# Patient Record
Sex: Female | Born: 1950 | Race: White | Smoking: Former smoker
Health system: Southern US, Community
[De-identification: ages and names within clinical notes are randomized; demographics above are authoritative.]

## PROBLEM LIST (undated history)

## (undated) DIAGNOSIS — J189 Pneumonia, unspecified organism: Secondary | ICD-10-CM

## (undated) DIAGNOSIS — I499 Cardiac arrhythmia, unspecified: Secondary | ICD-10-CM

## (undated) DIAGNOSIS — G4730 Sleep apnea, unspecified: Secondary | ICD-10-CM

## (undated) DIAGNOSIS — M1990 Unspecified osteoarthritis, unspecified site: Secondary | ICD-10-CM

## (undated) DIAGNOSIS — E039 Hypothyroidism, unspecified: Secondary | ICD-10-CM

## (undated) DIAGNOSIS — D563 Thalassemia minor: Secondary | ICD-10-CM

## (undated) DIAGNOSIS — T4145XA Adverse effect of unspecified anesthetic, initial encounter: Secondary | ICD-10-CM

## (undated) DIAGNOSIS — F32A Depression, unspecified: Secondary | ICD-10-CM

## (undated) DIAGNOSIS — E119 Type 2 diabetes mellitus without complications: Secondary | ICD-10-CM

## (undated) DIAGNOSIS — I10 Essential (primary) hypertension: Secondary | ICD-10-CM

## (undated) DIAGNOSIS — K219 Gastro-esophageal reflux disease without esophagitis: Secondary | ICD-10-CM

## (undated) DIAGNOSIS — R519 Headache, unspecified: Secondary | ICD-10-CM

## (undated) DIAGNOSIS — J45909 Unspecified asthma, uncomplicated: Secondary | ICD-10-CM

## (undated) DIAGNOSIS — M041 Periodic fever syndromes: Secondary | ICD-10-CM

## (undated) DIAGNOSIS — D560 Alpha thalassemia: Secondary | ICD-10-CM

## (undated) DIAGNOSIS — R112 Nausea with vomiting, unspecified: Secondary | ICD-10-CM

## (undated) DIAGNOSIS — F329 Major depressive disorder, single episode, unspecified: Secondary | ICD-10-CM

## (undated) DIAGNOSIS — Z8719 Personal history of other diseases of the digestive system: Secondary | ICD-10-CM

## (undated) DIAGNOSIS — T8859XA Other complications of anesthesia, initial encounter: Secondary | ICD-10-CM

## (undated) HISTORY — PX: LEG AMPUTATION: SHX1105

## (undated) HISTORY — PX: ARTHROSCOPY KNEE W/ DRILLING: SUR92

## (undated) HISTORY — PX: HERNIA REPAIR: SHX51

## (undated) HISTORY — PX: KNEE SURGERY: SHX244

## (undated) HISTORY — PX: APPENDECTOMY: SHX54

## (undated) HISTORY — PX: CYST EXCISION: SHX5701

## (undated) HISTORY — PX: TOTAL SHOULDER ARTHROPLASTY: SHX126

## (undated) HISTORY — PX: TONSILLECTOMY: SUR1361

## (undated) HISTORY — PX: THIGH FASCIOTOMY: SHX2495

## (undated) HISTORY — PX: CHOLECYSTECTOMY: SHX55

---
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
## 1973-08-20 HISTORY — PX: CHOLECYSTECTOMY: SHX55

## 1977-08-20 DIAGNOSIS — Z9889 Other specified postprocedural states: Secondary | ICD-10-CM

## 1977-08-20 DIAGNOSIS — R112 Nausea with vomiting, unspecified: Secondary | ICD-10-CM

## 1977-08-20 HISTORY — DX: Other specified postprocedural states: R11.2

## 1977-08-20 HISTORY — DX: Nausea with vomiting, unspecified: Z98.890

## 1983-08-21 HISTORY — PX: EXPLORATORY LAPAROTOMY: SUR591

## 1983-08-21 HISTORY — PX: LAPAROTOMY: SHX154

## 2004-05-20 HISTORY — PX: REPLACEMENT TOTAL KNEE: SUR1224

## 2010-08-20 HISTORY — PX: LEG AMPUTATION: SHX1105

## 2019-01-27 ENCOUNTER — Other Ambulatory Visit: Payer: Self-pay | Admitting: Orthopaedic Surgery

## 2019-01-27 DIAGNOSIS — G8929 Other chronic pain: Secondary | ICD-10-CM

## 2019-01-27 DIAGNOSIS — M25512 Pain in left shoulder: Secondary | ICD-10-CM

## 2019-02-02 ENCOUNTER — Ambulatory Visit
Admission: RE | Admit: 2019-02-02 | Discharge: 2019-02-02 | Disposition: A | Discharge status: Home / Self Care | Payer: Medicare Other | Source: Ambulatory Visit | Attending: Orthopaedic Surgery | Admitting: Orthopaedic Surgery

## 2019-02-02 DIAGNOSIS — M25512 Pain in left shoulder: Secondary | ICD-10-CM

## 2019-02-02 DIAGNOSIS — G8929 Other chronic pain: Secondary | ICD-10-CM

## 2019-02-03 NOTE — Progress Notes (Signed)
Spoke with Judeen Hammans to request orders in epic.

## 2019-02-04 NOTE — Patient Instructions (Addendum)
Shirley SpragueBeverly Mcdonald    Your procedure is scheduled on: 02-11-2019  [facility]    Report to admitting at 955 AM   YOU NEED TO HAVE A COVID 19 TEST ON 02-07-19  @11 :05 AM, THIS TEST MUST BE DONE BEFORE SURGERY, [facility]. ONCE YOUR COVID TEST IS COMPLETED, PLEASE BEGIN THE QUARANTINE INSTRUCTIONS AS OUTLINED IN YOUR HANDOUT.   Call this number if you have problems the morning of surgery [phone]    Remember: BRUSH YOUR TEETH MORNING OF SURGERY AND RINSE YOUR MOUTH OUT, NO CHEWING GUM CANDY OR MINTS.   NO SOLID FOOD AFTER MIDNIGHT THE NIGHT PRIOR TO SURGERY. NOTHING BY MOUTH EXCEPT CLEAR LIQUIDS UNTIL 3 HOURS PRIOR TO SCHEULED SURGERY. PLEASE FINISH G2 DRINK PER SURGEON ORDER 3 HOURS PRIOR TO SCHEDULED SURGERY TIME WHICH NEEDS TO BE COMPLETED AT 925 AM.      CLEAR LIQUID DIET   Foods Allowed                                                                     Foods Excluded  Coffee and tea, regular and decaf                             liquids that you cannot  Plain Jell-O in any flavor                                             see through such as: Fruit ices (not with fruit pulp)                                     milk, soups, orange juice  Iced Popsicles                                    All solid food Carbonated beverages, regular and diet                                    Cranberry, grape and apple juices Sports drinks like Gatorade Lightly seasoned clear broth or consume(fat free) Sugar, honey syrup  Sample Menu Breakfast                                Lunch                                     Supper Cranberry juice                    Beef broth  Chicken broth Jell-O                                     Grape juice                           Apple juice Coffee or tea                        Jell-O                                      Popsicle                                                 Coffee or tea                        Coffee or tea  _____________________________________________________________________   Take these medicines the morning of surgery with A SIP OF WATER: LAMOTRIGENEN (LAMICTAL), PREGABALIN (LYRICA), BUPROPION (WELLBUTRIN), VENLAFAXINE XR ( EFFEXOR), PROPRANOL ER, COLCHICINE, LEVOTHYROXINE (SYNTHROID). YOU MAY BRING AND USE YOUR INHALER  DO NOT TAKE ANY DIABETIC MEDICATIONS DAY OF YOUR SURGERY       Reviewed and Endorsed by [facility] Patient Education Committee, August 2015                             You may not have any metal on your body including hair pins and              piercings  Do not wear jewelry, make-up, lotions, powders or perfumes, deodorant             Do not wear nail polish.  Do not shave  48 hours prior to surgery.                Do not bring valuables to the hospital. [facility] IS NOT             RESPONSIBLE   FOR VALUABLES.  Contacts, dentures or bridgework may not be worn into surgery.  Leave suitcase in the car. After surgery it may be brought to your room.     _____________________________________________________________________  How to Manage Your Diabetes Before and After Surgery  Why is it important to control my blood sugar before and after surgery? . Improving blood sugar levels before and after surgery helps healing and can limit problems. . A way of improving blood sugar control is eating a healthy diet by: o  Eating less sugar and carbohydrates o  Increasing activity/exercise o  Talking with your doctor about reaching your blood sugar goals . High blood sugars (greater than 180 mg/dL) can raise your risk of infections and slow your recovery, so you will need to focus on controlling your diabetes during the weeks before surgery. . Make sure that the doctor who takes care of your diabetes knows about your planned surgery including the date and location.  How do I manage my blood  sugar before surgery? . Check your blood sugar at least 4 times a day, starting 2 days  before surgery, to make sure that the level is not too high or low. o Check your blood sugar the morning of your surgery when you wake up and every 2 hours until you get to the Short Stay unit. . If your blood sugar is less than 70 mg/dL, you will need to treat for low blood sugar: o Do not take insulin. o Treat a low blood sugar (less than 70 mg/dL) with  cup of clear juice (cranberry or apple), 4 glucose tablets, OR glucose gel. o Recheck blood sugar in 15 minutes after treatment (to make sure it is greater than 70 mg/dL). If your blood sugar is not greater than 70 mg/dL on recheck, call [phone] for further instructions. . Report your blood sugar to the short stay nurse when you get to Short Stay.  . If you are admitted to the hospital after surgery: o Your blood sugar will be checked by the staff and you will probably be given insulin after surgery (instead of oral diabetes medicines) to make sure you have good blood sugar levels. o The goal for blood sugar control after surgery is 80-180 mg/dL.   WHAT DO I DO ABOUT MY DIABETES MEDICATION?  Marland Kitchen Do not take oral diabetes medicines (pills) the morning of surgery.  THE DAY  BEFORE SURGERY TAKE FULL MORNING LEVEMIR INSULIN DOSE, TAKE 1/2  EVENING LEVEMIR DOSE . THE MORNING OF SURGERY TAKE 1/2 DOSE OF LEVEMIR INSULIN  . The day before surgery take metformin as usual . The day of surgery do not take metformin .   THE DAY BEFORE SURGERY TAKE  regular meal times doses of lispro insulin, do not take bedtime dose of lispro (humalog)  .  the morning of surgery if CBG is greater than 220 mg/dL, you may take  of your sliding scale  . (correction) dose of lispro(humalog)  insulin               [place] - Preparing for Surgery Before surgery, you can play an important role.  Because skin is not sterile, your skin needs to be as free of germs as possible.   You can reduce the number of germs on your skin by washing with CHG (chlorahexidine gluconate) soap before surgery.  CHG is an antiseptic cleaner which kills germs and bonds with the skin to continue killing germs even after washing. Please DO NOT use if you have an allergy to CHG or antibacterial soaps.  If your skin becomes reddened/irritated stop using the CHG and inform your nurse when you arrive at Short Stay. Do not shave (including legs and underarms) for at least 48 hours prior to the first CHG shower.  You may shave your face/neck. Please follow these instructions carefully:  1.  Shower with CHG Soap the night before surgery and the  morning of Surgery.  2.  If you choose to wash your hair, wash your hair first as usual with your  normal  shampoo.  3.  After you shampoo, rinse your hair and body thoroughly to remove the  shampoo.                           4.  Use CHG as you would any other liquid soap.  You can apply chg directly  to the skin and wash                       Gently with  a scrungie or clean washcloth.  5.  Apply the CHG Soap to your body ONLY FROM THE NECK DOWN.   Do not use on face/ open                           Wound or open sores. Avoid contact with eyes, ears mouth and genitals (private parts).                       Wash face,  Genitals (private parts) with your normal soap.             6.  Wash thoroughly, paying special attention to the area where your surgery  will be performed.  7.  Thoroughly rinse your body with warm water from the neck down.  8.  DO NOT shower/wash with your normal soap after using and rinsing off  the CHG Soap.                9.  Pat yourself dry with a clean towel.            10.  Wear clean pajamas.            11.  Place clean sheets on your bed the night of your first shower and do not  sleep with pets. Day of Surgery : Do not apply any lotions/deodorants the morning of surgery.  Please wear clean clothes to the hospital/surgery  center.  FAILURE TO FOLLOW THESE INSTRUCTIONS MAY RESULT IN THE CANCELLATION OF YOUR SURGERY PATIENT SIGNATURE_________________________________  NURSE SIGNATURE__________________________________  ________________________________________________________________________   Rogelia MireIncentive Spirometer  An incentive spirometer is a tool that can help keep your lungs clear and active. This tool measures how well you are filling your lungs with each breath. Taking long deep breaths may help reverse or decrease the chance of developing breathing (pulmonary) problems (especially infection) following:  A long period of time when you are unable to move or be active. BEFORE THE PROCEDURE   If the spirometer includes an indicator to show your best effort, your nurse or respiratory therapist will set it to a desired goal.  If possible, sit up straight or lean slightly forward. Try not to slouch.  Hold the incentive spirometer in an upright position. INSTRUCTIONS FOR USE  1. Sit on the edge of your bed if possible, or sit up as far as you can in bed or on a chair. 2. Hold the incentive spirometer in an upright position. 3. Breathe out normally. 4. Place the mouthpiece in your mouth and seal your lips tightly around it. 5. Breathe in slowly and as deeply as possible, raising the piston or the ball toward the top of the column. 6. Hold your breath for 3-5 seconds or for as long as possible. Allow the piston or ball to fall to the bottom of the column. 7. Remove the mouthpiece from your mouth and breathe out normally. 8. Rest for a few seconds and repeat Steps 1 through 7 at least 10 times every 1-2 hours when you are awake. Take your time and take a few normal breaths between deep breaths. 9. The spirometer may include an indicator to show your best effort. Use the indicator as a goal to work toward during each repetition. 10. After each set of 10 deep breaths, practice coughing to be sure your lungs are  clear. If you have an incision (the cut made at the time of surgery), support  your incision when coughing by placing a pillow or rolled up towels firmly against it. Once you are able to get out of bed, walk around indoors and cough well. You may stop using the incentive spirometer when instructed by your caregiver.  RISKS AND COMPLICATIONS  Take your time so you do not get dizzy or light-headed.  If you are in pain, you may need to take or ask for pain medication before doing incentive spirometry. It is harder to take a deep breath if you are having pain. AFTER USE  Rest and breathe slowly and easily.  It can be helpful to keep track of a log of your progress. Your caregiver can provide you with a simple table to help with this. If you are using the spirometer at home, follow these instructions: [place] IF:   You are having difficultly using the spirometer.  You have trouble using the spirometer as often as instructed.  Your pain medication is not giving enough relief while using the spirometer.  You develop fever of 100.5 F (38.1 C) or higher. SEEK IMMEDIATE MEDICAL CARE IF:   You cough up bloody sputum that had not been present before.  You develop fever of 102 F (38.9 C) or greater.  You develop worsening pain at or near the incision site. MAKE SURE YOU:   Understand these instructions.  Will watch your condition.  Will get help right away if you are not doing well or get worse. Document Released: 12/17/2006 Document Revised: 10/29/2011 Document Reviewed: 02/17/2007 [facility] Patient Information 2014 [place], [place].   ________________________________________________________________________

## 2019-02-05 ENCOUNTER — Other Ambulatory Visit: Payer: Self-pay

## 2019-02-05 ENCOUNTER — Other Ambulatory Visit (HOSPITAL_COMMUNITY): Payer: Medicare Other

## 2019-02-05 ENCOUNTER — Encounter (HOSPITAL_COMMUNITY)
Admission: RE | Admit: 2019-02-05 | Discharge: 2019-02-05 | Disposition: A | Discharge status: Home / Self Care | Payer: Medicare Other | Source: Ambulatory Visit | Attending: Orthopaedic Surgery | Admitting: Orthopaedic Surgery

## 2019-02-05 ENCOUNTER — Encounter (HOSPITAL_COMMUNITY): Payer: Self-pay

## 2019-02-05 DIAGNOSIS — Z01818 Encounter for other preprocedural examination: Secondary | ICD-10-CM | POA: Diagnosis not present

## 2019-02-05 DIAGNOSIS — S42202A Unspecified fracture of upper end of left humerus, initial encounter for closed fracture: Secondary | ICD-10-CM | POA: Insufficient documentation

## 2019-02-05 DIAGNOSIS — E119 Type 2 diabetes mellitus without complications: Secondary | ICD-10-CM | POA: Diagnosis not present

## 2019-02-05 DIAGNOSIS — I10 Essential (primary) hypertension: Secondary | ICD-10-CM | POA: Diagnosis not present

## 2019-02-05 DIAGNOSIS — Z1159 Encounter for screening for other viral diseases: Secondary | ICD-10-CM | POA: Insufficient documentation

## 2019-02-05 DIAGNOSIS — X58XXXA Exposure to other specified factors, initial encounter: Secondary | ICD-10-CM | POA: Diagnosis not present

## 2019-02-05 HISTORY — DX: Other complications of anesthesia, initial encounter: T88.59XA

## 2019-02-05 HISTORY — DX: Alpha thalassemia: D56.0

## 2019-02-05 HISTORY — DX: Sleep apnea, unspecified: G47.30

## 2019-02-05 HISTORY — DX: Hypothyroidism, unspecified: E03.9

## 2019-02-05 HISTORY — DX: Headache, unspecified: R51.9

## 2019-02-05 HISTORY — DX: Essential (primary) hypertension: I10

## 2019-02-05 HISTORY — DX: Depression, unspecified: F32.A

## 2019-02-05 HISTORY — DX: Type 2 diabetes mellitus without complications: E11.9

## 2019-02-05 HISTORY — DX: Unspecified osteoarthritis, unspecified site: M19.90

## 2019-02-05 LAB — SURGICAL PCR SCREEN
MRSA, PCR: NEGATIVE
Staphylococcus aureus: POSITIVE — AB

## 2019-02-05 LAB — HEMOGLOBIN A1C
Hgb A1c MFr Bld: 4.5 % — ABNORMAL LOW (ref 4.8–5.6)
Mean Plasma Glucose: 82.45 mg/dL

## 2019-02-05 LAB — CBC
HCT: 32.7 % — ABNORMAL LOW (ref 36.0–46.0)
Hemoglobin: 10.8 g/dL — ABNORMAL LOW (ref 12.0–15.0)
MCH: 27.7 pg (ref 26.0–34.0)
MCHC: 33 g/dL (ref 30.0–36.0)
MCV: 83.8 fL (ref 80.0–100.0)
Platelets: 221 10*3/uL (ref 150–400)
RBC: 3.9 MIL/uL (ref 3.87–5.11)
RDW: 17.1 % — ABNORMAL HIGH (ref 11.5–15.5)
WBC: 7.5 10*3/uL (ref 4.0–10.5)
nRBC: 0 % (ref 0.0–0.2)

## 2019-02-05 LAB — GLUCOSE, CAPILLARY: Glucose-Capillary: 166 mg/dL — ABNORMAL HIGH (ref 70–99)

## 2019-02-05 LAB — BASIC METABOLIC PANEL
Anion gap: 11 (ref 5–15)
BUN: 12 mg/dL (ref 8–23)
CO2: 27 mmol/L (ref 22–32)
Calcium: 9.4 mg/dL (ref 8.9–10.3)
Chloride: 99 mmol/L (ref 98–111)
Creatinine, Ser: 0.46 mg/dL (ref 0.44–1.00)
GFR calc Af Amer: >60 mL/min (ref >60)
GFR calc non Af Amer: >60 mL/min (ref >60)
Glucose, Bld: 133 mg/dL — ABNORMAL HIGH (ref 70–99)
Potassium: 3.4 mmol/L — ABNORMAL LOW (ref 3.5–5.1)
Sodium: 137 mmol/L (ref 135–145)

## 2019-02-06 NOTE — Progress Notes (Signed)
02-05-19 PCR result routed to Dr. Griffin Basil for review

## 2019-02-07 ENCOUNTER — Other Ambulatory Visit (HOSPITAL_COMMUNITY)
Admission: RE | Admit: 2019-02-07 | Discharge: 2019-02-07 | Disposition: A | Discharge status: Home / Self Care | Payer: Medicare Other | Source: Ambulatory Visit | Attending: Orthopaedic Surgery | Admitting: Orthopaedic Surgery

## 2019-02-07 DIAGNOSIS — Z01818 Encounter for other preprocedural examination: Secondary | ICD-10-CM | POA: Diagnosis not present

## 2019-02-07 LAB — SARS CORONAVIRUS 2 (TAT 6-24 HRS): SARS Coronavirus 2: NEGATIVE

## 2019-02-11 ENCOUNTER — Encounter (HOSPITAL_COMMUNITY)
Admission: RE | Disposition: A | Discharge status: Home Health | Payer: Self-pay | Source: Home / Self Care | Attending: Orthopaedic Surgery

## 2019-02-11 ENCOUNTER — Inpatient Hospital Stay (HOSPITAL_COMMUNITY)
Admission: RE | Admit: 2019-02-11 | Discharge: 2019-02-12 | DRG: 483 | Disposition: A | Discharge status: Home Health | Payer: Medicare Other | Attending: Orthopaedic Surgery | Admitting: Orthopaedic Surgery

## 2019-02-11 ENCOUNTER — Inpatient Hospital Stay (HOSPITAL_COMMUNITY): Payer: Medicare Other | Admitting: Anesthesiology

## 2019-02-11 ENCOUNTER — Inpatient Hospital Stay (HOSPITAL_COMMUNITY): Payer: Medicare Other

## 2019-02-11 ENCOUNTER — Inpatient Hospital Stay (HOSPITAL_COMMUNITY): Payer: Medicare Other | Admitting: Physician Assistant

## 2019-02-11 ENCOUNTER — Other Ambulatory Visit: Payer: Self-pay

## 2019-02-11 ENCOUNTER — Encounter (HOSPITAL_COMMUNITY): Payer: Self-pay | Admitting: *Deleted

## 2019-02-11 DIAGNOSIS — S42202A Unspecified fracture of upper end of left humerus, initial encounter for closed fracture: Principal | ICD-10-CM | POA: Diagnosis present

## 2019-02-11 DIAGNOSIS — M659 Synovitis and tenosynovitis, unspecified: Secondary | ICD-10-CM | POA: Diagnosis present

## 2019-02-11 DIAGNOSIS — G4730 Sleep apnea, unspecified: Secondary | ICD-10-CM | POA: Diagnosis present

## 2019-02-11 DIAGNOSIS — D560 Alpha thalassemia: Secondary | ICD-10-CM | POA: Diagnosis present

## 2019-02-11 DIAGNOSIS — F329 Major depressive disorder, single episode, unspecified: Secondary | ICD-10-CM | POA: Diagnosis present

## 2019-02-11 DIAGNOSIS — E039 Hypothyroidism, unspecified: Secondary | ICD-10-CM | POA: Diagnosis present

## 2019-02-11 DIAGNOSIS — Z9049 Acquired absence of other specified parts of digestive tract: Secondary | ICD-10-CM

## 2019-02-11 DIAGNOSIS — Z791 Long term (current) use of non-steroidal anti-inflammatories (NSAID): Secondary | ICD-10-CM | POA: Diagnosis not present

## 2019-02-11 DIAGNOSIS — E119 Type 2 diabetes mellitus without complications: Secondary | ICD-10-CM | POA: Diagnosis present

## 2019-02-11 DIAGNOSIS — Z794 Long term (current) use of insulin: Secondary | ICD-10-CM

## 2019-02-11 DIAGNOSIS — Z7951 Long term (current) use of inhaled steroids: Secondary | ICD-10-CM

## 2019-02-11 DIAGNOSIS — Z87891 Personal history of nicotine dependence: Secondary | ICD-10-CM | POA: Diagnosis not present

## 2019-02-11 DIAGNOSIS — E11649 Type 2 diabetes mellitus with hypoglycemia without coma: Secondary | ICD-10-CM | POA: Diagnosis present

## 2019-02-11 DIAGNOSIS — Z7982 Long term (current) use of aspirin: Secondary | ICD-10-CM

## 2019-02-11 DIAGNOSIS — Z79890 Hormone replacement therapy (postmenopausal): Secondary | ICD-10-CM | POA: Diagnosis not present

## 2019-02-11 DIAGNOSIS — Z89611 Acquired absence of right leg above knee: Secondary | ICD-10-CM | POA: Diagnosis not present

## 2019-02-11 DIAGNOSIS — I10 Essential (primary) hypertension: Secondary | ICD-10-CM | POA: Diagnosis present

## 2019-02-11 DIAGNOSIS — X58XXXA Exposure to other specified factors, initial encounter: Secondary | ICD-10-CM | POA: Diagnosis present

## 2019-02-11 DIAGNOSIS — Z79899 Other long term (current) drug therapy: Secondary | ICD-10-CM | POA: Diagnosis not present

## 2019-02-11 DIAGNOSIS — M25519 Pain in unspecified shoulder: Secondary | ICD-10-CM

## 2019-02-11 DIAGNOSIS — Z09 Encounter for follow-up examination after completed treatment for conditions other than malignant neoplasm: Secondary | ICD-10-CM

## 2019-02-11 HISTORY — PX: TOTAL SHOULDER ARTHROPLASTY: SHX126

## 2019-02-11 HISTORY — PX: REVERSE SHOULDER ARTHROPLASTY: SHX5054

## 2019-02-11 LAB — GLUCOSE, CAPILLARY
Glucose-Capillary: 175 mg/dL — ABNORMAL HIGH (ref 70–99)
Glucose-Capillary: 156 mg/dL — ABNORMAL HIGH (ref 70–99)
Glucose-Capillary: 280 mg/dL — ABNORMAL HIGH (ref 70–99)

## 2019-02-11 SURGERY — ARTHROPLASTY, SHOULDER, TOTAL, REVERSE
Anesthesia: General | Site: Shoulder | Laterality: Left

## 2019-02-11 MED ORDER — DOCUSATE SODIUM 100 MG PO CAPS
100.0000 mg | ORAL_CAPSULE | Freq: Two times a day (BID) | ORAL | Status: DC
  Administered 2019-02-11 – 2019-02-12 (×2): 100 mg via ORAL
  Filled 2019-02-11 (×2): qty 1

## 2019-02-11 MED ORDER — SCOPOLAMINE 1 MG/3DAYS TD PT72: 1.0000 | MEDICATED_PATCH | Freq: Once | TRANSDERMAL | Status: DC

## 2019-02-11 MED ORDER — METFORMIN HCL ER 500 MG PO TB24
500.0000 mg | ORAL_TABLET | Freq: Two times a day (BID) | ORAL | Status: DC
  Administered 2019-02-12: 500 mg via ORAL
  Filled 2019-02-11: qty 1

## 2019-02-11 MED ORDER — BUPIVACAINE HCL (PF) 0.5 % IJ SOLN
INTRAMUSCULAR | Status: DC | PRN
  Administered 2019-02-11: 15 mL via PERINEURAL

## 2019-02-11 MED ORDER — SODIUM CHLORIDE 0.9 % IV SOLN
INTRAVENOUS | Status: DC | PRN
  Administered 2019-02-11: 35 ug/min via INTRAVENOUS

## 2019-02-11 MED ORDER — METOCLOPRAMIDE HCL 5 MG/ML IJ SOLN: 5.0000 mg | Freq: Three times a day (TID) | INTRAMUSCULAR | Status: DC | PRN

## 2019-02-11 MED ORDER — VANCOMYCIN HCL IN DEXTROSE 1-5 GM/200ML-% IV SOLN
1000.0000 mg | Freq: Once | INTRAVENOUS | Status: AC
  Administered 2019-02-11: 1000 mg via INTRAVENOUS

## 2019-02-11 MED ORDER — ONDANSETRON HCL 4 MG/2ML IJ SOLN: 4.0000 mg | Freq: Four times a day (QID) | INTRAMUSCULAR | Status: DC | PRN

## 2019-02-11 MED ORDER — LOSARTAN POTASSIUM 50 MG PO TABS
100.0000 mg | ORAL_TABLET | Freq: Every day | ORAL | Status: DC
  Administered 2019-02-12: 100 mg via ORAL
  Filled 2019-02-11: qty 2

## 2019-02-11 MED ORDER — BUPROPION HCL ER (XL) 300 MG PO TB24
300.0000 mg | ORAL_TABLET | Freq: Every day | ORAL | Status: DC
  Administered 2019-02-12: 11:00:00 300 mg via ORAL
  Filled 2019-02-11: qty 1

## 2019-02-11 MED ORDER — LIDOCAINE 2% (20 MG/ML) 5 ML SYRINGE
INTRAMUSCULAR | Status: DC | PRN
  Administered 2019-02-11: 20 mg via INTRAVENOUS

## 2019-02-11 MED ORDER — IBUPROFEN 400 MG PO TABS: 800.0000 mg | ORAL_TABLET | Freq: Four times a day (QID) | ORAL | Status: DC | PRN

## 2019-02-11 MED ORDER — SUGAMMADEX SODIUM 200 MG/2ML IV SOLN
INTRAVENOUS | Status: AC
  Filled 2019-02-11: qty 2

## 2019-02-11 MED ORDER — ONDANSETRON HCL 4 MG/2ML IJ SOLN
INTRAMUSCULAR | Status: AC
  Filled 2019-02-11: qty 2

## 2019-02-11 MED ORDER — ADULT MULTIVITAMIN W/MINERALS CH
1.0000 | ORAL_TABLET | Freq: Every day | ORAL | Status: DC
  Administered 2019-02-12: 11:00:00 1 via ORAL
  Filled 2019-02-11: qty 1

## 2019-02-11 MED ORDER — PANTOPRAZOLE SODIUM 40 MG PO TBEC
80.0000 mg | DELAYED_RELEASE_TABLET | Freq: Every day | ORAL | Status: DC
  Administered 2019-02-12: 80 mg via ORAL
  Filled 2019-02-11: qty 2

## 2019-02-11 MED ORDER — ROCURONIUM BROMIDE 50 MG/5ML IV SOSY
PREFILLED_SYRINGE | INTRAVENOUS | Status: DC | PRN
  Administered 2019-02-11: 50 mg via INTRAVENOUS

## 2019-02-11 MED ORDER — 0.9 % SODIUM CHLORIDE (POUR BTL) OPTIME
TOPICAL | Status: DC | PRN
  Administered 2019-02-11: 15:00:00 1000 mL

## 2019-02-11 MED ORDER — TRAZODONE HCL 50 MG PO TABS
50.0000 mg | ORAL_TABLET | Freq: Every day | ORAL | Status: DC
  Administered 2019-02-11: 50 mg via ORAL
  Filled 2019-02-11: qty 1

## 2019-02-11 MED ORDER — INSULIN DETEMIR 100 UNIT/ML ~~LOC~~ SOLN
5.0000 [IU] | Freq: Two times a day (BID) | SUBCUTANEOUS | Status: DC
  Administered 2019-02-12 (×2): 5 [IU] via SUBCUTANEOUS
  Filled 2019-02-11 (×3): qty 0.05

## 2019-02-11 MED ORDER — ASPIRIN BUF(CACARB-MGCARB-MGO) 81 MG PO TABS: 81.0000 mg | ORAL_TABLET | Freq: Every day | ORAL | Status: DC

## 2019-02-11 MED ORDER — SUGAMMADEX SODIUM 200 MG/2ML IV SOLN
INTRAVENOUS | Status: DC | PRN
  Administered 2019-02-11: 180 mg via INTRAVENOUS

## 2019-02-11 MED ORDER — LIDOCAINE 2% (20 MG/ML) 5 ML SYRINGE
INTRAMUSCULAR | Status: AC
  Filled 2019-02-11: qty 5

## 2019-02-11 MED ORDER — STERILE WATER FOR IRRIGATION IR SOLN
Status: DC | PRN
  Administered 2019-02-11: 2000 mL

## 2019-02-11 MED ORDER — SODIUM CHLORIDE 0.9 % IR SOLN
Status: DC | PRN
  Administered 2019-02-11: 1000 mL

## 2019-02-11 MED ORDER — INSULIN LISPRO (1 UNIT DIAL) 100 UNIT/ML (KWIKPEN): 1.0000 [IU] | PEN_INJECTOR | Freq: Four times a day (QID) | SUBCUTANEOUS | Status: DC

## 2019-02-11 MED ORDER — FENTANYL CITRATE (PF) 100 MCG/2ML IJ SOLN
INTRAMUSCULAR | Status: AC
  Filled 2019-02-11: qty 2

## 2019-02-11 MED ORDER — ALBUTEROL SULFATE (2.5 MG/3ML) 0.083% IN NEBU: 2.5000 mg | INHALATION_SOLUTION | Freq: Four times a day (QID) | RESPIRATORY_TRACT | Status: DC | PRN

## 2019-02-11 MED ORDER — PROMETHAZINE HCL 25 MG/ML IJ SOLN: 6.2500 mg | INTRAMUSCULAR | Status: DC | PRN

## 2019-02-11 MED ORDER — TOBRAMYCIN SULFATE 1.2 G IJ SOLR
INTRAMUSCULAR | Status: AC
  Filled 2019-02-11: qty 1.2

## 2019-02-11 MED ORDER — DEXAMETHASONE SODIUM PHOSPHATE 10 MG/ML IJ SOLN
INTRAMUSCULAR | Status: AC
  Filled 2019-02-11: qty 1

## 2019-02-11 MED ORDER — PHENYLEPHRINE 40 MCG/ML (10ML) SYRINGE FOR IV PUSH (FOR BLOOD PRESSURE SUPPORT)
PREFILLED_SYRINGE | INTRAVENOUS | Status: DC | PRN
  Administered 2019-02-11 (×2): 80 ug via INTRAVENOUS

## 2019-02-11 MED ORDER — MIDAZOLAM HCL 2 MG/2ML IJ SOLN
INTRAMUSCULAR | Status: AC
  Filled 2019-02-11: qty 2

## 2019-02-11 MED ORDER — ATORVASTATIN CALCIUM 20 MG PO TABS
20.0000 mg | ORAL_TABLET | Freq: Every evening | ORAL | Status: DC
  Administered 2019-02-11: 20 mg via ORAL
  Filled 2019-02-11: qty 1

## 2019-02-11 MED ORDER — LAMOTRIGINE 25 MG PO TABS
150.0000 mg | ORAL_TABLET | Freq: Every day | ORAL | Status: DC
  Administered 2019-02-11: 150 mg via ORAL
  Filled 2019-02-11: qty 1

## 2019-02-11 MED ORDER — FENTANYL CITRATE (PF) 100 MCG/2ML IJ SOLN
50.0000 ug | Freq: Once | INTRAMUSCULAR | Status: AC
  Administered 2019-02-11: 50 ug via INTRAVENOUS

## 2019-02-11 MED ORDER — ACETAMINOPHEN 500 MG PO TABS
1000.0000 mg | ORAL_TABLET | Freq: Three times a day (TID) | ORAL | Status: DC
  Administered 2019-02-11 – 2019-02-12 (×2): 1000 mg via ORAL
  Filled 2019-02-11 (×2): qty 2

## 2019-02-11 MED ORDER — VANCOMYCIN HCL POWD
Status: DC | PRN
  Administered 2019-02-11: 1000 mg via TOPICAL

## 2019-02-11 MED ORDER — LOSARTAN POTASSIUM-HCTZ 100-25 MG PO TABS: 1.0000 | ORAL_TABLET | Freq: Every day | ORAL | Status: DC

## 2019-02-11 MED ORDER — PREGABALIN 75 MG PO CAPS
150.0000 mg | ORAL_CAPSULE | Freq: Two times a day (BID) | ORAL | Status: DC
  Administered 2019-02-11 – 2019-02-12 (×2): 150 mg via ORAL
  Filled 2019-02-11 (×2): qty 2

## 2019-02-11 MED ORDER — BUPIVACAINE LIPOSOME 1.3 % IJ SUSP
INTRAMUSCULAR | Status: DC | PRN
  Administered 2019-02-11: 10 mL via PERINEURAL

## 2019-02-11 MED ORDER — VENLAFAXINE HCL ER 150 MG PO CP24
300.0000 mg | ORAL_CAPSULE | Freq: Every day | ORAL | Status: DC
  Administered 2019-02-12: 300 mg via ORAL
  Filled 2019-02-11: qty 2

## 2019-02-11 MED ORDER — CELECOXIB 200 MG PO CAPS
200.0000 mg | ORAL_CAPSULE | Freq: Two times a day (BID) | ORAL | Status: DC
  Administered 2019-02-11 – 2019-02-12 (×2): 200 mg via ORAL
  Filled 2019-02-11 (×2): qty 1

## 2019-02-11 MED ORDER — VITAMIN D 25 MCG (1000 UNIT) PO TABS: 5000.0000 [IU] | ORAL_TABLET | Freq: Every evening | ORAL | Status: DC

## 2019-02-11 MED ORDER — KRILL OIL 500 MG PO CAPS: 500.0000 mg | ORAL_CAPSULE | Freq: Every day | ORAL | Status: DC

## 2019-02-11 MED ORDER — TRANEXAMIC ACID-NACL 1000-0.7 MG/100ML-% IV SOLN
1000.0000 mg | INTRAVENOUS | Status: AC
  Administered 2019-02-11: 1000 mg via INTRAVENOUS
  Filled 2019-02-11: qty 100

## 2019-02-11 MED ORDER — LACTATED RINGERS IV SOLN
INTRAVENOUS | Status: DC
  Administered 2019-02-11 (×2): via INTRAVENOUS

## 2019-02-11 MED ORDER — SUCCINYLCHOLINE CHLORIDE 200 MG/10ML IV SOSY
PREFILLED_SYRINGE | INTRAVENOUS | Status: DC | PRN
  Administered 2019-02-11: 100 mg via INTRAVENOUS

## 2019-02-11 MED ORDER — ONDANSETRON HCL 4 MG PO TABS: 4.0000 mg | ORAL_TABLET | Freq: Four times a day (QID) | ORAL | Status: DC | PRN

## 2019-02-11 MED ORDER — VANCOMYCIN HCL IN DEXTROSE 1-5 GM/200ML-% IV SOLN
INTRAVENOUS | Status: AC
  Filled 2019-02-11: qty 200

## 2019-02-11 MED ORDER — ONDANSETRON HCL 4 MG/2ML IJ SOLN
INTRAMUSCULAR | Status: DC | PRN
  Administered 2019-02-11: 4 mg via INTRAVENOUS

## 2019-02-11 MED ORDER — TOBRAMYCIN SULFATE 1.2 G IJ SOLR
INTRAMUSCULAR | Status: DC | PRN
  Administered 2019-02-11: 1.2 g via TOPICAL

## 2019-02-11 MED ORDER — MIDAZOLAM HCL 2 MG/2ML IJ SOLN
1.0000 mg | Freq: Once | INTRAMUSCULAR | Status: AC
  Administered 2019-02-11: 1 mg via INTRAVENOUS

## 2019-02-11 MED ORDER — INSULIN ASPART 100 UNIT/ML ~~LOC~~ SOLN
0.0000 [IU] | Freq: Three times a day (TID) | SUBCUTANEOUS | Status: DC
  Administered 2019-02-12: 4 [IU] via SUBCUTANEOUS
  Administered 2019-02-12: 3 [IU] via SUBCUTANEOUS

## 2019-02-11 MED ORDER — CEFAZOLIN SODIUM-DEXTROSE 2-4 GM/100ML-% IV SOLN
2.0000 g | INTRAVENOUS | Status: AC
  Administered 2019-02-11: 2 g via INTRAVENOUS
  Filled 2019-02-11: qty 100

## 2019-02-11 MED ORDER — MEPERIDINE HCL 50 MG/ML IJ SOLN: 6.2500 mg | INTRAMUSCULAR | Status: DC | PRN

## 2019-02-11 MED ORDER — LEVOTHYROXINE SODIUM 50 MCG PO TABS
50.0000 ug | ORAL_TABLET | Freq: Every day | ORAL | Status: DC
  Administered 2019-02-12: 50 ug via ORAL
  Filled 2019-02-11: qty 1

## 2019-02-11 MED ORDER — PROPOFOL 10 MG/ML IV BOLUS
INTRAVENOUS | Status: DC | PRN
  Administered 2019-02-11: 200 mg via INTRAVENOUS

## 2019-02-11 MED ORDER — PHENYLEPHRINE HCL (PRESSORS) 10 MG/ML IV SOLN
INTRAVENOUS | Status: AC
  Filled 2019-02-11: qty 1

## 2019-02-11 MED ORDER — PROPOFOL 10 MG/ML IV BOLUS
INTRAVENOUS | Status: AC
  Filled 2019-02-11: qty 20

## 2019-02-11 MED ORDER — OXYCODONE HCL 5 MG PO TABS: 5.0000 mg | ORAL_TABLET | ORAL | Status: DC | PRN

## 2019-02-11 MED ORDER — HYDROCHLOROTHIAZIDE 25 MG PO TABS
25.0000 mg | ORAL_TABLET | Freq: Every day | ORAL | Status: DC
  Administered 2019-02-12: 25 mg via ORAL
  Filled 2019-02-11: qty 1

## 2019-02-11 MED ORDER — COLCHICINE 0.6 MG PO TABS
0.6000 mg | ORAL_TABLET | Freq: Two times a day (BID) | ORAL | Status: DC
  Administered 2019-02-11 – 2019-02-12 (×2): 0.6 mg via ORAL
  Filled 2019-02-11 (×2): qty 1

## 2019-02-11 MED ORDER — CEFAZOLIN SODIUM-DEXTROSE 1-4 GM/50ML-% IV SOLN
1.0000 g | Freq: Four times a day (QID) | INTRAVENOUS | Status: AC
  Administered 2019-02-11 – 2019-02-12 (×3): 1 g via INTRAVENOUS
  Filled 2019-02-11 (×3): qty 50

## 2019-02-11 MED ORDER — DIPHENHYDRAMINE HCL 12.5 MG/5ML PO ELIX: 12.5000 mg | ORAL_SOLUTION | ORAL | Status: DC | PRN

## 2019-02-11 MED ORDER — SUCCINYLCHOLINE CHLORIDE 200 MG/10ML IV SOSY
PREFILLED_SYRINGE | INTRAVENOUS | Status: AC
  Filled 2019-02-11: qty 10

## 2019-02-11 MED ORDER — CHLORHEXIDINE GLUCONATE 4 % EX LIQD: 60.0000 mL | Freq: Once | CUTANEOUS | Status: DC

## 2019-02-11 MED ORDER — DEXAMETHASONE SODIUM PHOSPHATE 10 MG/ML IJ SOLN
INTRAMUSCULAR | Status: DC | PRN
  Administered 2019-02-11: 10 mg via INTRAVENOUS

## 2019-02-11 MED ORDER — PROPRANOLOL HCL ER 60 MG PO CP24
60.0000 mg | ORAL_CAPSULE | Freq: Every day | ORAL | Status: DC
  Administered 2019-02-11: 60 mg via ORAL
  Filled 2019-02-11: qty 1

## 2019-02-11 MED ORDER — VANCOMYCIN HCL 1000 MG IV SOLR
INTRAVENOUS | Status: AC
  Filled 2019-02-11: qty 1000

## 2019-02-11 MED ORDER — RISAQUAD PO CAPS
1.0000 | ORAL_CAPSULE | Freq: Every day | ORAL | Status: DC
  Administered 2019-02-12: 11:00:00 1 via ORAL
  Filled 2019-02-11: qty 1

## 2019-02-11 MED ORDER — FENTANYL CITRATE (PF) 100 MCG/2ML IJ SOLN
INTRAMUSCULAR | Status: DC | PRN
  Administered 2019-02-11 (×2): 50 ug via INTRAVENOUS

## 2019-02-11 MED ORDER — MIDAZOLAM HCL 2 MG/2ML IJ SOLN: 0.5000 mg | Freq: Once | INTRAMUSCULAR | Status: DC | PRN

## 2019-02-11 MED ORDER — ASPIRIN 81 MG PO CHEW
81.0000 mg | CHEWABLE_TABLET | Freq: Every day | ORAL | Status: DC
  Administered 2019-02-12: 81 mg via ORAL
  Filled 2019-02-11: qty 1

## 2019-02-11 MED ORDER — METOCLOPRAMIDE HCL 5 MG PO TABS: 5.0000 mg | ORAL_TABLET | Freq: Three times a day (TID) | ORAL | Status: DC | PRN

## 2019-02-11 MED ORDER — ZOLPIDEM TARTRATE 5 MG PO TABS: 5.0000 mg | ORAL_TABLET | Freq: Every evening | ORAL | Status: DC | PRN

## 2019-02-11 MED ORDER — ROCURONIUM BROMIDE 10 MG/ML (PF) SYRINGE
PREFILLED_SYRINGE | INTRAVENOUS | Status: AC
  Filled 2019-02-11: qty 10

## 2019-02-11 MED ORDER — CALCIUM CARBONATE-VITAMIN D 500-200 MG-UNIT PO TABS
1.0000 | ORAL_TABLET | Freq: Two times a day (BID) | ORAL | Status: DC
  Administered 2019-02-11 – 2019-02-12 (×2): 1 via ORAL
  Filled 2019-02-11 (×2): qty 1

## 2019-02-11 MED ORDER — FENTANYL CITRATE (PF) 100 MCG/2ML IJ SOLN: 25.0000 ug | INTRAMUSCULAR | Status: DC | PRN

## 2019-02-11 MED ORDER — MORPHINE SULFATE (PF) 2 MG/ML IV SOLN: 2.0000 mg | INTRAVENOUS | Status: DC | PRN

## 2019-02-11 SURGICAL SUPPLY — 67 items
BASEPLATE GLENOSPHERE 25 STD (Miscellaneous) ×2 IMPLANT
BENZOIN TINCTURE PRP APPL 2/3 (GAUZE/BANDAGES/DRESSINGS) ×2 IMPLANT
BIT DRILL 3.2 PERIPHERAL SCREW (BIT) ×2 IMPLANT
BLADE EXTENDED COATED 6.5IN (ELECTRODE) IMPLANT
BLADE SAW SAG 73X25 THK (BLADE) ×1
BLADE SAW SGTL 73X25 THK (BLADE) ×1 IMPLANT
BODY PROXIMAL PTC 13X132.5 (Joint) ×1 IMPLANT
CAP LOCKING COCR (Cap) ×2 IMPLANT
CHLORAPREP W/TINT 26 (MISCELLANEOUS) ×4 IMPLANT
CLSR STERI-STRIP ANTIMIC 1/2X4 (GAUZE/BANDAGES/DRESSINGS) ×2 IMPLANT
COOLER ICEMAN CLASSIC (MISCELLANEOUS) ×2 IMPLANT
COVER SURGICAL LIGHT HANDLE (MISCELLANEOUS) ×2 IMPLANT
COVER WAND RF STERILE (DRAPES) ×2 IMPLANT
DRAPE INCISE IOBAN 66X45 STRL (DRAPES) ×2 IMPLANT
DRAPE ORTHO SPLIT 77X108 STRL (DRAPES) ×2
DRAPE SHEET LG 3/4 BI-LAMINATE (DRAPES) ×2 IMPLANT
DRAPE SURG ORHT 6 SPLT 77X108 (DRAPES) ×2 IMPLANT
DRSG AQUACEL AG ADV 3.5X 6 (GAUZE/BANDAGES/DRESSINGS) ×2 IMPLANT
ELECT BLADE TIP CTD 4 INCH (ELECTRODE) ×2 IMPLANT
ELECT REM PT RETURN 15FT ADLT (MISCELLANEOUS) ×2 IMPLANT
GLENOSPHERE REV SHOULDER 36 (Joint) ×2 IMPLANT
GLOVE BIO SURGEON STRL SZ8 (GLOVE) ×2 IMPLANT
GLOVE BIOGEL PI IND STRL 8 (GLOVE) ×2 IMPLANT
GLOVE BIOGEL PI INDICATOR 8 (GLOVE) ×2
GLOVE ECLIPSE 8.0 STRL XLNG CF (GLOVE) ×4 IMPLANT
GOWN SPEC L3 XXLG W/TWL (GOWN DISPOSABLE) ×2 IMPLANT
GOWN STRL REUS W/ TWL XL LVL3 (GOWN DISPOSABLE) ×1 IMPLANT
GOWN STRL REUS W/TWL XL LVL3 (GOWN DISPOSABLE) ×1
GUIDEWIRE GLENOID 2.5X220 (WIRE) ×2 IMPLANT
HANDPIECE INTERPULSE COAX TIP (DISPOSABLE) ×1
HEMOSTAT SURGICEL 2X14 (HEMOSTASIS) IMPLANT
IMPL REVERSE SHOULDER 0X3.5 (Shoulder) ×1 IMPLANT
IMPLANT REVERSE SHOULDER 0X3.5 (Shoulder) ×2 IMPLANT
INSERT REV KIT SHOULDER 9X36 (Joint) ×2 IMPLANT
KIT BASIN OR (CUSTOM PROCEDURE TRAY) ×2 IMPLANT
KIT STABILIZATION SHOULDER (MISCELLANEOUS) ×2 IMPLANT
KIT TURNOVER KIT A (KITS) IMPLANT
MANIFOLD NEPTUNE II (INSTRUMENTS) ×2 IMPLANT
NEEDLE HYPO 25X1 1.5 SAFETY (NEEDLE) IMPLANT
NEEDLE MAYO CATGUT SZ4 (NEEDLE) IMPLANT
NS IRRIG 1000ML POUR BTL (IV SOLUTION) ×2 IMPLANT
PACK SHOULDER (CUSTOM PROCEDURE TRAY) ×2 IMPLANT
PAD COLD SHLDR WRAP-ON (PAD) ×2 IMPLANT
PROXIMAL BODY PTC 13X132.5 (Joint) ×2 IMPLANT
PRT COAT DISTL STEM 13 SHOUL (Miscellaneous) ×2 IMPLANT
RESTRAINT HEAD UNIVERSAL NS (MISCELLANEOUS) ×2 IMPLANT
SCREW 5.0X38 SMALL F/PERFORM (Screw) ×2 IMPLANT
SCREW ASSEMBLY COCR TYPE 0 (Screw) ×2 IMPLANT
SCREW BONE 6.5X40 SM (Screw) ×2 IMPLANT
SCREW PERIPHERAL 5.0X34 (Screw) ×2 IMPLANT
SET HNDPC FAN SPRY TIP SCT (DISPOSABLE) ×1 IMPLANT
SLING ULTRA II L (ORTHOPEDIC SUPPLIES) ×2 IMPLANT
SLING ULTRA III MED (ORTHOPEDIC SUPPLIES) ×2 IMPLANT
SPONGE LAP 18X18 X RAY DECT (DISPOSABLE) IMPLANT
STEM SHLDR DIST PRTLY COATD 13 (Miscellaneous) ×1 IMPLANT
STRIP CLOSURE SKIN 1/2X4 (GAUZE/BANDAGES/DRESSINGS) ×2 IMPLANT
SUCTION FRAZIER HANDLE 12FR (TUBING) ×1
SUCTION TUBE FRAZIER 12FR DISP (TUBING) ×1 IMPLANT
SUT ETHIBOND 2 V 37 (SUTURE) ×2 IMPLANT
SUT ETHIBOND NAB CT1 #1 30IN (SUTURE) ×2 IMPLANT
SUT FIBERWIRE #5 38 CONV NDL (SUTURE) ×8
SUT MON AB 3-0 SH 27 (SUTURE) ×1
SUT MON AB 3-0 SH27 (SUTURE) ×1 IMPLANT
SUT VIC AB 0 CT1 36 (SUTURE) ×2 IMPLANT
SUTURE FIBERWR #5 38 CONV NDL (SUTURE) ×4 IMPLANT
TOWEL OR 17X26 10 PK STRL BLUE (TOWEL DISPOSABLE) ×2 IMPLANT
WATER STERILE IRR 1000ML POUR (IV SOLUTION) ×4 IMPLANT

## 2019-02-11 NOTE — Anesthesia Postprocedure Evaluation (Signed)
Anesthesia Post Note  Patient: Insurance claims handler  Procedure(s) Performed: REVERSE SHOULDER ARTHROPLASTY (Left Shoulder)     Patient location during evaluation: PACU Anesthesia Type: General Level of consciousness: awake and alert Pain management: pain level controlled Vital Signs Assessment: post-procedure vital signs reviewed and stable Respiratory status: spontaneous breathing, nonlabored ventilation, respiratory function stable and patient connected to nasal cannula oxygen Cardiovascular status: blood pressure returned to baseline and stable Postop Assessment: no apparent nausea or vomiting Anesthetic complications: no    Last Vitals:  Vitals:   02/11/19 1650 02/11/19 1700  BP: 130/69 105/76  Pulse:  78  Resp:  19  Temp:    SpO2:  100%    Last Pain:  Vitals:   02/11/19 1649  TempSrc:   PainSc: 0-No pain                 , DAVID

## 2019-02-11 NOTE — Op Note (Addendum)
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  Patient was about 5 weeks out from injury and had a semi-healed fracture though she had a clear intra-articular step-off and her tuberosities were only partially healed.  The lesser seemed healed enough and we were able to do a peel type management option for the subscap.  The greater was only partially healed but we were able to reinforce this with sutures and cerclage is in place.  We had to use a long diaphyseal fit stem secondary to her calcar being very weak and we were not able to get appropriate purchase with a short stem.  We also were worried about fixation as the patient would likely use the arm sooner than most secondary to her above-knee amputation.  Is at a high risk for infection as she had a history of infection of the lower extremity but nothing in the left shoulder.  No clinical sign of infection at the time of surgery.  Implants: Tornier 13 mm revive partially coated with 13 mm proximal body.  0 high, +9 poly-, 25 baseplate with standard 36 glenosphere  Post-operative plan: The patient will be NWB in sling.  The patient will be admitted overnight.  DVT prophylaxis not indicated in isolated upper extremity surgery patient with no specific risks factors.  Pain control with PRN pain medication preferring oral medicines.  Follow up plan will be scheduled in approximately 7 days for incision check and XR.  No PT for arm but okay to transfer transfers.  Post-Op Diagnosis: Same Surgeons:Primary: Hiram Gash, MD Assistants:Brandon Lynnell Jude Location: [facility] ROOM 06 Anesthesia: General Antibiotics: Ancef 2g preop, Vancomycin 1035m locally, tobramycin 1.2 g Tourniquet time:  None Estimated Blood Loss: 1270Complications: None Specimens: None Implants: Implant Name Type Inv. Item Serial No. Manufacturer Lot No. LRB No. Used Action  BASEPLATE GLENOSPHERE 262BJSTD - [id number] BASEPLATE GLENOSPHERE 260VPSTD [id number]  Left 1 Implanted  SCREW BONE 6.5X40 SM - [id number] SCREW BONE 6.5X40 SM  [employer]  Left 1 Implanted  SCREW 5.0X38 SMALL F/PERFORM - [id number] SCREW 5.0X38 SMALL F/PERFORM  [employer]  Left 1 Implanted  SCREW PERIPHERAL 5.0X34 - [id number] SCREW PERIPHERAL 5.0X34  [employer]  Left 1 Implanted  GLENOSPHERE REV SHOULDER 36 - [id number] GLENOSPHERE REV SHOULDER 36  [employer]  Left 1 Implanted  IMPLANT REVERSE SHOULDER 0X3.5 - [id number] IMPLANT REVERSE SHOULDER 0X3.5 [id number]  Left 1 Implanted  INSERT REV KIT SHOULDER 9X36 - [id number] INSERT REV KIT SHOULDER 9X36 [id number]  Left 1 Implanted  CAP LOCKING COCR - [id number] CAP LOCKING COCR [id number] [employer]  Left 1 Implanted  SCREW ASSEMBLY COCR TYPE 0 - [id number] SCREW ASSEMBLY COCR TYPE 0 [id number] [employer]  Left 1 Implanted  AWQUALIS FLEX REVIVE PTC PROXIMAL BODY   [id number]  Left 1 Implanted  AEQUALIS FLEX REVIVE PARTIALLY COATED DISTAL STEM   [id number] [employer]  Left 1 Implanted    Indications for Surgery:   BLekia Mcdonald a 68y.o. 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  Unfortunately the patient had an  intra-articular fracture and relies on her left upper extremity for ambulation purposes and transfers as she is an amputee.  She requires good function of her left shoulder we felt that going for reverse shoulder arthroplasty even in the setting of previous infection in her lower extremity was appropriate due to her inability to mobilize  without this.  Benefits and risks of operative and nonoperative management were discussed prior to surgery with patient/guardian(s) and informed consent form was completed.  Infection and need for further surgery were discussed as was prosthetic stability and cuff issues.  We additionally specifically discussed risks of axillary nerve injury, infection, periprosthetic fracture, continued pain and longevity of implants prior to beginning procedure.      Procedure:   The patient was identified in the preoperative holding area where the surgical site was marked. Block placed by anesthesia with exparel.  The patient was taken to the OR where a procedural timeout was called and the above noted anesthesia was induced.  The patient was positioned beachchair on allen table with spider arm positioner.  Preoperative antibiotics were dosed.  The patient's left shoulder was prepped and draped in the usual sterile fashion.  A second preoperative timeout was called.       Standard deltopectoral approach was performed with a #10 blade. We dissected down to the subcutaneous tissues and the cephalic vein was taken laterally with the deltoid. Clavipectoral fascia was incised in line with the incision. Deep retractors were placed. The long of the biceps tendon was identified and there was significant tenosynovitis present.  Tenodesis was performed to the pectoralis tendon with #2 Ethibond. The remaining biceps was followed up into the rotator interval where it was released.    We able to perform a subscap peel as the lesser was relatively healed.  The greater was not particularly healed and there was clear intra-articular step-off of the head.  We performed a head cut as is typical and used a rondure to debride back callus around the greater but we were able to reinforce the partially healing posterior cuff to the shaft with FiberWire sutures that we then would eventually pass around the stem and a cerclage fashion to the  subscapularis.  We were happy with our skeletonization of the proximal humerus.    We then released the SGHL with bovie cautery prior to placing a curved mayo at the junction of the anterior glenoid well above the axillary nerve and bluntly dissecting the subscapularis from the capsule.  We then carefully protected the axillary nerve as we gently released the inferior capsule to fully mobilize the subscapularis.  An anterior deltoid retractor was then placed as well as a small Hohmann retractor superiorly.   The glenoid was significantly damaged as the patient had a baseline history of arthritis.  The remaining labrum was removed circumferentially taking great care not to disrupt the posterior capsule.    The glenoid drill guide was placed and used to drill a guide pin in the center, inferior position. The glenoid face was then reamed concentrically over the guide wire. The center hole was drilled over the guidepin in a near anatomic angle of version. Next the glenoid vault was drilled back to a depth of  40 mm.  We tapped and then placed a 25 mm size baseplate with 0 lateralization was selected with a 40 mm x 6.5 mm length central screw.  The base plate was screwed into the glenoid vault obtaining secure fixation. We next placed superior and inferior  locking screws for additional fixation.  Next a 36 mm glenosphere was selected and impacted onto the baseplate. The center screw was tightened.   We then repositioned the arm to give access to the humeral shaft fragment.    We attempted to use the longstem flex however the calcar was damaged and this not provide rotational stability. We broached with the Revive stem implants  starting with a size 9 reamer and reaming up to 13 which obtained an appropriate fit.  The proximal body was sized separately and attached to trial and achieve a stable articulation.    We trialed with multiple size tray and polyethylene options and selected a 0 high which provided good  stability and range of motion without excess soft tissue tension. The offset was dialed in to match the normal anatomy. The shoulder was trialed.  There was good ROM in all planes and the shoulder was stable with no inferior translation.   We then mobilized her tuberosities again and placed the anterior deep limbs of the 4 #5 fiber wires around the stem.  1 of these was tied down fixing the greater tuberosity in place after bone graft harvest from the humeral head component was placed underneath.  A +0 high offset tray was selected and impacted onto the stem.   A 36+9 polyethylene liner was impacted onto the stem.  The joint was reduced and thoroughly irrigated with pulsatile lavage. The remaining sutures were then placed through the subscapularis and the bone tendon junction and the tuberosities were reduced after bone graft placed beneath as autograft at the subscap.  We horizontally secured the tuberosities before placing vertical fixation with the suture that was placed into the shaft.  Tuberosities moved as a unit were happy with her overall reduction.  This was checked on fluoroscopy confirming our position.  We irrigated copiously at this point.  Hemostasis was obtained. The deltopectoral interval was reapproximated with #1 Ethibond. The subcutaneous tissues were closed with 3-0 Vicryl and the skin was closed with running monocryl.     The wounds were cleaned and dried and an Aquacel dressing was placed. The drapes taken down. The arm was placed into sling with abduction pillow. Patient was awakened, extubated, and transferred to the recovery room in stable condition. There were no intraoperative complications. The sponge, needle, and attention counts were correct at the end of the case.   Joya Gaskins, OPA-C, present and scrubbed throughout the case, critical for completion in a timely fashion, and for retraction, instrumentation, closure.

## 2019-02-11 NOTE — Progress Notes (Signed)
Daughter called into PACU for update, informed surgery had not begun as of now, gave pre-op number for family to get update.

## 2019-02-11 NOTE — Progress Notes (Signed)
Assisted Dr. Carswell Jackson with left, ultrasound guided, interscalene  block. Side rails up, monitors on throughout procedure. See vital signs in flow sheet. Tolerated Procedure well. °

## 2019-02-11 NOTE — H&P (Signed)
PREOPERATIVE H&P  Chief Complaint: left proximal humerus fracture  HPI: Shirley Mcdonald is a 68 y.o. female who presents for preoperative history and physical with a diagnosis of left proximal humerus fracture. Symptoms are rated as moderate to severe, and have been worsening.  This is significantly impairing activities of daily living.  Please see my clinic note for full details on this patient's care.  She has elected for surgical management.   Past Medical History:  Diagnosis Date  . Alpha (0) thalassemia (HCC)    Minor  . Arthritis   . Complication of anesthesia    Combative  . Depression   . Diabetes mellitus without complication ([place])   . Headache   . Hypertension   . Hypothyroidism   . PONV (postoperative nausea and vomiting) 1979  . Sleep apnea    Past Surgical History:  Procedure Laterality Date  . APPENDECTOMY    . ARTHROSCOPY KNEE W/ DRILLING    . CESAREAN SECTION     x2  . CHOLECYSTECTOMY    . EXPLORATORY LAPAROTOMY  1985  . HERNIA REPAIR     x3  . LEG AMPUTATION     AKA  . TONSILLECTOMY     Social History   Socioeconomic History  . Marital status: Unknown    Spouse name: Not on file  . Number of children: Not on file  . Years of education: Not on file  . Highest education level: Not on file  Occupational History  . Not on file  Social Needs  . Financial resource strain: Not on file  . Food insecurity    Worry: Not on file    Inability: Not on file  . Transportation needs    Medical: Not on file    Non-medical: Not on file  Tobacco Use  . Smoking status: Former Smoker    Quit date: 2015    Years since quitting: 5.4  . Smokeless tobacco: Never Used  . Tobacco comment: 2015  Substance and Sexual Activity  . Alcohol use: Never    Frequency: Never  . Drug use: Never  . Sexual activity: Not on file  Lifestyle  . Physical activity    Days per week: Not on file    Minutes per session: Not on file  . Stress: Not on file  Relationships  . Social  Herbalist on phone: Not on file    Gets together: Not on file    Attends religious service: Not on file    Active member of club or organization: Not on file    Attends meetings of clubs or organizations: Not on file    Relationship status: Not on file  Other Topics Concern  . Not on file  Social History Narrative  . Not on file   History reviewed. No pertinent family history. Allergies  Allergen Reactions  . Hydromorphone Hcl Rash  . Oxycontin [Oxycodone Hcl] Itching  . Tape     Blister   . Amlodipine Besy-Benazepril Hcl Cough  . Saccharin Other (See Comments)   Prior to Admission medications   Medication Sig Start Date End Date Taking? Authorizing Provider  Aspirin Buf,CaCarb-MgCarb-MgO, 81 MG TABS Take 81 mg by mouth daily.   Yes [provider]  atorvastatin (LIPITOR) 20 MG tablet Take 20 mg by mouth every evening. 06/24/16  Yes [provider]  buPROPion (WELLBUTRIN XL) 300 MG 24 hr tablet Take 300 mg by mouth daily. 08/17/16  Yes [provider]  calcium-vitamin  D (OSCAL WITH D) 500-200 MG-UNIT tablet Take 1 tablet by mouth 2 (two) times a day.   Yes [provider]  Cholecalciferol (VITAMIN D3) 125 MCG (5000 UT) TABS Take 5,000 Units by mouth every evening.   Yes [provider]  Coenzyme Q10 (CO Q-10 PO) Take 1 capsule by mouth daily.   Yes [provider]  colchicine 0.6 MG tablet Take 0.6 mg by mouth 2 (two) times a day. 06/25/16  Yes [provider]  cyclobenzaprine (FLEXERIL) 5 MG tablet Take 5 mg by mouth 2 (two) times a day.   Yes [provider]  fluticasone (FLONASE) 50 MCG/ACT nasal spray Place 2 sprays into both nostrils at bedtime.   Yes [provider]  ibuprofen (ADVIL) 200 MG tablet Take 800 mg by mouth every 6 (six) hours as needed for moderate pain.   Yes [provider]  Insulin Detemir (LEVEMIR FLEXTOUCH) 100 UNIT/ML Pen Inject 5 Units into the skin 2 (two)  times a day. 07/02/17  Yes [provider]  insulin lispro (HUMALOG KWIKPEN) 100 UNIT/ML KwikPen Inject 1-10 Units into the skin 4 (four) times daily. 08/15/16  Yes [provider]  Boris LownKrill Oil 500 MG CAPS Take 500 mg by mouth daily.   Yes [provider]  lamoTRIgine (LAMICTAL) 150 MG tablet Take 150 mg by mouth at bedtime.   Yes [provider]  levothyroxine (SYNTHROID) 50 MCG tablet Take 50 mcg by mouth daily before breakfast.   Yes [provider]  Lidocaine 4 % PTCH Place 1 patch onto the skin daily as needed for pain. 01/03/19  Yes [provider]  losartan-hydrochlorothiazide (HYZAAR) 100-25 MG tablet Take 1 tablet by mouth daily. 07/23/17  Yes [provider]  metFORMIN (GLUCOPHAGE-XR) 500 MG 24 hr tablet Take 500 mg by mouth 2 (two) times daily with a meal. 07/25/16  Yes [provider]  Multiple Vitamin (MULTIVITAMIN WITH MINERALS) TABS tablet Take 1 tablet by mouth daily.   Yes [provider]  Omeprazole 20 MG TBEC Take 20 mg by mouth at bedtime. 08/17/16  Yes [provider]  ondansetron (ZOFRAN-ODT) 4 MG disintegrating tablet Take 4 mg by mouth every 8 (eight) hours as needed for nausea/vomiting.   Yes [provider]  pregabalin (LYRICA) 150 MG capsule Take 150 mg by mouth 2 (two) times a day. 09/04/17  Yes [provider]  propranolol ER (INDERAL LA) 60 MG 24 hr capsule Take 60 mg by mouth at bedtime.   Yes [provider]  traZODone (DESYREL) 50 MG tablet Take 50 mg by mouth at bedtime. 08/15/16  Yes [provider]  venlafaxine XR (EFFEXOR-XR) 150 MG 24 hr capsule Take 300 mg by mouth daily with breakfast.   Yes [provider]  albuterol (VENTOLIN HFA) 108 (90 Base) MCG/ACT inhaler Inhale 2 puffs into the lungs every 6 (six) hours as needed for shortness of breath. 08/15/16   [provider]  lactobacillus acidophilus (BACID) TABS tablet Take 1  tablet by mouth daily.    [provider]     Positive ROS: All other systems have been reviewed and were otherwise negative with the exception of those mentioned in the HPI and as above.  Physical Exam: General: Alert, no acute distress Cardiovascular: No pedal edema Respiratory: No cyanosis, no use of accessory musculature GI: No organomegaly, abdomen is soft and non-tender Skin: No lesions in the area of chief complaint Neurologic: Sensation intact distally Psychiatric: Patient is competent  for consent with normal mood and affect Lymphatic: No axillary or cervical lymphadenopathy  MUSCULOSKELETAL: L shoulder pain w ROM, skin intact,   Assessment: left proximal humerus fracture  Plan: Plan for Procedure(s): REVERSE SHOULDER ARTHROPLASTY  The risks benefits and alternatives were discussed with the patient including but not limited to the risks of nonoperative treatment, versus surgical intervention including infection, bleeding, nerve injury,  blood clots, cardiopulmonary complications, morbidity, mortality, among others, and they were willing to proceed.   Bjorn Pippinax T , MD  02/11/2019 1:38 PM

## 2019-02-11 NOTE — Anesthesia Preprocedure Evaluation (Addendum)
Anesthesia Evaluation  Patient identified by MRN, date of birth, ID band Patient awake    Reviewed: Allergy & Precautions, NPO status , Patient's Chart, lab work & pertinent test results, reviewed documented beta blocker date and time   History of Anesthesia Complications (+) PONV  Airway Mallampati: II  TM Distance: >3 FB Neck ROM: Full    Dental  (+) Edentulous Upper, Poor Dentition, Missing, Dental Advisory Given   Pulmonary COPD,  COPD inhaler, former smoker,    breath sounds clear to auscultation       Cardiovascular hypertension, Pt. on medications and Pt. on home beta blockers (-) angina Rhythm:Regular Rate:Normal     Neuro/Psych  Headaches, Anxiety Depression    GI/Hepatic Neg liver ROS, GERD  Medicated and Controlled,  Endo/Other  diabetes (glu 175), Insulin DependentHypothyroidism Morbid obesity  Renal/GU negative Renal ROS     Musculoskeletal  (+) Arthritis ,   Abdominal (+) + obese,   Peds  Hematology Alpha thal minor   Anesthesia Other Findings   Reproductive/Obstetrics                            Anesthesia Physical Anesthesia Plan  ASA: III  Anesthesia Plan: General   Post-op Pain Management: GA combined w/ Regional for post-op pain   Induction: Intravenous  PONV Risk Score and Plan: 3 and Ondansetron, Dexamethasone and Scopolamine patch - Pre-op  Airway Management Planned: Oral ETT  Additional Equipment:   Intra-op Plan:   Post-operative Plan: Extubation in OR  Informed Consent: I have reviewed the patients History and Physical, chart, labs and discussed the procedure including the risks, benefits and alternatives for the proposed anesthesia with the patient or authorized representative who has indicated his/her understanding and acceptance.     Dental advisory given  Plan Discussed with: CRNA and Surgeon  Anesthesia Plan Comments: (Plan routine monitors,  GETA with interscalene block for post op analgesia)       Anesthesia Quick Evaluation

## 2019-02-11 NOTE — Transfer of Care (Signed)
Immediate Anesthesia Transfer of Care Note  Patient: Shirley Mcdonald  Procedure(s) Performed: REVERSE SHOULDER ARTHROPLASTY (Left Shoulder)  Patient Location: PACU  Anesthesia Type:GA combined with regional for post-op pain  Level of Consciousness: drowsy and patient cooperative  Airway & Oxygen Therapy: Patient Spontanous Breathing and Patient connected to face mask oxygen  Post-op Assessment: Report given to RN and Post -op Vital signs reviewed and stable  Post vital signs: Reviewed and stable  Last Vitals:  Vitals Value Taken Time  BP 130/69 02/11/19 1649  Temp    Pulse 80 02/11/19 1652  Resp 18 02/11/19 1652  SpO2 100 % 02/11/19 1652  Vitals shown include unvalidated device data.  Last Pain:  Vitals:   02/11/19 1210  TempSrc:   PainSc: Asleep         Complications: No apparent anesthesia complications

## 2019-02-11 NOTE — Anesthesia Procedure Notes (Signed)
Anesthesia Regional Block: Interscalene brachial plexus block   Pre-Anesthetic Checklist: ,, timeout performed, Correct Patient, Correct Site, Correct Laterality, Correct Procedure, Correct Position, site marked, Risks and benefits discussed,  Surgical consent,  Pre-op evaluation,  At surgeon's request and post-op pain management  Laterality: Left and Upper  Prep: chloraprep       Needles:  Injection technique: Single-shot  Needle Type: Echogenic Stimulator Needle     Needle Length: 9cm  Needle Gauge: 21     Additional Needles:   Procedures:, nerve stimulator,,, ultrasound used (permanent image in chart),,,,   Nerve Stimulator or Paresthesia:  Response: forearm twitch, 0.44 mA, 0.1 ms,   Additional Responses:   Narrative:  Start time: 02/11/2019 12:01 PM End time: 02/11/2019 12:07 PM Injection made incrementally with aspirations every 5 mL.  Performed by: Personally  Anesthesiologist: Annye Asa, MD  Additional Notes: Pt identified in Holding room.  Monitors applied. Working IV access confirmed. Sterile prep, drape L clavicle and neck.  #21ga ECHOgenic PNS to forearm twitch at 0.55mA threshold with US guidance.  15cc 0.5% Bupivacaine with Exparel injected incrementally after negative test dose.  Patient asymptomatic, VSS, no heme aspirated, tolerated well.  Jenita Seashore, MD

## 2019-02-11 NOTE — Anesthesia Procedure Notes (Signed)
Procedure Name: Intubation Date/Time: 02/11/2019 2:18 PM Performed by: Maxwell Caul, CRNA Pre-anesthesia Checklist: Patient identified, Emergency Drugs available, Suction available and Patient being monitored Patient Re-evaluated:Patient Re-evaluated prior to induction Oxygen Delivery Method: Circle system utilized Preoxygenation: Pre-oxygenation with 100% oxygen Induction Type: IV induction Laryngoscope Size: Mac and 3 Grade View: Grade I Tube type: Oral Tube size: 7.0 mm Number of attempts: 1 Airway Equipment and Method: Stylet Placement Confirmation: ETT inserted through vocal cords under direct vision,  positive ETCO2 and breath sounds checked- equal and bilateral Secured at: 21 cm Tube secured with: Tape Dental Injury: Teeth and Oropharynx as per pre-operative assessment

## 2019-02-12 ENCOUNTER — Encounter (HOSPITAL_COMMUNITY): Payer: Self-pay | Admitting: Emergency Medicine

## 2019-02-12 ENCOUNTER — Emergency Department (HOSPITAL_COMMUNITY)
Admission: EM | Admit: 2019-02-12 | Discharge: 2019-02-13 | Disposition: A | Discharge status: Home / Self Care | Payer: Medicare Other | Attending: Emergency Medicine | Admitting: Emergency Medicine

## 2019-02-12 ENCOUNTER — Emergency Department (HOSPITAL_COMMUNITY): Payer: Medicare Other

## 2019-02-12 DIAGNOSIS — Z89611 Acquired absence of right leg above knee: Secondary | ICD-10-CM | POA: Insufficient documentation

## 2019-02-12 DIAGNOSIS — Z7982 Long term (current) use of aspirin: Secondary | ICD-10-CM | POA: Insufficient documentation

## 2019-02-12 DIAGNOSIS — Z794 Long term (current) use of insulin: Secondary | ICD-10-CM | POA: Insufficient documentation

## 2019-02-12 DIAGNOSIS — Z87891 Personal history of nicotine dependence: Secondary | ICD-10-CM | POA: Insufficient documentation

## 2019-02-12 DIAGNOSIS — Z79899 Other long term (current) drug therapy: Secondary | ICD-10-CM | POA: Insufficient documentation

## 2019-02-12 DIAGNOSIS — E11649 Type 2 diabetes mellitus with hypoglycemia without coma: Secondary | ICD-10-CM | POA: Insufficient documentation

## 2019-02-12 DIAGNOSIS — E162 Hypoglycemia, unspecified: Secondary | ICD-10-CM

## 2019-02-12 LAB — GLUCOSE, CAPILLARY
Glucose-Capillary: 137 mg/dL — ABNORMAL HIGH (ref 70–99)
Glucose-Capillary: 152 mg/dL — ABNORMAL HIGH (ref 70–99)
Glucose-Capillary: 237 mg/dL — ABNORMAL HIGH (ref 70–99)

## 2019-02-12 LAB — CBG MONITORING, ED: Glucose-Capillary: 234 mg/dL — ABNORMAL HIGH (ref 70–99)

## 2019-02-12 MED ORDER — ONDANSETRON 4 MG PO TBDP: 4.0000 mg | ORAL_TABLET | Freq: Three times a day (TID) | ORAL | Status: DC | PRN

## 2019-02-12 MED ORDER — ONDANSETRON HCL 4 MG PO TABS: 4.0000 mg | ORAL_TABLET | Freq: Three times a day (TID) | ORAL | 1 refills | Status: AC | PRN

## 2019-02-12 MED ORDER — ACETAMINOPHEN 500 MG PO TABS: 1000.0000 mg | ORAL_TABLET | Freq: Three times a day (TID) | ORAL | 0 refills | Status: AC

## 2019-02-12 MED ORDER — METFORMIN HCL 500 MG PO TABS
500.0000 mg | ORAL_TABLET | Freq: Two times a day (BID) | ORAL | Status: DC
  Administered 2019-02-12 – 2019-02-13 (×2): 500 mg via ORAL
  Filled 2019-02-12 (×2): qty 1

## 2019-02-12 MED ORDER — CELECOXIB 100 MG PO CAPS: 100.0000 mg | ORAL_CAPSULE | Freq: Two times a day (BID) | ORAL | 2 refills | Status: DC

## 2019-02-12 MED ORDER — OXYCODONE HCL 5 MG PO TABS
5.0000 mg | ORAL_TABLET | Freq: Four times a day (QID) | ORAL | Status: DC | PRN
  Administered 2019-02-12 – 2019-02-13 (×2): 5 mg via ORAL
  Filled 2019-02-12 (×2): qty 1

## 2019-02-12 MED ORDER — INSULIN DETEMIR 100 UNIT/ML ~~LOC~~ SOLN
5.0000 [IU] | Freq: Two times a day (BID) | SUBCUTANEOUS | Status: DC
  Administered 2019-02-12: 5 [IU] via SUBCUTANEOUS
  Filled 2019-02-12 (×2): qty 0.05

## 2019-02-12 MED ORDER — OXYCODONE HCL 5 MG PO TABS: ORAL_TABLET | ORAL | 0 refills | Status: AC

## 2019-02-12 MED ORDER — IBUPROFEN 400 MG PO TABS: 800.0000 mg | ORAL_TABLET | Freq: Four times a day (QID) | ORAL | Status: DC | PRN

## 2019-02-12 MED FILL — Vancomycin HCl For IV Soln 1 GM (Base Equivalent): INTRAVENOUS | Qty: 1000 | Status: AC

## 2019-02-12 NOTE — ED Notes (Signed)
Purewick placed on pt. 

## 2019-02-12 NOTE — Progress Notes (Signed)
Occupational Therapy Treatment Patient Details Name: Shirley Mcdonald MRN: [id number] DOB: [birth year] Today's Date: 02/12/2019    History of present illness Left proximal humerus fracture in setting of rotator cuff arthropathy and right lower extremity AKA   OT comments  Spoke with MD regarding Dc plan.  Pt much improved 2nd OT session with squat pivot transfer rather than stand transfers.  Pt assures OT she has A at home and DME.  Recommend pt have A with ADL activity and S at home.  Pt states daughter can A her as well as hiring personal care A  Follow Up Recommendations  Home health OT;Supervision/Assistance - 24 hour    Equipment Recommendations  None recommended by OT    Recommendations for Other Services      Precautions / Restrictions Precautions Precautions: Shoulder Shoulder Interventions: Shoulder sling/immobilizer;Shoulder abduction pillow Required Braces or Orthoses: Sling Restrictions Weight Bearing Restrictions: Yes LUE Weight Bearing: Non weight bearing       Mobility Bed Mobility Overal bed mobility: Needs Assistance Bed Mobility: Supine to Sit;Sit to Supine     Supine to sit: Min assist;HOB elevated Sit to supine: Min assist   General bed mobility comments: pt hooks foot onto WC inorder to have momentum to sit up.  Transfers Overall transfer level: Needs assistance Equipment used: 1 person hand held assist Transfers: Sit to/from [employer] Sit to Stand: Mod assist;Min assist   Squat pivot transfers: Min assist;Mod assist     General transfer comment: pt performed squat pivot transfer with OT and this went went much better than stand pivot    Balance Overall balance assessment: Needs assistance Sitting-balance support: Single extremity supported Sitting balance-Leahy Scale: Good     Standing balance support: Single extremity supported Standing balance-Leahy Scale: Poor                             ADL either  performed or assessed with clinical judgement   ADL                   Upper Body Dressing : Moderate assistance;Sitting   Lower Body Dressing: Moderate assistance;Cueing for compensatory techniques;Sitting/lateral leans;Cueing for safety   Toilet Transfer: Minimal assistance;Moderate assistance Toilet Transfer Details (indicate cue type and reason): squat pivot from chair to bed Toileting- Clothing Manipulation and Hygiene: Sitting/lateral lean;Minimal assistance         General ADL Comments: Pt plans to call [facility] at her ILF and request some personal care A per OT suggestions.               Cognition Arousal/Alertness: Awake/alert Behavior During Therapy: WFL for tasks assessed/performed Overall Cognitive Status: Within Functional Limits for tasks assessed                                          Exercises     Shoulder Instructions Shoulder Instructions Donning/doffing shirt without moving shoulder: Minimal assistance;Patient able to independently direct caregiver Method for sponge bathing under operated UE: Minimal assistance;Patient able to independently direct caregiver Donning/doffing sling/immobilizer: Minimal assistance;Patient able to independently direct caregiver Correct positioning of sling/immobilizer: Minimal assistance;Patient able to independently direct caregiver ROM for elbow, wrist and digits of operated UE: Minimal assistance;Patient able to independently direct caregiver Sling wearing schedule (on at all times/off for ADL's): Supervision/safety Proper positioning of operated UE when showering:  Patient able to independently direct caregiver;Minimal assistance Positioning of UE while sleeping: Minimal assistance;Patient able to independently direct caregiver     General Comments      Pertinent Vitals/ Pain       Pain Score: 3  Pain Descriptors / Indicators: Sore Pain Intervention(s): Monitored during session          Frequency  Min 2X/week        Progress Toward Goals  OT Goals(current goals can now be found in the care plan section)  Progress towards OT goals: Progressing toward goals  Acute Rehab OT Goals Patient Stated Goal: be able to get home to her apartment OT Goal Formulation: With patient Time For Goal Achievement: 02/18/19 Potential to Achieve Goals: Good ADL Goals Pt Will Transfer to Toilet: with min assist Pt Will Perform Toileting - Clothing Manipulation and hygiene: with min assist;sitting/lateral leans;sit to/from stand Pt/caregiver will Perform Home Exercise Program: Left upper extremity;With written HEP provided(elbow, wrist and hand only)  Plan Discharge plan needs to be updated       AM-PAC OT "6 Clicks" Daily Activity     Outcome Measure   Help from another person eating meals?: A Little Help from another person taking care of personal grooming?: A Little Help from another person toileting, which includes using toliet, bedpan, or urinal?: A Lot Help from another person bathing (including washing, rinsing, drying)?: A Lot Help from another person to put on and taking off regular upper body clothing?: A Little Help from another person to put on and taking off regular lower body clothing?: A Lot 6 Click Score: 15    End of Session Equipment Utilized During Treatment: Gait belt;Other (comment)(sling)  OT Visit Diagnosis: Unsteadiness on feet (R26.81);Muscle weakness (generalized) (M62.81);Pain Pain - Right/Left: Left Pain - part of body: Shoulder   Activity Tolerance Patient tolerated treatment well   Patient Left in chair;with call bell/phone within reach   Nurse Communication Mobility status;Weight bearing status        Time: 1415-1434 OT Time Calculation (min): 19 min  Charges: OT General Charges $OT Visit: 1 Visit OT Evaluation $OT Eval Moderate Complexity: 1 Mod OT Treatments $Self Care/Home Management : 8-22 mins  Lise AuerLori , OT Acute  Rehabilitation Services [phone]- [phone] Office- [phone]      , Karin GoldenLorraine D 02/12/2019, 4:52 PM

## 2019-02-12 NOTE — ED Notes (Signed)
Patient given PO fluids and food with her metformin.

## 2019-02-12 NOTE — Progress Notes (Signed)
Patient was given discharge instructions with no immediate questions or concerns. The patient will be taken downstairs via wheelchair for discharge. 

## 2019-02-12 NOTE — Evaluation (Signed)
Occupational Therapy Evaluation Patient Details Name: Shirley Mcdonald MRN: [id number] DOB: 06/11/1951 Today's Date: 02/12/2019    History of Present Illness Left proximal humerus fracture in setting of rotator cuff arthropathy and right lower extremity AKA   Clinical Impression   Pt admitted for L shoulder surgery per Dr Griffin Basil. Pt currently with functional limitations due to the deficits listed below (see OT Problem List).  Pt will benefit from skilled OT to increase their safety and independence with ADL and functional mobility for ADL to facilitate discharge to venue listed below.   Pt will need post acute rehab as not being able to use her LUE with AKA is very challenging.  Do not feel pt will be able to perform her ADL activity at mod I level     Follow Up Recommendations  SNF    Equipment Recommendations  None recommended by OT    Recommendations for Other Services       Precautions / Restrictions Precautions Precautions: Shoulder Shoulder Interventions: Shoulder sling/immobilizer;Shoulder abduction pillow Required Braces or Orthoses: Sling Restrictions Weight Bearing Restrictions: Yes LUE Weight Bearing: Non weight bearing      Mobility Bed Mobility Overal bed mobility: Needs Assistance Bed Mobility: Supine to Sit     Supine to sit: Min assist;HOB elevated        Transfers Overall transfer level: Needs assistance Equipment used: 1 person hand held assist Transfers: Sit to/from Stand Sit to Stand: +2 physical assistance;Max assist              Balance Overall balance assessment: Needs assistance Sitting-balance support: Single extremity supported Sitting balance-Leahy Scale: Good     Standing balance support: Single extremity supported Standing balance-Leahy Scale: Poor                             ADL either performed or assessed with clinical judgement   ADL Overall ADL's : Needs assistance/impaired                          Toilet Transfer: Maximal assistance;+2 for safety/equipment;+2 for physical assistance;Cueing for safety;Cueing for sequencing Toilet Transfer Details (indicate cue type and reason): Sit to stand. RN switched out bed for [facility] Toileting- Clothing Manipulation and Hygiene: +2 for physical assistance;+2 for safety/equipment;Sit to/from stand;Cueing for safety;Cueing for sequencing;Maximal assistance         General ADL Comments: Pt will need post acute rehab due to need for A with ADL activity s/p L shoulder surgery.  Being an AKA pt usually uses a walker.  Pt is NWB on LUE at this time     Vision Patient Visual Report: No change from baseline              Pertinent Vitals/Pain Pain Assessment: 0-10 Pain Score: 4  Pain Location: L shoulder Pain Descriptors / Indicators: Grimacing;Dull Pain Intervention(s): Limited activity within patient's tolerance;Monitored during session     Hand Dominance Right   Extremity/Trunk Assessment Upper Extremity Assessment Upper Extremity Assessment: LUE deficits/detail LUE Deficits / Details: s/p L shoulder surgery for L proximal humerus fx       Cervical / Trunk Assessment Cervical / Trunk Assessment: Normal   Communication Communication Communication: No difficulties   Cognition Arousal/Alertness: Awake/alert Behavior During Therapy: WFL for tasks assessed/performed Overall Cognitive Status: Within Functional Limits for tasks assessed  Home Living Family/patient expects to be discharged to:: Private residence Living Arrangements: Alone Available Help at Discharge: Other (Comment)(some A with ADL activity but not consisent) Type of Home: Apartment Home Access: Level entry     Home Layout: One level     Bathroom Shower/Tub: Chief Strategy OfficerTub/shower unit   Bathroom Toilet: Handicapped height     Home Equipment: Wheelchair - manual          Prior Functioning/Environment  Level of Independence: Independent with assistive device(s)                 OT Problem List: Decreased strength;Decreased activity tolerance;Impaired balance (sitting and/or standing);Decreased safety awareness;Decreased knowledge of use of DME or AE;Decreased knowledge of precautions      OT Treatment/Interventions: Self-care/ADL training;Patient/family education;DME and/or AE instruction;Therapeutic activities    OT Goals(Current goals can be found in the care plan section) Acute Rehab OT Goals Patient Stated Goal: be able to get home to her apartment OT Goal Formulation: With patient Time For Goal Achievement: 02/18/19 Potential to Achieve Goals: Good  OT Frequency: Min 2X/week   Barriers to Mcdonald/C: Decreased caregiver support          Co-evaluation              AM-PAC OT "6 Clicks" Daily Activity     Outcome Measure Help from another person eating meals?: A Little Help from another person taking care of personal grooming?: A Little Help from another person toileting, which includes using toliet, bedpan, or urinal?: Total Help from another person bathing (including washing, rinsing, drying)?: A Lot Help from another person to put on and taking off regular upper body clothing?: A Lot Help from another person to put on and taking off regular lower body clothing?: Total 6 Click Score: 12   End of Session Nurse Communication: Mobility status;Weight bearing status  Activity Tolerance: Patient tolerated treatment well Patient left: in chair;with call bell/phone within reach  OT Visit Diagnosis: Unsteadiness on feet (R26.81);Muscle weakness (generalized) (M62.81);Pain Pain - Right/Left: Left Pain - part of body: Shoulder                Time: [id number] OT Time Calculation (min): 44 min Charges:  OT General Charges $OT Visit: 1 Visit OT Evaluation $OT Eval Moderate Complexity: 1 Mod OT Treatments $Self Care/Home Management : 23-37 mins  Lise AuerLori , OT Acute  Rehabilitation Services [phone] Office- [phone]     , Shirley Mcdonald 02/12/2019, 12:55 PM

## 2019-02-12 NOTE — Discharge Summary (Signed)
Patient ID: Shirley Mcdonald MRN: [id number] DOB/AGE: 09/20/1950 68 y.o.  Admit date: 02/11/2019 Discharge date: 02/12/2019  Admission Diagnoses: Left proximal humerus fracture in setting of rotator cuff arthropathy and right lower extremity AKA  Discharge Diagnoses:  Active Problems:   Closed fracture of left proximal humerus   Past Medical History:  Diagnosis Date  . Alpha (0) thalassemia (HCC)    Minor  . Arthritis   . Complication of anesthesia    Combative  . Depression   . Diabetes mellitus without complication ([place])   . Headache   . Hypertension   . Hypothyroidism   . PONV (postoperative nausea and vomiting) 1979  . Sleep apnea      Procedures Performed: Left reverse total shoulder arthroplasty  Discharged Condition: good  Hospital Course: Patient brought in as an outpatient for surgery.  Tolerated procedure well.  Was kept for monitoring overnight for pain control and medical monitoring postop and was found to be stable for DC home the morning after surgery.  Patient was instructed on specific activity restrictions and all questions were answered.  Due to the patient's history of an above-knee amputation and her current status for the last 4 to 5 weeks of transferring without using her left arm she has declined physical therapy and Occupational Therapy.  We feel this is reasonable due to the level of sophistication the patient has with transfers.  She wants to go back home after this discharge.   Consults: None  Significant Diagnostic Studies: No additional pertinent studies  Treatments: Surgery  Discharge Exam:  Dressing CDI and sling well fitting,  full and painless ROM throughout hand with DPC of 0.  Axillary nerve sensation/motor altered in setting of block and unable to be fully tested.  Distal motor and sensory altered in setting of block.   Disposition: Discharge disposition: 01-Home or Self Care       Discharge Instructions    Call MD for:   persistant nausea and vomiting   Complete by: As directed    Call MD for:  redness, tenderness, or signs of infection (pain, swelling, redness, odor or green/yellow discharge around incision site)   Complete by: As directed    Call MD for:  severe uncontrolled pain   Complete by: As directed    Diet - low sodium heart healthy   Complete by: As directed    Discharge instructions   Complete by: As directed    Ophelia Charter MD, MPH [facility]. [address] [phone] (tel)   [phone] (fax)   [place] may leave the operative dressing in place until your follow-up appointment. KEEP THE INCISIONS CLEAN AND DRY. Use the provided ice machine and cuff or Ice as often as possible for the first 3-4 days, then as needed for pain relief.   Make sure to keep something between your skin and the cooling unit to avoid frostbite.  You may shower on Post-Op Day #2. The dressing is water resistant but do not scrub it as it may start to peel up.  You may remove the sling for showering, but keep a water resistant pillow under the arm to keep both the elbow and shoulder away from the body (mimicking the abduction sling). Gently pat the area dry. Do not soak the shoulder in water. Do not go swimming in the pool or ocean until your sutures are removed.  EXERCISES Wear the sling at  all times except when doing your exercises. You may remove the sling for showering, but keep the arm across the chest or in a secondary sling.   Accidental/Purposeful External Rotation and shoulder flexion (reaching behind you) is to be avoided at all costs for the first month. Please perform the exercises:   Elbow / Hand / Wrist  Range of Motion Exercises  FOLLOW-UP If you develop a Fever (>101.5), Redness or Drainage from the surgical incision site, please call our office to arrange for an evaluation. Please call the office to  schedule a follow-up appointment for a wound check, 7-10 days post-operatively.    IF YOU HAVE ANY QUESTIONS, PLEASE FEEL FREE TO CALL OUR OFFICE.   HELPFUL INFORMATION  Your arm will be in a sling following surgery. You will be in this sling for the next 3-4 weeks.  I will let you know the exact duration at your follow-up visit.  You may be more comfortable sleeping in a semi-seated position the first few nights following surgery.  Keep a pillow propped under the elbow and forearm for comfort.  If you have a recliner type of chair it might be beneficial.  If not that is fine too, but it would be helpful to sleep propped up with pillows behind your operated shoulder as well under your elbow and forearm.  This will reduce pulling on the suture lines.  We suggest you use the pain medication the first night prior to going to bed, in order to ease any pain when the anesthesia wears off. You should avoid taking pain medications on an empty stomach as it will make you nauseous.  Do not drink alcoholic beverages or take illicit drugs when taking pain medications.  In most states it is against the law to drive while your arm is in a sling. And certainly against the law to drive while taking narcotics.  You may return to work/school in the next couple of days when you feel up to it. Desk work and typing in the sling is fine.  When dressing, put your operative arm in the sleeve first.  When getting undressed, take your operative arm out last.  Loose fitting, button-down shirts are recommended.  Pain medication may make you constipated.  Below are a few solutions to try in this order: Decrease the amount of pain medication if you aren't having pain. Drink lots of decaffeinated fluids. Drink prune juice and/or each dried prunes  If the first 3 don't work start with additional solutions Take Colace - an over-the-counter stool softener Take Senokot - an over-the-counter laxative Take Miralax - a  stronger over-the-counter laxative   Increase activity slowly   Complete by: As directed      Allergies as of 02/12/2019      Reactions   Hydromorphone Hcl Rash   Oxycontin [oxycodone Hcl] Itching   Tape    Blister   Amlodipine Besy-benazepril Hcl Cough   Saccharin Other (See Comments)      Medication List    STOP taking these medications   ibuprofen 200 MG tablet Commonly known as: ADVIL     TAKE these medications   acetaminophen 500 MG tablet Commonly known as: TYLENOL Take 2 tablets (1,000 mg total) by mouth every 8 (eight) hours for 14 days.   Aspirin Buf(CaCarb-MgCarb-MgO) 81 MG Tabs Take 81 mg by mouth daily.   atorvastatin 20 MG tablet Commonly known as: LIPITOR Take 20 mg by mouth every evening.   buPROPion 300 MG 24  hr tablet Commonly known as: WELLBUTRIN XL Take 300 mg by mouth daily.   calcium-vitamin D 500-200 MG-UNIT tablet Commonly known as: OSCAL WITH D Take 1 tablet by mouth 2 (two) times a day.   celecoxib 100 MG capsule Commonly known as: CeleBREX Take 1 capsule (100 mg total) by mouth 2 (two) times daily.   CO Q-10 PO Take 1 capsule by mouth daily.   colchicine 0.6 MG tablet Take 0.6 mg by mouth 2 (two) times a day.   cyclobenzaprine 5 MG tablet Commonly known as: FLEXERIL Take 5 mg by mouth 2 (two) times a day.   fluticasone 50 MCG/ACT nasal spray Commonly known as: FLONASE Place 2 sprays into both nostrils at bedtime.   HumaLOG KwikPen 100 UNIT/ML KwikPen Generic drug: insulin lispro Inject 1-10 Units into the skin 4 (four) times daily.   Krill Oil 500 MG Caps Take 500 mg by mouth daily.   lactobacillus acidophilus Tabs tablet Take 1 tablet by mouth daily.   lamoTRIgine 150 MG tablet Commonly known as: LAMICTAL Take 150 mg by mouth at bedtime.   Levemir FlexTouch 100 UNIT/ML Pen Generic drug: Insulin Detemir Inject 5 Units into the skin 2 (two) times a day.   levothyroxine 50 MCG tablet Commonly known as: SYNTHROID  Take 50 mcg by mouth daily before breakfast.   Lidocaine 4 % Ptch Place 1 patch onto the skin daily as needed for pain.   losartan-hydrochlorothiazide 100-25 MG tablet Commonly known as: HYZAAR Take 1 tablet by mouth daily.   metFORMIN 500 MG 24 hr tablet Commonly known as: GLUCOPHAGE-XR Take 500 mg by mouth 2 (two) times daily with a meal.   multivitamin with minerals Tabs tablet Take 1 tablet by mouth daily.   Omeprazole 20 MG Tbec Take 20 mg by mouth at bedtime.   ondansetron 4 MG disintegrating tablet Commonly known as: ZOFRAN-ODT Take 4 mg by mouth every 8 (eight) hours as needed for nausea/vomiting.   ondansetron 4 MG tablet Commonly known as: Zofran Take 1 tablet (4 mg total) by mouth every 8 (eight) hours as needed for up to 7 days for nausea or vomiting.   oxyCODONE 5 MG immediate release tablet Commonly known as: Oxy IR/ROXICODONE Take 1-2 pills every 6 hrs as needed for pain, no more than 6 per day   pregabalin 150 MG capsule Commonly known as: LYRICA Take 150 mg by mouth 2 (two) times a day.   propranolol ER 60 MG 24 hr capsule Commonly known as: INDERAL LA Take 60 mg by mouth at bedtime.   traZODone 50 MG tablet Commonly known as: DESYREL Take 50 mg by mouth at bedtime.   venlafaxine XR 150 MG 24 hr capsule Commonly known as: EFFEXOR-XR Take 300 mg by mouth daily with breakfast.   Ventolin HFA 108 (90 Base) MCG/ACT inhaler Generic drug: albuterol Inhale 2 puffs into the lungs every 6 (six) hours as needed for shortness of breath.   Vitamin D3 125 MCG (5000 UT) Tabs Take 5,000 Units by mouth every evening.

## 2019-02-12 NOTE — ED Triage Notes (Addendum)
Patient here from home with complaints of hypoglycemic episode today. States that BS dropped down into the low 50s. Reports left shoulder replacement surgery today. Also reports onset of anxiety during this time.

## 2019-02-12 NOTE — ED Provider Notes (Signed)
[facility]-EMERGENCY DEPT Provider Note   CSN: [id number] Arrival date & time: 02/12/19  2012     History   Chief Complaint Chief Complaint  Patient presents with  . Hypoglycemia  . Anxiety  . Shortness of Breath    HPI Shirley Mcdonald is a 68 y.o. female.     Patient status post shoulder surgery left shoulder was admitted yesterday discharged home today.  Arm is in an immobilizer.  They had discussions while she was in the hospital by going to rehab.  Patient did want a go to rehab.  But now that she got home she is unable to manage without the use of her left arm.  Patient stated that her blood sugars shortly after she got home dropped down to 50.  Patient got diaphoretic.  She ate some food.  Now blood sugars are up in the 200 range.  Patient wants admission to rehab.  Patient got home around 3 in the afternoon and the drop in the blood sugar occurred between 6 and 7 PM.  Patient without any specific complaints.  Did not have a syncopal episode.     Past Medical History:  Diagnosis Date  . Alpha (0) thalassemia (HCC)    Minor  . Arthritis   . Complication of anesthesia    Combative  . Depression   . Diabetes mellitus without complication (HCC)   . Headache   . Hypertension   . Hypothyroidism   . PONV (postoperative nausea and vomiting) 1979  . Sleep apnea     Patient Active Problem List   Diagnosis Date Noted  . Closed fracture of left proximal humerus 02/11/2019    Past Surgical History:  Procedure Laterality Date  . APPENDECTOMY    . ARTHROSCOPY KNEE W/ DRILLING    . CESAREAN SECTION     x2  . CHOLECYSTECTOMY    . EXPLORATORY LAPAROTOMY  1985  . HERNIA REPAIR     x3  . LEG AMPUTATION     AKA  . TONSILLECTOMY       OB History   No obstetric history on file.      Home Medications    Prior to Admission medications   Medication Sig Start Date End Date Taking? Authorizing Provider  acetaminophen (TYLENOL) 500 MG tablet Take 2  tablets (1,000 mg total) by mouth every 8 (eight) hours for 14 days. 02/12/19 02/26/19  Bjorn PippinVarkey, Dax T, MD  albuterol (VENTOLIN HFA) 108 (90 Base) MCG/ACT inhaler Inhale 2 puffs into the lungs every 6 (six) hours as needed for shortness of breath. 08/15/16   [provider]  Aspirin Buf,CaCarb-MgCarb-MgO, 81 MG TABS Take 81 mg by mouth daily.    [provider]  atorvastatin (LIPITOR) 20 MG tablet Take 20 mg by mouth every evening. 06/24/16   [provider]  buPROPion (WELLBUTRIN XL) 300 MG 24 hr tablet Take 300 mg by mouth daily. 08/17/16   [provider]  calcium-vitamin D (OSCAL WITH D) 500-200 MG-UNIT tablet Take 1 tablet by mouth 2 (two) times a day.    [provider]  celecoxib (CELEBREX) 100 MG capsule Take 1 capsule (100 mg total) by mouth 2 (two) times daily. 02/12/19 02/12/20  Bjorn PippinVarkey, Dax T, MD  Cholecalciferol (VITAMIN D3) 125 MCG (5000 UT) TABS Take 5,000 Units by mouth every evening.    [provider]  Coenzyme Q10 (CO Q-10 PO) Take 1 capsule by mouth daily.    [provider]  colchicine  0.6 MG tablet Take 0.6 mg by mouth 2 (two) times a day. 06/25/16   [provider]  cyclobenzaprine (FLEXERIL) 5 MG tablet Take 5 mg by mouth 2 (two) times a day.    [provider]  fluticasone (FLONASE) 50 MCG/ACT nasal spray Place 2 sprays into both nostrils at bedtime.    [provider]  Insulin Detemir (LEVEMIR FLEXTOUCH) 100 UNIT/ML Pen Inject 5 Units into the skin 2 (two) times a day. 07/02/17   [provider]  insulin lispro (HUMALOG KWIKPEN) 100 UNIT/ML KwikPen Inject 1-10 Units into the skin 4 (four) times daily. 08/15/16   [provider]  Boris LownKrill Oil 500 MG CAPS Take 500 mg by mouth daily.    [provider]  lactobacillus acidophilus (BACID) TABS tablet Take 1 tablet by mouth daily.    [provider]  lamoTRIgine (LAMICTAL) 150 MG tablet Take 150 mg by mouth at bedtime.     [provider]  levothyroxine (SYNTHROID) 50 MCG tablet Take 50 mcg by mouth daily before breakfast.    [provider]  Lidocaine 4 % PTCH Place 1 patch onto the skin daily as needed for pain. 01/03/19   [provider]  losartan-hydrochlorothiazide (HYZAAR) 100-25 MG tablet Take 1 tablet by mouth daily. 07/23/17   [provider]  metFORMIN (GLUCOPHAGE-XR) 500 MG 24 hr tablet Take 500 mg by mouth 2 (two) times daily with a meal. 07/25/16   [provider]  Multiple Vitamin (MULTIVITAMIN WITH MINERALS) TABS tablet Take 1 tablet by mouth daily.    [provider]  Omeprazole 20 MG TBEC Take 20 mg by mouth at bedtime. 08/17/16   [provider]  ondansetron (ZOFRAN) 4 MG tablet Take 1 tablet (4 mg total) by mouth every 8 (eight) hours as needed for up to 7 days for nausea or vomiting. 02/12/19 02/19/19  Bjorn PippinVarkey, Dax T, MD  ondansetron (ZOFRAN-ODT) 4 MG disintegrating tablet Take 4 mg by mouth every 8 (eight) hours as needed for nausea/vomiting.    [provider]  oxyCODONE (OXY IR/ROXICODONE) 5 MG immediate release tablet Take 1-2 pills every 6 hrs as needed for pain, no more than 6 per day 02/12/19 02/17/19  Bjorn PippinVarkey, Dax T, MD  pregabalin (LYRICA) 150 MG capsule Take 150 mg by mouth 2 (two) times a day. 09/04/17   [provider]  propranolol ER (INDERAL LA) 60 MG 24 hr capsule Take 60 mg by mouth at bedtime.    [provider]  traZODone (DESYREL) 50 MG tablet Take 50 mg by mouth at bedtime. 08/15/16   [provider]  venlafaxine XR (EFFEXOR-XR) 150 MG 24 hr capsule Take 300 mg by mouth daily with breakfast.    [provider]    Family History No family history on file.  Social History Social History   Tobacco Use  . Smoking status: Former Smoker    Quit date: 2015    Years since quitting: 5.4  . Smokeless tobacco: Never Used  . Tobacco comment: 2015  Substance Use Topics  . Alcohol  use: Never    Frequency: Never  . Drug use: Never     Allergies   Hydromorphone hcl, Oxycontin [oxycodone hcl], Tape, Amlodipine besy-benazepril hcl, and Saccharin   Review of Systems Review of Systems  Constitutional: Positive for diaphoresis. Negative for chills and fever.  HENT: Negative for congestion, rhinorrhea and sore throat.   Eyes: Negative for visual disturbance.  Respiratory: Negative for cough and shortness  of breath.   Cardiovascular: Negative for chest pain and leg swelling.  Gastrointestinal: Negative for abdominal pain, diarrhea, nausea and vomiting.  Genitourinary: Negative for dysuria.  Musculoskeletal: Negative for back pain and neck pain.  Skin: Negative for rash.  Neurological: Negative for dizziness, light-headedness and headaches.  Hematological: Does not bruise/bleed easily.  Psychiatric/Behavioral: Negative for confusion.     Physical Exam Updated Vital Signs BP 94/66   Pulse 80   Temp 99.1 F (37.3 C) (Oral)   Resp 19   Ht 1.549 m (5\' 1" )   Wt 89.8 kg   SpO2 96%   BMI 37.41 kg/m   Physical Exam Vitals signs and nursing note reviewed.  Constitutional:      General: She is not in acute distress.    Appearance: She is well-developed.  HENT:     Head: Normocephalic and atraumatic.  Eyes:     Extraocular Movements: Extraocular movements intact.     Conjunctiva/sclera: Conjunctivae normal.     Pupils: Pupils are equal, round, and reactive to light.  Neck:     Musculoskeletal: Neck supple.  Cardiovascular:     Rate and Rhythm: Normal rate and regular rhythm.     Heart sounds: No murmur.  Pulmonary:     Effort: Pulmonary effort is normal. No respiratory distress.     Breath sounds: Normal breath sounds.  Abdominal:     General: There is distension.     Palpations: Abdomen is soft.     Tenderness: There is no abdominal tenderness.     Comments: Abdomen distended but nontender  Musculoskeletal:     Comments: Left arm is in a padded  immobilizer.  Patient with good cap refill to the fingers.  Patient with right above-the-knee amputation.  Skin:    General: Skin is warm and dry.     Capillary Refill: Capillary refill takes less than 2 seconds.  Neurological:     Mental Status: She is alert.      ED Treatments / Results  Labs (all labs ordered are listed, but only abnormal results are displayed) Labs Reviewed  GLUCOSE, CAPILLARY - Abnormal; Notable for the following components:      Result Value   Glucose-Capillary 237 (*)    All other components within normal limits  CBG MONITORING, ED - Abnormal; Notable for the following components:   Glucose-Capillary 234 (*)    All other components within normal limits  CBG MONITORING, ED    EKG EKG Interpretation  Date/Time:  Thursday February 12 2019 20:31:04 EDT Ventricular Rate:  81 PR Interval:    QRS Duration: 111 QT Interval:  439 QTC Calculation: 510 R Axis:   4 Text Interpretation:  Sinus rhythm Abnormal R-wave progression, early transition Inferior infarct, old Consider anterior infarct Lateral leads are also involved Prolonged QT interval Since last tracing QT has lengthened Confirmed by Daleen Bo [phone]) on 02/12/2019 10:07:13 PM   Radiology Dg Chest 2 View  Result Date: 02/12/2019 CLINICAL DATA:  Shortness of breath EXAM: CHEST - 2 VIEW COMPARISON:  None. FINDINGS: Elevation of the left hemidiaphragm with left basilar atelectasis. Mild cardiomegaly. No pleural effusion or pneumothorax. Mild right basilar opacity, likely atelectasis. IMPRESSION: Mild cardiomegaly and elevation of the left hemidiaphragm with bibasilar atelectasis. Electronically Signed   By: Ulyses Jarred M.D.   On: 02/12/2019 21:13   Dg Shoulder 1v Left  Result Date: 02/11/2019 CLINICAL DATA:  Shoulder pain EXAM: LEFT SHOULDER - 1 VIEW COMPARISON:  CT 02/02/2019 FINDINGS: Three low  resolution intraoperative spot views of the left shoulder. The images demonstrate a left reverse shoulder  replacement with normal alignment. IMPRESSION: Intraoperative fluoroscopic assistance provided during left shoulder replacement Electronically Signed   By: Jasmine PangKim  Fujinaga M.D.   On: 02/11/2019 19:13   Dg Shoulder Left Port  Result Date: 02/11/2019 CLINICAL DATA:  Postop EXAM: LEFT SHOULDER - 1 VIEW COMPARISON:  CT 02/02/2019 FINDINGS: Status post left shoulder replacement with normal alignment. Gas in the soft tissues consistent with recent surgery. Moderate AC joint degenerative changes. Subacromial loose body or bulky osteophyte. Faintly visible healing scapular fracture. IMPRESSION: Status post left shoulder replacement with expected postsurgical changes. Electronically Signed   By: Jasmine PangKim  Fujinaga M.D.   On: 02/11/2019 19:15   Dg C-arm 1-60 Min-no Report  Result Date: 02/11/2019 Fluoroscopy was utilized by the requesting physician.  No radiographic interpretation.    Procedures Procedures (including critical care time)  Medications Ordered in ED Medications  oxyCODONE (Oxy IR/ROXICODONE) immediate release tablet 5 mg (has no administration in time range)  metFORMIN (GLUCOPHAGE) tablet 500 mg (has no administration in time range)  insulin detemir (LEVEMIR) injection 5 Units (has no administration in time range)  ondansetron (ZOFRAN-ODT) disintegrating tablet 4 mg (has no administration in time range)     Initial Impression / Assessment and Plan / ED Course  I have reviewed the triage vital signs and the nursing notes.  Pertinent labs & imaging results that were available during my care of the patient were reviewed by me and considered in my medical decision making (see chart for details).        Patient's blood sugar stabilized here.  However patient states she is unable to manage herself at home and wants to be admitted to rehab.  Not going to be able to facilitate that tonight so patient will stay here in the emergency department social worker can see her in the morning.  Patient's  nurse informed.  Basic medications ordered and diet ordered for patient.  Final Clinical Impressions(s) / ED Diagnoses   Final diagnoses:  Hypoglycemia    ED Discharge Orders    None       Vanetta MuldersZackowski, , MD 02/12/19 [phone]

## 2019-02-13 ENCOUNTER — Encounter (HOSPITAL_COMMUNITY): Payer: Self-pay | Admitting: Orthopaedic Surgery

## 2019-02-13 ENCOUNTER — Other Ambulatory Visit: Payer: Self-pay

## 2019-02-13 LAB — GLUCOSE, CAPILLARY: Glucose-Capillary: 177 mg/dL — ABNORMAL HIGH (ref 70–99)

## 2019-02-13 MED ORDER — ACETAMINOPHEN 325 MG PO TABS
650.0000 mg | ORAL_TABLET | Freq: Once | ORAL | Status: AC
  Administered 2019-02-13: 650 mg via ORAL
  Filled 2019-02-13: qty 2

## 2019-02-13 NOTE — ED Provider Notes (Addendum)
Patient held in the ED overnight to be seen by social work this morning for request for rehab.  She was evaluated by [facility], who states she has home health set up, which will restart once home, and lives in the [facility], an assisted living facility. Pt stable for discharge, morning BG 177.   Little, Wenda Overland, MD 02/13/19 1037  11:04 AM I was contacted by [facility] with daughter on the phone after I discharged patient. Daughter was frustrated that she was not being placed in rehab facility or admitted to hospital. I apologized for her frustration. I explained that patient does not have an admitting diagnosis (hypoglycemia has been resolved and stable) therefore would be rejected for admission by hospitalist. I further explained that even if she was admitted for observation, she would not have a 3-day qualifying stay to be eligible for rehab. Daughter continued to be angry over the phone, stating that she had taken down my name and was going to sue me if mother fell at home, that we were "kicking her to the street." I again apologized for her dissatisfaction but again tried to explain the limitations of the ED and inability to place her directly into rehab from here. We have ensured she is going back to assisted living facility where she will be able to resume home health services.    Little, Wenda Overland, MD 02/13/19 [phone]

## 2019-02-13 NOTE — ED Notes (Addendum)
Pt. Documented in error DG Chest 2 View. 

## 2019-02-13 NOTE — ED Notes (Signed)
Patient eating breakfast will update vitals once finished

## 2019-02-13 NOTE — Progress Notes (Addendum)
CSW contacted patient daughter, Lattie Haw, after being made aware by RN Raylene Miyamoto that patient's daughter was refusing to come get patient from the ED. Daughter reports she does not understand why patient is being d/c back home with home health when patient has a hx of "multiple falls" and cannot care for herself. CSW explained that there is no medical need to seeking SNF placement and there was no diagnosis for patient to be inpatient at the hospital. CSW explained patient came to the ED for low blood sugar reading and has been medically cleared for that. Of note patient saw OT on yesterday and the recommendation was home health. CSW explained that if patient does not improve with home health that her PCP can assist with SNF placement. Patient's daughter began making threats to sue if patient "falls and cracks her skull." Patient's daughter asked for CSW's license number and last name and stated "I am keep notes of this because if my mom gets hurt it is going to fall on you." CSW called daughter in the while in patient's room and patient stated she still was agreeable to try home health.  CSW had EDP, Dr. Rex Kras, speak with daughter to further explain the disposition plan. Please refer to Dr. Eddie Dibbles note about this conversation. Patient's daughter reports she will transport her mother back to her home at the [facility] and will be pick able to pick her up around [place], LCSW [facility] [phone]

## 2019-02-13 NOTE — ED Notes (Signed)
Pt daughter is refusing to pick pt up from the ED until she speaks with social work. Novia, SW notified and is going to contact pt daughter.

## 2019-02-13 NOTE — Progress Notes (Signed)
CSW spoke with patient via bedside. She reports she had surgery on her left shoulder this past Wednesday. Patient reports she was discharged from the ED yesterday and returned home. Once she was home she reports her blood sugar levels dropped and she could not get her hands to use her blood sugar monitor due to her shoulder. Patient return to the ED same day concerned that she can not care for herself. She reports she has support from her daughter. Patient reports she lives at the [facility] and before her shoulder surgery she started with home health through [facility]. Patient was agreeable to returning with [facility]. CSW contacted Adela Lank with [facility] who reports patient is still active with them and can restart services once she is home.  Golden Circle, LCSW Transitions of Care Department [facility] ED [phone]

## 2019-05-07 ENCOUNTER — Other Ambulatory Visit: Payer: Self-pay

## 2019-05-07 DIAGNOSIS — R202 Paresthesia of skin: Secondary | ICD-10-CM

## 2019-06-09 ENCOUNTER — Other Ambulatory Visit: Payer: Self-pay

## 2019-06-09 ENCOUNTER — Encounter: Payer: Self-pay | Admitting: Pulmonary Disease

## 2019-06-09 ENCOUNTER — Ambulatory Visit (INDEPENDENT_AMBULATORY_CARE_PROVIDER_SITE_OTHER): Payer: Medicare Other | Admitting: Pulmonary Disease

## 2019-06-09 VITALS — BP 116/72 | HR 70 | Temp 97.0°F | Ht 62.0 in | Wt 198.0 lb

## 2019-06-09 DIAGNOSIS — Z9989 Dependence on other enabling machines and devices: Secondary | ICD-10-CM

## 2019-06-09 DIAGNOSIS — G4733 Obstructive sleep apnea (adult) (pediatric): Secondary | ICD-10-CM | POA: Diagnosis not present

## 2019-06-09 NOTE — Progress Notes (Signed)
Subjective:    Patient ID: Shirley Mcdonald, female    DOB: June 02, 1951, 68 y.o.   MRN: [id number]  Patient with a history of obstructive sleep apnea diagnosed in 2008  Has been using BiPAP on a regular basis Has been having some mask issues-requires an extra small mask and currently has a small  Recent health problems requiring protracted hospitalization  She does have surgery anticipated soon  [facility]  Usually goes to bed between 2 and 4 AM Takes about 15 minutes to 2 hours to fall asleep Wakes up about 3-4 times during the night Final awakening time between 8 and 10 AM  Significant weight loss of over 50 pounds recently  She feels her current BiPAP works well apart from the mask issues she is having recently  A DME company attempted to deliver BiPAP to her in February but she was hospitalized at the time They did reattempt recently but by then, she had relocated  Past Medical History:  Diagnosis Date  . Alpha (0) thalassemia (HCC)    Minor  . Arthritis   . Complication of anesthesia    Combative  . Depression   . Diabetes mellitus without complication ([place])   . Headache   . Hypertension   . Hypothyroidism   . PONV (postoperative nausea and vomiting) 1979  . Sleep apnea    Social History   Socioeconomic History  . Marital status: Unknown    Spouse name: Not on file  . Number of children: Not on file  . Years of education: Not on file  . Highest education level: Not on file  Occupational History  . Not on file  Social Needs  . Financial resource strain: Not on file  . Food insecurity    Worry: Not on file    Inability: Not on file  . Transportation needs    Medical: Not on file    Non-medical: Not on file  Tobacco Use  . Smoking status: Former Smoker    Packs/day: 1.00    Years: 40.00    Pack years: 40.00    Types: Cigarettes    Quit date: 02/21/2014    Years since quitting: 5.2  . Smokeless tobacco: Never Used  .  Tobacco comment: 2015  Substance and Sexual Activity  . Alcohol use: Never    Frequency: Never  . Drug use: Never  . Sexual activity: Not on file  Lifestyle  . Physical activity    Days per week: Not on file    Minutes per session: Not on file  . Stress: Not on file  Relationships  . Social Herbalist on phone: Not on file    Gets together: Not on file    Attends religious service: Not on file    Active member of club or organization: Not on file    Attends meetings of clubs or organizations: Not on file    Relationship status: Not on file  . Intimate partner violence    Fear of current or ex partner: Not on file    Emotionally abused: Not on file    Physically abused: Not on file    Forced sexual activity: Not on file  Other Topics Concern  . Not on file  Social History Narrative  . Not on file   History reviewed. No pertinent family history.  Review of Systems  Constitutional: Negative.   HENT: Negative.   Eyes: Negative.  Respiratory: Negative.  Negative for shortness of breath.   Cardiovascular: Negative.   Musculoskeletal: Positive for arthralgias.  Neurological: Positive for headaches.  Psychiatric/Behavioral: Positive for dysphoric mood.       Objective:   Physical Exam Constitutional:      Appearance: She is obese.  HENT:     Head: Normocephalic and atraumatic.     Nose: No congestion.     Mouth/Throat:     Mouth: Mucous membranes are moist.  Eyes:     General:        Right eye: No discharge.        Left eye: No discharge.     Extraocular Movements: Extraocular movements intact.     Pupils: Pupils are equal, round, and reactive to light.  Neck:     Musculoskeletal: Normal range of motion. No neck rigidity.  Cardiovascular:     Rate and Rhythm: Normal rate and regular rhythm.     Pulses: Normal pulses.     Heart sounds: Normal heart sounds. No murmur.  Pulmonary:     Effort: Pulmonary effort is normal. No respiratory distress.      Breath sounds: Normal breath sounds. No stridor. No wheezing, rhonchi or rales.  Musculoskeletal: Normal range of motion.        General: No swelling.  Skin:    General: Skin is warm and dry.  Neurological:     Mental Status: She is alert.    Vitals:   06/09/19 1120 06/09/19 1121  BP:  116/72  Pulse:  70  Temp: (!) 97 F (36.1 C)   SpO2:  97%   Results of the Epworth flowsheet 06/09/2019  Sitting and reading 2  Watching TV 2  Sitting, inactive in a public place (e.g. a theatre or a meeting) 0  As a passenger in a car for an hour without a break 1  Lying down to rest in the afternoon when circumstances permit 1  Sitting and talking to someone 1  Sitting quietly after a lunch without alcohol 1  In a car, while stopped for a few minutes in traffic 0  Total score 8      Assessment & Plan:  .  Obstructive sleep apnea -We will attempt to obtain old sleep study -Attempt to contact a DME company to figure out what is needed, if the home sleep study can be used as a basis for prescription -If not she may require a home sleep study for diagnosis and then BiPAP therapy   She currently is on BiPAP and compliant -She requires a different mask  Continue lines of care  Plan: Attempt to obtain old sleep study Continue current BiPAP-she is currently on pressure settings of 13-15/9-11  I will see her back in about 3 months  May require home sleep study

## 2019-06-09 NOTE — Patient Instructions (Signed)
Obstructive sleep apnea  We will try and obtain the last study from 2016 We will try and contact [facility]-to make sure that the study is valid for new prescription  If it is not valid for new prescription You will require a home sleep study  I will see you back in 3 months  Continue using your current BiPAP as tolerated

## 2019-06-16 ENCOUNTER — Ambulatory Visit (INDEPENDENT_AMBULATORY_CARE_PROVIDER_SITE_OTHER): Payer: Medicare Other | Admitting: Neurology

## 2019-06-16 ENCOUNTER — Other Ambulatory Visit: Payer: Self-pay

## 2019-06-16 DIAGNOSIS — R202 Paresthesia of skin: Secondary | ICD-10-CM

## 2019-06-16 DIAGNOSIS — M5412 Radiculopathy, cervical region: Secondary | ICD-10-CM

## 2019-06-16 DIAGNOSIS — G5622 Lesion of ulnar nerve, left upper limb: Secondary | ICD-10-CM

## 2019-06-16 NOTE — Procedures (Signed)
[facility]  [address]  [place], [place] [address] Tel: [phone] Fax:  [phone] Test Date:  06/16/2019  Patient: Shirley Mcdonald DOB: 11-05-1950 Physician: Narda Amber, DO  Sex: Female Height: 5\' 2"  Ref Phys: Ophelia Charter, MD  ID#: [id number] Temp: 32.0C Technician:    Patient Complaints: This is a 68 year old female with diabetes and history of left shoulder arthroplasty referred for evaluation of left hand numbness/tingling and weakness.  NCV & EMG Findings: Extensive electrodiagnostic testing of the left upper extremity shows:  1. Left ulnar sensory response is absent.  Left median sensory responses within normal limits. 2. Left median motor response shows reduced amplitude (2.3 mV).  Left ulnar motor response shows markedly reduced amplitude at the abductor digit he minimi and first dorsal interosseous muscles (L2.8, L3.9 mV) and decreased conduction velocity (A Elbow-B Elbow, L43, L45 m/s).  3. Chronic motor axonal loss changes are seen affecting the left first dorsal interosseous, abductor digit he minimi, abductor pollicis brevis, biceps and deltoid muscles.  Fibrillation potentials are isolated to the left abductor digiti minimi muscle.  Impression: 1. 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 2. Chronic T1 radiculopathy affecting the left upper extremity, moderate-to-severe. 3. Chronic C5 radiculopathy affecting the left upper extremity, mild.   ___________________________ Narda Amber, DO    Nerve Conduction Studies Anti Sensory Summary Table   Site NR Peak (ms) Norm Peak (ms) P-T Amp (V) Norm P-T Amp  Left Median Anti Sensory (2nd Digit)  32.5C  Wrist    3.1 <3.8 30.5 >10  Left Ulnar Anti Sensory (5th Digit)  32.5C  Wrist NR  <3.2  >5   Motor Summary Table   Site NR Onset (ms) Norm Onset (ms) O-P Amp (mV) Norm O-P Amp Site1 Site2 Delta-0 (ms) Dist  (cm) Vel (m/s) Norm Vel (m/s)  Left Median Motor (Abd Poll Brev)  32.5C  Wrist    4.0 <4.0 2.3 >5 Elbow Wrist 5.6 28.0 50 >50  Elbow    9.6  1.9         Left Ulnar Motor (Abd Dig Minimi)  32.5C  Wrist    3.0 <3.1 2.8 >7 B Elbow Wrist 4.0 23.0 58 >50  B Elbow    7.0  2.6  A Elbow B Elbow 2.3 10.0 43 >50  A Elbow    9.3  2.5         Left Ulnar (FDI) Motor (1st DI)  32.5C  Wrist    3.8 <4.5 3.9 >7 B Elbow Wrist 4.4 27.0 61 >50  B Elbow    8.2  3.5  A Elbow B Elbow 2.2 10.0 45 >50  A Elbow    10.4  3.1          EMG   Side Muscle Ins Act Fibs Psw Fasc Number Recrt Dur Dur. Amp Amp. Poly Poly. Comment  Left 1stDorInt Nml Nml Nml Nml 3- Rapid Most 1+ Most 1+ Most 1+ ATR  Left ABD Dig Min Nml 1+ Nml Nml SMU Rapid All 1+ All 1+ All 1+ ATR  Left FlexCarpiUln Nml Nml Nml Nml Nml Nml Nml Nml Nml Nml Nml Nml N/A  Left Abd Poll Brev Nml Nml Nml Nml 3- Rapid All 1+ All 1+ All 1+ N/A  Left Ext Indicis Nml Nml Nml Nml Nml Nml Nml Nml Nml Nml Nml Nml N/A  Left PronatorTeres Nml Nml Nml Nml Nml Nml Nml  Nml Nml Nml Nml Nml N/A  Left Biceps Nml Nml Nml Nml 1- Rapid Feww 1+ Few 1+ Nml Nml N/A  Left Triceps Nml Nml Nml Nml Nml Nml Nml Nml Nml Nml Nml Nml N/A  Left Deltoid Nml Nml Nml Nml 1- Rapid Some 1+ Some 1+ Nml Nml N/A      Waveforms:

## 2019-06-24 ENCOUNTER — Telehealth: Payer: Self-pay | Admitting: Pulmonary Disease

## 2019-06-24 DIAGNOSIS — G4733 Obstructive sleep apnea (adult) (pediatric): Secondary | ICD-10-CM

## 2019-06-24 NOTE — Telephone Encounter (Signed)
[facility] is closed. Will call [facility] in AM. Will leave in triage.

## 2019-06-25 NOTE — Telephone Encounter (Signed)
Called and spoke to pt. Pt is requesting a new CPAP machine. Pt states her current one is 67 years old. Pt also stating she wants to change her mask from nasal mask to a small full face mask. Pt last seen 06/09/2019.    Dr. Ander Slade please advise if ok to place order for anew CPAP machine. Thanks.

## 2019-06-30 NOTE — Telephone Encounter (Signed)
Okay to place order for CPAP  If she requires a re-study We can go ahead and order a home sleep study

## 2019-06-30 NOTE — Telephone Encounter (Signed)
Spoke with patient. She is aware that [facility] has approved the RX for a bipap machine. Reviewed patient's chart for settings.  Plan: Attempt to obtain old sleep study Continue current BiPAP-she is currently on pressure settings of 13-15/9-11   [facility], please advise which setting is for the ipap and epap. Thanks!

## 2019-06-30 NOTE — Telephone Encounter (Signed)
From what is documented  IPAP is 13-15 EPAP is 9-11  DME may be able to tell you what she is set on at present Range of pressures is usually if you are using an auto titrating machine  This means she could be on 13/9 or 15/9 or 15/11 -What is most important is compliance and monitoring

## 2019-06-30 NOTE — Telephone Encounter (Signed)
Spoke with Mikeal Hawthorne at [facility]. Provided him with the patient's demographics. He stated that the patient is not active in their system. Provided him with the information again. He still could not pull up patient's information.   Will go ahead and place the order based on the information we have.   Nothing further needed at time of call.

## 2019-08-06 NOTE — H&P (Signed)
PREOPERATIVE H&P  Chief Complaint: Left cubital tunnel syndrome  HPI: Shirley Mcdonald is a 68 y.o. female who is scheduled for LEFT ELBOW ULNAR NERVE DISCOMPRESSION AND TRANSPOSITION.   Patient has a past medical history significant for diabetes, hypertension and coronary artery disease with a murmur.   The patient has been having numbness in her small and ring finger.  She is very debilitated by this.  She now finds that her shoulder is much better than it was prior to her left reverse shoulder arthroplasty in the setting of arthritis and fracture, but done for fracture on 02-11-2019. She continues to describe numbness and burning in her left fingers.   Her symptoms are rated as moderate to severe, and have been worsening.  This is significantly impairing activities of daily living.    Please see clinic note for further details on this patient's care.    She has elected for surgical management.    Social History   Socioeconomic History  . Marital status: Unknown    Spouse name: Not on file  . Number of children: Not on file  . Years of education: Not on file  . Highest education level: Not on file  Occupational History  . Not on file  Tobacco Use  . Smoking status: Not on file  Substance and Sexual Activity  . Alcohol use: Not on file  . Drug use: Not on file  . Sexual activity: Not on file  Other Topics Concern  . Not on file  Social History Narrative  . Not on file   Social Determinants of Health   Financial Resource Strain:   . Difficulty of Paying Living Expenses: Not on file  Food Insecurity:   . Worried About Charity fundraiser in the Last Year: Not on file  . Ran Out of Food in the Last Year: Not on file  Transportation Needs:   . Lack of Transportation (Medical): Not on file  . Lack of Transportation (Non-Medical): Not on file  Physical Activity:   . Days of Exercise per Week: Not on file  . Minutes of Exercise per Session: Not on file  Stress:   .  Feeling of Stress : Not on file  Social Connections:   . Frequency of Communication with Friends and Family: Not on file  . Frequency of Social Gatherings with Friends and Family: Not on file  . Attends Religious Services: Not on file  . Active Member of Clubs or Organizations: Not on file  . Attends Archivist Meetings: Not on file  . Marital Status: Not on file   No family history on file. Allergies  Allergen Reactions  . Adhesive [Tape]     Causes blistering  . Hydrocodone-Acetaminophen Other (See Comments)    Makes face itch   . Hydromorphone     Sleepy  . Lotrel [Amlodipine Besy-Benazepril Hcl] Cough  . Saccharin     Causes stomach pain    Prior to Admission medications   Medication Sig Start Date End Date Taking? Authorizing Provider  albuterol (VENTOLIN HFA) 108 (90 Base) MCG/ACT inhaler Inhale 2 puffs into the lungs every 6 (six) hours as needed for wheezing or shortness of breath.   Yes [provider]  atorvastatin (LIPITOR) 20 MG tablet Take 20 mg by mouth daily.   Yes [provider]  buPROPion (WELLBUTRIN XL) 300 MG 24 hr tablet Take 300 mg by mouth daily.   Yes [provider]  colchicine 0.6 MG  tablet Take 0.6 mg by mouth 2 (two) times daily.   Yes [provider]  lamoTRIgine (LAMICTAL) 150 MG tablet Take 150 mg by mouth at bedtime.   Yes [provider]  levothyroxine (SYNTHROID) 50 MCG tablet Take 50 mcg by mouth daily before breakfast.   Yes [provider]  losartan-hydrochlorothiazide (HYZAAR) 100-25 MG tablet Take 1 tablet by mouth daily.   Yes [provider]  metFORMIN (GLUCOPHAGE-XR) 500 MG 24 hr tablet Take 500 mg by mouth daily with breakfast.   Yes [provider]  omeprazole (PRILOSEC) 20 MG capsule Take 20 mg by mouth daily.   Yes [provider]  pregabalin (LYRICA) 150 MG capsule Take 150 mg by mouth 2 (two) times daily.   Yes [provider]    propranolol ER (INDERAL LA) 60 MG 24 hr capsule Take 60 mg by mouth daily.   Yes [provider]  venlafaxine XR (EFFEXOR-XR) 75 MG 24 hr capsule Take 225 mg by mouth daily with breakfast.   Yes [provider]    ROS: All other systems have been reviewed and were otherwise negative with the exception of those mentioned in the HPI and as above.  Physical Exam: General: Alert, no acute distress Cardiovascular: No pedal edema Respiratory: No cyanosis, no use of accessory musculature GI: No organomegaly, abdomen is soft and non-tender Skin: No lesions in the area of chief complaint Neurologic: Sensation intact distally Psychiatric: Patient is competent for consent with normal mood and affect Lymphatic: No axillary or cervical lymphadenopathy  MUSCULOSKELETAL:  Left elbow: Positive Tinel's at the cubital tunnel.  She has range of motion from about 35 to 115 in the elbow with 80% pronosupination residually.  She has weakness in her ulnar motor function compared to the contralateral side, but no obvious interosseous wasting.  Imaging: Two views of the left elbow demonstrate end-stage bony arthritis to the ulnohumeral joint with significant osteophytosis both anteriorly and posteriorly.    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    Plan: Plan for Procedure(s): LEFT ELBOW ULNAR NERVE DISCOMPRESSION AND TRANSPOSITION  We think the patient would benefit from a cubital tunnel release and transposition of the nerve.   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   The risks benefits and alternatives were discussed with the patient including but not limited to the risks of nonoperative treatment, versus surgical intervention  including infection, bleeding, nerve injury,  blood clots, cardiopulmonary complications, morbidity, mortality, among others, and they were willing to proceed.   The patient acknowledged the explanation, agreed to proceed with the plan and consent was signed.   Operative Plan: left ulnar nerve decompression and transposition Discharge Medications: Tylenol, Celebrex, Oxycodone, Zofran DVT Prophylaxis: None Special Discharge needs: No splint   Vernetta Honey, PA-C  08/06/2019 2:55 PM

## 2019-08-06 NOTE — Patient Instructions (Addendum)
DUE TO COVID-19 ONLY ONE VISITOR IS ALLOWED TO COME WITH YOU AND STAY IN THE WAITING ROOM ONLY DURING PRE OP AND PROCEDURE DAY OF SURGERY. THE 1 VISITOR MAY VISIT WITH YOU AFTER SURGERY IN YOUR PRIVATE ROOM DURING VISITING HOURS ONLY!  YOU NEED TO HAVE A COVID 19 TEST ON: 08/08/2019 @ 11:00 AM.THIS TEST MUST BE DONE BEFORE SURGERY, COME  [address], [place] , [address].  ([place]) ONCE YOUR COVID TEST IS COMPLETED, PLEASE BEGIN THE QUARANTINE INSTRUCTIONS AS OUTLINED IN YOUR HANDOUT.                Shannara Tusts    Your procedure is scheduled on: 08/12/2019   Report to [facility] Main  Entrance   Report to: admitting at: 8:00 AM     Call this number if you have problems the morning of surgery [phone]    Remember: Do not eat food or drink liquids :After Midnight.   BRUSH YOUR TEETH MORNING OF SURGERY AND RINSE YOUR MOUTH OUT, NO CHEWING GUM CANDY OR MINTS.     NO SOLID FOOD AFTER MIDNIGHT THE NIGHT PRIOR TO SURGERY. NOTHING BY MOUTH EXCEPT CLEAR LIQUIDS UNTIL 7:00 AM. PLEASE FINISH GATORADE DRINK PER SURGEON ORDER  WHICH NEEDS TO BE COMPLETED AT : 7:00 AM  Take these medicines the morning of surgery with A SIP OF WATER: Wellbutrin,Colchicine,Synthroid,,Effexor.    How to Manage Your Diabetes Before and After Surgery  Why is it important to control my blood sugar before and after surgery? . Improving blood sugar levels before and after surgery helps healing and can limit problems. . A way of improving blood sugar control is eating a healthy diet by: o  Eating less sugar and carbohydrates o  Increasing activity/exercise o  Talking with your doctor about reaching your blood sugar goals . High blood sugars (greater than 180 mg/dL) can raise your risk of infections and slow your recovery, so you will need to focus on controlling your diabetes during the weeks before surgery. . Make sure that the doctor who takes care of your diabetes knows  about your planned surgery including the date and location.  How do I manage my blood sugar before surgery? . Check your blood sugar at least 4 times a day, starting 2 days before surgery, to make sure that the level is not too high or low. o Check your blood sugar the morning of your surgery when you wake up and every 2 hours until you get to the Short Stay unit. . If your blood sugar is less than 70 mg/dL, you will need to treat for low blood sugar: o Do not take insulin. o Treat a low blood sugar (less than 70 mg/dL) with  cup of clear juice (cranberry or apple), 4 glucose tablets, OR glucose gel. o Recheck blood sugar in 15 minutes after treatment (to make sure it is greater than 70 mg/dL). If your blood sugar is not greater than 70 mg/dL on recheck, call [phone] for further instructions. . Report your blood sugar to the short stay nurse when you get to Short Stay.  . If you are admitted to the hospital after surgery: o Your blood sugar will be checked by the staff and you will probably be given insulin after surgery (instead of oral diabetes medicines) to make sure you have good blood sugar levels. o The goal for blood sugar control after surgery is 80-180 mg/dL.   WHAT DO I DO  ABOUT MY DIABETES MEDICATION?  Marland Kitchen Do not take oral diabetes medicines (pills) the morning of surgery.       Take your Metformin as usual the day before your surgery.   Do not take your Metformin the day of your surgery.      Patient Signature:  Date:   Nurse Signature:  Date:   Reviewed and Endorsed by [facility] Patient Education Committee, August 2015                                  You may not have any metal on your body including hair pins and              piercings  Do not wear jewelry, make-up, lotions, powders or perfumes, deodorant             Do not wear nail polish on your fingernails.  Do not shave  48 hours prior to surgery.                 Do not bring valuables to the  hospital. [facility] IS NOT             RESPONSIBLE   FOR VALUABLES.  Contacts, dentures or bridgework may not be worn into surgery.  Leave suitcase in the car. After surgery it may be brought to your room.     Patients discharged the day of surgery will not be allowed to drive home. IF YOU ARE HAVING SURGERY AND GOING HOME THE SAME DAY, YOU MUST HAVE AN ADULT TO DRIVE YOU HOME AND BE WITH YOU FOR 24 HOURS. YOU MAY GO HOME BY TAXI OR UBER OR ORTHERWISE, BUT AN ADULT MUST ACCOMPANY YOU HOME AND STAY WITH YOU FOR 24 HOURS.  Name and phone number of your driver:  Special Instructions: N/A              Please read over the following fact sheets you were given: _____________________________________________________________________             [facility] - Preparing for Surgery Before surgery, you can play an important role.  Because skin is not sterile, your skin needs to be as free of germs as possible.  You can reduce the number of germs on your skin by washing with CHG (chlorahexidine gluconate) soap before surgery.  CHG is an antiseptic cleaner which kills germs and bonds with the skin to continue killing germs even after washing. Please DO NOT use if you have an allergy to CHG or antibacterial soaps.  If your skin becomes reddened/irritated stop using the CHG and inform your nurse when you arrive at Short Stay. Do not shave (including legs and underarms) for at least 48 hours prior to the first CHG shower.  You may shave your face/neck. Please follow these instructions carefully:  1.  Shower with CHG Soap the night before surgery and the  morning of Surgery.  2.  If you choose to wash your hair, wash your hair first as usual with your  normal  shampoo.  3.  After you shampoo, rinse your hair and body thoroughly to remove the  shampoo.                           4.  Use CHG as you would any other liquid soap.  You can apply chg directly  to the skin and  wash                       Gently with a  scrungie or clean washcloth.  5.  Apply the CHG Soap to your body ONLY FROM THE NECK DOWN.   Do not use on face/ open                           Wound or open sores. Avoid contact with eyes, ears mouth and genitals (private parts).                       Wash face,  Genitals (private parts) with your normal soap.             6.  Wash thoroughly, paying special attention to the area where your surgery  will be performed.  7.  Thoroughly rinse your body with warm water from the neck down.  8.  DO NOT shower/wash with your normal soap after using and rinsing off  the CHG Soap.                9.  Pat yourself dry with a clean towel.            10.  Wear clean pajamas.            11.  Place clean sheets on your bed the night of your first shower and do not  sleep with pets. Day of Surgery : Do not apply any lotions/deodorants the morning of surgery.  Please wear clean clothes to the hospital/surgery center.  FAILURE TO FOLLOW THESE INSTRUCTIONS MAY RESULT IN THE CANCELLATION OF YOUR SURGERY PATIENT SIGNATURE_________________________________  NURSE SIGNATURE__________________________________  ________________________________________________________________________

## 2019-08-07 ENCOUNTER — Encounter (HOSPITAL_COMMUNITY)
Admission: RE | Admit: 2019-08-07 | Discharge: 2019-08-07 | Disposition: A | Discharge status: Home / Self Care | Payer: Medicare Other | Source: Ambulatory Visit | Attending: Orthopaedic Surgery | Admitting: Orthopaedic Surgery

## 2019-08-07 ENCOUNTER — Other Ambulatory Visit: Payer: Self-pay

## 2019-08-07 ENCOUNTER — Encounter (HOSPITAL_COMMUNITY): Payer: Self-pay

## 2019-08-07 DIAGNOSIS — E118 Type 2 diabetes mellitus with unspecified complications: Secondary | ICD-10-CM | POA: Insufficient documentation

## 2019-08-07 DIAGNOSIS — Z01818 Encounter for other preprocedural examination: Secondary | ICD-10-CM | POA: Diagnosis present

## 2019-08-07 HISTORY — DX: Cardiac arrhythmia, unspecified: I49.9

## 2019-08-07 HISTORY — DX: Thalassemia minor: D56.3

## 2019-08-07 HISTORY — DX: Personal history of other diseases of the digestive system: Z87.19

## 2019-08-07 HISTORY — DX: Pneumonia, unspecified organism: J18.9

## 2019-08-07 HISTORY — DX: Periodic fever syndromes: M04.1

## 2019-08-07 HISTORY — DX: Unspecified asthma, uncomplicated: J45.909

## 2019-08-07 LAB — CBC
HCT: 33.7 % — ABNORMAL LOW (ref 36.0–46.0)
Hemoglobin: 11.4 g/dL — ABNORMAL LOW (ref 12.0–15.0)
MCH: 27.1 pg (ref 26.0–34.0)
MCHC: 33.8 g/dL (ref 30.0–36.0)
MCV: 80 fL (ref 80.0–100.0)
Platelets: 167 10*3/uL (ref 150–400)
RBC: 4.21 MIL/uL (ref 3.87–5.11)
RDW: 17.8 % — ABNORMAL HIGH (ref 11.5–15.5)
WBC: 5.8 10*3/uL (ref 4.0–10.5)
nRBC: 0 % (ref 0.0–0.2)

## 2019-08-07 LAB — HEMOGLOBIN A1C
Hgb A1c MFr Bld: 5.6 % (ref 4.8–5.6)
Mean Plasma Glucose: 114.02 mg/dL

## 2019-08-07 LAB — BASIC METABOLIC PANEL
Anion gap: 10 (ref 5–15)
BUN: 8 mg/dL (ref 8–23)
CO2: 31 mmol/L (ref 22–32)
Calcium: 9.4 mg/dL (ref 8.9–10.3)
Chloride: 97 mmol/L — ABNORMAL LOW (ref 98–111)
Creatinine, Ser: 0.5 mg/dL (ref 0.44–1.00)
GFR calc Af Amer: >60 mL/min (ref >60)
GFR calc non Af Amer: >60 mL/min (ref >60)
Glucose, Bld: 119 mg/dL — ABNORMAL HIGH (ref 70–99)
Potassium: 3 mmol/L — ABNORMAL LOW (ref 3.5–5.1)
Sodium: 138 mmol/L (ref 135–145)

## 2019-08-07 LAB — GLUCOSE, CAPILLARY
Glucose-Capillary: 113 mg/dL — ABNORMAL HIGH (ref 70–99)
Glucose-Capillary: 113 mg/dL — ABNORMAL HIGH (ref 70–99)

## 2019-08-07 NOTE — Progress Notes (Signed)
PCP - Anastasia Pall. LOV 06/2019 Cardiologist -   Chest x-ray -  EKG - 08/07/2019 Stress Test -  ECHO -  Cardiac Cath -   Sleep Study - yes CPAP - No using at the moment  Fasting Blood Sugar -  Checks Blood Sugar _____ times a day  Blood Thinner Instructions:N/A Aspirin Instructions: Last Dose:  Anesthesia review:   Patient denies shortness of breath, fever, cough and chest pain at PAT appointment   Patient verbalized understanding of instructions that were given to them at the PAT appointment. Patient was also instructed that they will need to review over the PAT instructions again at home before surgery.

## 2019-08-08 ENCOUNTER — Other Ambulatory Visit (HOSPITAL_COMMUNITY)
Admission: RE | Admit: 2019-08-08 | Discharge: 2019-08-08 | Disposition: A | Discharge status: Home / Self Care | Payer: Medicare Other | Source: Ambulatory Visit | Attending: Orthopaedic Surgery | Admitting: Orthopaedic Surgery

## 2019-08-08 DIAGNOSIS — Z01812 Encounter for preprocedural laboratory examination: Secondary | ICD-10-CM | POA: Diagnosis present

## 2019-08-08 DIAGNOSIS — Z20828 Contact with and (suspected) exposure to other viral communicable diseases: Secondary | ICD-10-CM | POA: Insufficient documentation

## 2019-08-09 LAB — NOVEL CORONAVIRUS, NAA (HOSP ORDER, SEND-OUT TO REF LAB; TAT 18-24 HRS): SARS-CoV-2, NAA: NOT DETECTED

## 2019-08-10 ENCOUNTER — Encounter: Payer: Self-pay | Admitting: Pulmonary Disease

## 2019-08-11 NOTE — Progress Notes (Signed)
TCT patient requesting arrival to Short Stay at 07:30 08/12/19.  Patient verbalizes understanding.

## 2019-08-12 ENCOUNTER — Encounter (HOSPITAL_COMMUNITY): Payer: Self-pay

## 2019-08-12 ENCOUNTER — Ambulatory Visit (HOSPITAL_COMMUNITY): Payer: Medicare Other

## 2019-08-12 ENCOUNTER — Emergency Department (HOSPITAL_COMMUNITY)
Admission: EM | Admit: 2019-08-12 | Discharge: 2019-08-12 | Disposition: A | Discharge status: Home / Self Care | Payer: Medicare Other | Source: Home / Self Care | Attending: Emergency Medicine | Admitting: Emergency Medicine

## 2019-08-12 ENCOUNTER — Encounter (HOSPITAL_COMMUNITY): Payer: Self-pay | Admitting: Orthopaedic Surgery

## 2019-08-12 ENCOUNTER — Encounter (HOSPITAL_COMMUNITY)
Admission: RE | Disposition: A | Discharge status: Home / Self Care | Payer: Self-pay | Source: Ambulatory Visit | Attending: Orthopaedic Surgery

## 2019-08-12 ENCOUNTER — Ambulatory Visit (HOSPITAL_COMMUNITY)
Admission: RE | Admit: 2019-08-12 | Discharge: 2019-08-12 | Disposition: A | Discharge status: Home / Self Care | Payer: Medicare Other | Source: Ambulatory Visit | Attending: Orthopaedic Surgery | Admitting: Orthopaedic Surgery

## 2019-08-12 ENCOUNTER — Other Ambulatory Visit: Payer: Self-pay

## 2019-08-12 ENCOUNTER — Ambulatory Visit (HOSPITAL_COMMUNITY): Payer: Medicare Other | Admitting: Physician Assistant

## 2019-08-12 DIAGNOSIS — G4730 Sleep apnea, unspecified: Secondary | ICD-10-CM | POA: Diagnosis not present

## 2019-08-12 DIAGNOSIS — Z791 Long term (current) use of non-steroidal anti-inflammatories (NSAID): Secondary | ICD-10-CM | POA: Diagnosis not present

## 2019-08-12 DIAGNOSIS — I2510 Atherosclerotic heart disease of native coronary artery without angina pectoris: Secondary | ICD-10-CM | POA: Diagnosis not present

## 2019-08-12 DIAGNOSIS — Z888 Allergy status to other drugs, medicaments and biological substances status: Secondary | ICD-10-CM | POA: Diagnosis not present

## 2019-08-12 DIAGNOSIS — M25722 Osteophyte, left elbow: Secondary | ICD-10-CM | POA: Diagnosis not present

## 2019-08-12 DIAGNOSIS — E1140 Type 2 diabetes mellitus with diabetic neuropathy, unspecified: Secondary | ICD-10-CM | POA: Insufficient documentation

## 2019-08-12 DIAGNOSIS — E039 Hypothyroidism, unspecified: Secondary | ICD-10-CM | POA: Insufficient documentation

## 2019-08-12 DIAGNOSIS — G5622 Lesion of ulnar nerve, left upper limb: Secondary | ICD-10-CM | POA: Diagnosis present

## 2019-08-12 DIAGNOSIS — Z7984 Long term (current) use of oral hypoglycemic drugs: Secondary | ICD-10-CM | POA: Insufficient documentation

## 2019-08-12 DIAGNOSIS — Z7982 Long term (current) use of aspirin: Secondary | ICD-10-CM | POA: Diagnosis not present

## 2019-08-12 DIAGNOSIS — Z79899 Other long term (current) drug therapy: Secondary | ICD-10-CM | POA: Diagnosis not present

## 2019-08-12 DIAGNOSIS — G8918 Other acute postprocedural pain: Secondary | ICD-10-CM

## 2019-08-12 DIAGNOSIS — D560 Alpha thalassemia: Secondary | ICD-10-CM | POA: Diagnosis not present

## 2019-08-12 DIAGNOSIS — M24022 Loose body in left elbow: Secondary | ICD-10-CM | POA: Insufficient documentation

## 2019-08-12 DIAGNOSIS — J45909 Unspecified asthma, uncomplicated: Secondary | ICD-10-CM | POA: Insufficient documentation

## 2019-08-12 DIAGNOSIS — M19022 Primary osteoarthritis, left elbow: Secondary | ICD-10-CM | POA: Insufficient documentation

## 2019-08-12 DIAGNOSIS — Z87891 Personal history of nicotine dependence: Secondary | ICD-10-CM | POA: Insufficient documentation

## 2019-08-12 DIAGNOSIS — Z89619 Acquired absence of unspecified leg above knee: Secondary | ICD-10-CM | POA: Insufficient documentation

## 2019-08-12 DIAGNOSIS — Z794 Long term (current) use of insulin: Secondary | ICD-10-CM | POA: Insufficient documentation

## 2019-08-12 DIAGNOSIS — I10 Essential (primary) hypertension: Secondary | ICD-10-CM | POA: Diagnosis not present

## 2019-08-12 DIAGNOSIS — Z96612 Presence of left artificial shoulder joint: Secondary | ICD-10-CM | POA: Diagnosis not present

## 2019-08-12 DIAGNOSIS — Z79890 Hormone replacement therapy (postmenopausal): Secondary | ICD-10-CM | POA: Insufficient documentation

## 2019-08-12 DIAGNOSIS — Z09 Encounter for follow-up examination after completed treatment for conditions other than malignant neoplasm: Secondary | ICD-10-CM

## 2019-08-12 DIAGNOSIS — Z885 Allergy status to narcotic agent status: Secondary | ICD-10-CM | POA: Diagnosis not present

## 2019-08-12 DIAGNOSIS — F329 Major depressive disorder, single episode, unspecified: Secondary | ICD-10-CM | POA: Diagnosis not present

## 2019-08-12 HISTORY — PX: ULNAR NERVE TRANSPOSITION: SHX2595

## 2019-08-12 SURGERY — ULNAR NERVE DECOMPRESSION/TRANSPOSITION
Anesthesia: General | Laterality: Left

## 2019-08-12 MED ORDER — MIDAZOLAM HCL 2 MG/2ML IJ SOLN: 1.0000 mg | Freq: Once | INTRAMUSCULAR | Status: AC

## 2019-08-12 MED ORDER — LACTATED RINGERS IV SOLN: INTRAVENOUS | Status: DC

## 2019-08-12 MED ORDER — MIDAZOLAM HCL 2 MG/2ML IJ SOLN
INTRAMUSCULAR | Status: AC
  Administered 2019-08-12: 1 mg via INTRAVENOUS
  Filled 2019-08-12: qty 2

## 2019-08-12 MED ORDER — CELECOXIB 200 MG PO CAPS: 200.0000 mg | ORAL_CAPSULE | Freq: Two times a day (BID) | ORAL | 1 refills | Status: AC

## 2019-08-12 MED ORDER — FENTANYL CITRATE (PF) 100 MCG/2ML IJ SOLN: 25.0000 ug | INTRAMUSCULAR | Status: DC | PRN

## 2019-08-12 MED ORDER — FENTANYL CITRATE (PF) 100 MCG/2ML IJ SOLN
INTRAMUSCULAR | Status: AC
  Filled 2019-08-12: qty 2

## 2019-08-12 MED ORDER — FENTANYL CITRATE (PF) 100 MCG/2ML IJ SOLN
INTRAMUSCULAR | Status: AC
  Administered 2019-08-12: 50 ug via INTRAVENOUS
  Filled 2019-08-12: qty 2

## 2019-08-12 MED ORDER — LIDOCAINE HCL (CARDIAC) PF 100 MG/5ML IV SOSY
PREFILLED_SYRINGE | INTRAVENOUS | Status: DC | PRN
  Administered 2019-08-12: 40 mg via INTRAVENOUS

## 2019-08-12 MED ORDER — HYDROMORPHONE HCL 1 MG/ML IJ SOLN
1.0000 mg | Freq: Once | INTRAMUSCULAR | Status: AC
  Administered 2019-08-12: 21:00:00 1 mg via INTRAVENOUS
  Filled 2019-08-12: qty 1

## 2019-08-12 MED ORDER — ACETAMINOPHEN 500 MG PO TABS: 1000.0000 mg | ORAL_TABLET | Freq: Three times a day (TID) | ORAL | 0 refills | Status: AC

## 2019-08-12 MED ORDER — FENTANYL CITRATE (PF) 100 MCG/2ML IJ SOLN: 50.0000 ug | Freq: Once | INTRAMUSCULAR | Status: AC

## 2019-08-12 MED ORDER — OXYCODONE HCL 5 MG PO TABS
10.0000 mg | ORAL_TABLET | ORAL | Status: DC | PRN
  Administered 2019-08-12: 10 mg via ORAL

## 2019-08-12 MED ORDER — ONDANSETRON HCL 4 MG PO TABS: 4.0000 mg | ORAL_TABLET | Freq: Three times a day (TID) | ORAL | 1 refills | Status: AC | PRN

## 2019-08-12 MED ORDER — ONDANSETRON HCL 4 MG/2ML IJ SOLN: 4.0000 mg | Freq: Once | INTRAMUSCULAR | Status: DC | PRN

## 2019-08-12 MED ORDER — CEFAZOLIN SODIUM-DEXTROSE 2-4 GM/100ML-% IV SOLN
2.0000 g | INTRAVENOUS | Status: AC
  Administered 2019-08-12: 2 g via INTRAVENOUS
  Filled 2019-08-12: qty 100

## 2019-08-12 MED ORDER — CHLORHEXIDINE GLUCONATE 4 % EX LIQD: 60.0000 mL | Freq: Once | CUTANEOUS | Status: DC

## 2019-08-12 MED ORDER — VANCOMYCIN HCL 1000 MG IV SOLR
INTRAVENOUS | Status: AC
  Filled 2019-08-12: qty 1000

## 2019-08-12 MED ORDER — PROPOFOL 10 MG/ML IV BOLUS
INTRAVENOUS | Status: DC | PRN
  Administered 2019-08-12: 140 mg via INTRAVENOUS
  Administered 2019-08-12: 40 mg via INTRAVENOUS

## 2019-08-12 MED ORDER — ONDANSETRON HCL 4 MG/2ML IJ SOLN
INTRAMUSCULAR | Status: DC | PRN
  Administered 2019-08-12: 4 mg via INTRAVENOUS

## 2019-08-12 MED ORDER — OXYCODONE HCL 5 MG PO TABS: ORAL_TABLET | ORAL | 0 refills | Status: AC

## 2019-08-12 MED ORDER — VANCOMYCIN HCL 1000 MG IV SOLR
INTRAVENOUS | Status: DC | PRN
  Administered 2019-08-12: 1000 mg via TOPICAL

## 2019-08-12 MED ORDER — OXYCODONE HCL 5 MG PO TABS
ORAL_TABLET | ORAL | Status: AC
  Filled 2019-08-12: qty 2

## 2019-08-12 SURGICAL SUPPLY — 54 items
BLADE SURG 15 STRL LF DISP TIS (BLADE) ×1 IMPLANT
BLADE SURG 15 STRL SS (BLADE) ×1
BLADE SURG SZ10 CARB STEEL (BLADE) ×2 IMPLANT
BNDG COHESIVE 4X5 TAN STRL (GAUZE/BANDAGES/DRESSINGS) ×2 IMPLANT
BNDG ELASTIC 4X5.8 VLCR STR LF (GAUZE/BANDAGES/DRESSINGS) ×2 IMPLANT
BNDG ESMARK 4X9 LF (GAUZE/BANDAGES/DRESSINGS) ×2 IMPLANT
BUR OVAL CARBIDE 4.0 (BURR) ×2 IMPLANT
CHLORAPREP W/TINT 26 (MISCELLANEOUS) ×2 IMPLANT
CLSR STERI-STRIP ANTIMIC 1/2X4 (GAUZE/BANDAGES/DRESSINGS) ×2 IMPLANT
COVER BACK TABLE 60X90IN (DRAPES) IMPLANT
COVER MAYO STAND REUSABLE (DRAPES) IMPLANT
COVER WAND RF STERILE (DRAPES) IMPLANT
CUFF TOURN SGL QUICK 18X4 (TOURNIQUET CUFF) IMPLANT
CUFF TOURN SGL QUICK 24 (TOURNIQUET CUFF)
CUFF TRNQT CYL 24X4X16.5-23 (TOURNIQUET CUFF) IMPLANT
DECANTER SPIKE VIAL GLASS SM (MISCELLANEOUS) IMPLANT
DRAPE EXTREMITY T 121X128X90 (DISPOSABLE) ×2 IMPLANT
DRAPE IMP U-DRAPE 54X76 (DRAPES) IMPLANT
DRAPE U-SHAPE 47X51 STRL (DRAPES) ×2 IMPLANT
DRSG PAD ABDOMINAL 8X10 ST (GAUZE/BANDAGES/DRESSINGS) ×4 IMPLANT
ELECT REM PT RETURN 15FT ADLT (MISCELLANEOUS) ×2 IMPLANT
GAUZE SPONGE 4X4 12PLY STRL (GAUZE/BANDAGES/DRESSINGS) ×2 IMPLANT
GLOVE BIO SURGEON STRL SZ 6.5 (GLOVE) ×2 IMPLANT
GLOVE BIOGEL PI IND STRL 6.5 (GLOVE) ×1 IMPLANT
GLOVE BIOGEL PI IND STRL 8 (GLOVE) ×1 IMPLANT
GLOVE BIOGEL PI INDICATOR 6.5 (GLOVE) ×1
GLOVE BIOGEL PI INDICATOR 8 (GLOVE) ×1
GLOVE ECLIPSE 8.0 STRL XLNG CF (GLOVE) ×2 IMPLANT
GOWN STRL REUS W/ TWL LRG LVL3 (GOWN DISPOSABLE) ×1 IMPLANT
GOWN STRL REUS W/TWL LRG LVL3 (GOWN DISPOSABLE) ×1
GOWN STRL REUS W/TWL XL LVL3 (GOWN DISPOSABLE) ×2 IMPLANT
KIT BASIN OR (CUSTOM PROCEDURE TRAY) ×2 IMPLANT
LOOP VESSEL MAXI BLUE (MISCELLANEOUS) IMPLANT
NS IRRIG 1000ML POUR BTL (IV SOLUTION) ×2 IMPLANT
PAD CAST 4YDX4 CTTN HI CHSV (CAST SUPPLIES) ×1 IMPLANT
PADDING CAST COTTON 4X4 STRL (CAST SUPPLIES) ×1
PENCIL SMOKE EVACUATOR (MISCELLANEOUS) ×2 IMPLANT
SLEEVE SCD COMPRESS KNEE MED (MISCELLANEOUS) IMPLANT
SLING ARM FOAM STRAP LRG (SOFTGOODS) ×2 IMPLANT
SPLINT PLASTER CAST XFAST 5X30 (CAST SUPPLIES) ×10 IMPLANT
SPLINT PLASTER XFAST SET 5X30 (CAST SUPPLIES) ×10
STOCKINETTE 4X48 STRL (DRAPES) ×2 IMPLANT
STOCKINETTE 8 INCH (MISCELLANEOUS) IMPLANT
STRIP CLOSURE SKIN 1/2X4 (GAUZE/BANDAGES/DRESSINGS) ×2 IMPLANT
SUCTION FRAZIER HANDLE 10FR (MISCELLANEOUS)
SUCTION TUBE FRAZIER 10FR DISP (MISCELLANEOUS) IMPLANT
SUT MNCRL AB 4-0 PS2 18 (SUTURE) ×4 IMPLANT
SUT VIC AB 0 CT1 27 (SUTURE) ×1
SUT VIC AB 0 CT1 27XBRD ANBCTR (SUTURE) ×1 IMPLANT
SUT VIC AB 3-0 SH 27 (SUTURE) ×1
SUT VIC AB 3-0 SH 27X BRD (SUTURE) ×1 IMPLANT
SYR BULB 3OZ (MISCELLANEOUS) ×2 IMPLANT
TOWEL OR 17X26 10 PK STRL BLUE (TOWEL DISPOSABLE) ×2 IMPLANT
YANKAUER SUCT BULB TIP NO VENT (SUCTIONS) ×2 IMPLANT

## 2019-08-12 NOTE — Anesthesia Procedure Notes (Signed)
Anesthesia Regional Block: Supraclavicular block   Pre-Anesthetic Checklist: ,, timeout performed, Correct Patient, Correct Site, Correct Laterality, Correct Procedure, Correct Position, site marked, Risks and benefits discussed,  Surgical consent,  Pre-op evaluation,  At surgeon's request and post-op pain management  Laterality: Right  Prep: chloraprep       Needles:  Injection technique: Single-shot  Needle Type: Stimulator Needle - 80          Additional Needles:   Procedures:, nerve stimulator,,,,,,,  Narrative:  Start time: 08/12/2019 8:40 AM End time: 08/12/2019 8:50 AM Injection made incrementally with aspirations every 5 mL.  Performed by: Personally   Additional Notes: 20 ml 2% lidocaine with 1:200 epi injected easily

## 2019-08-12 NOTE — Anesthesia Postprocedure Evaluation (Signed)
Anesthesia Post Note  Patient: Insurance claims handler  Procedure(s) Performed: LEFT ELBOW ULNAR NERVE DISCOMPRESSION AND TRANSPOSITION (Left )     Patient location during evaluation: PACU Anesthesia Type: General Level of consciousness: awake and alert Pain management: pain level controlled Vital Signs Assessment: post-procedure vital signs reviewed and stable Respiratory status: spontaneous breathing, nonlabored ventilation, respiratory function stable and patient connected to nasal cannula oxygen Cardiovascular status: blood pressure returned to baseline and stable Postop Assessment: no apparent nausea or vomiting Anesthetic complications: no    Last Vitals:  Vitals:   08/12/19 1300 08/12/19 1325  BP: 137/87 139/81  Pulse: 66 66  Resp: 15 12  Temp: 36.7 C 36.7 C  SpO2: 97% 100%    Last Pain:  Vitals:   08/12/19 1325  TempSrc: Oral  PainSc:                  , COKER

## 2019-08-12 NOTE — ED Provider Notes (Signed)
  Physical Exam  BP (!) 142/82   Pulse 91   Temp 97.9 F (36.6 C) (Oral)   Resp 20   SpO2 94%   Physical Exam  Gen: appears nontoxic Msk: radial pulse 2+. Good distal cap refill  ED Course/Procedures     Procedures  MDM   Patient signed out to me by Raquel James, PA-C.  Please see previous notes for further history.  In brief, patient presenting for evaluation of severe left elbow pain s/p elbow surgery today.  Upon consultation with Dr. Griffin Basil, recommends pain control, and he will follow up with the patient tomorrow by telephone.  No further imaging needs to be done at this time.  Patient is neurovascularly intact.  1 mg of Dilaudid given.  On reassessment about 30 minutes after medication, patient reports mild improvement of pain.  She is still having severe pain with any movement.  Will continue to monitor and reassess.  On reassessment at 60 minutes after medication patient reports pain is improving.  Feels she can manage her pain at home at this time.  Encourage close follow-up with Dr. Griffin Basil as needed.  At this time, patient appears safe for discharge.  Return precautions given.  Patient states she understands and agrees to plan.        Franchot Heidelberg, PA-C 08/12/19 2231    Isla Pence, MD 08/12/19 2234

## 2019-08-12 NOTE — ED Triage Notes (Signed)
Arrived POV from home with c/o left arm pain secondary tp surgery. Patient reports "it feels like my arm is about to explode". Patient states she cannot move or feel pinky finger or ring finger on left hand. Pain 10/10

## 2019-08-12 NOTE — Interval H&P Note (Signed)
We discussed procedure again. Primary goals of patient R2 deal with nerve and extension. Complex issue secondary to patient's mobilization. Patient like to proceed.

## 2019-08-12 NOTE — Transfer of Care (Signed)
Immediate Anesthesia Transfer of Care Note  Patient: Shirley Mcdonald  Procedure(s) Performed: LEFT ELBOW ULNAR NERVE DISCOMPRESSION AND TRANSPOSITION (Left )  Patient Location: PACU  Anesthesia Type:General  Level of Consciousness: drowsy, patient cooperative and responds to stimulation  Airway & Oxygen Therapy: Patient Spontanous Breathing and Patient connected to face mask oxygen  Post-op Assessment: Report given to RN and Post -op Vital signs reviewed and stable  Post vital signs: Reviewed and stable  Last Vitals:  Vitals Value Taken Time  BP 127/75 08/12/19 1201  Temp    Pulse 78 08/12/19 1206  Resp 17 08/12/19 1206  SpO2 99 % 08/12/19 1206  Vitals shown include unvalidated device data.  Last Pain:  Vitals:   08/12/19 0753  TempSrc: Oral         Complications: No apparent anesthesia complications

## 2019-08-12 NOTE — Op Note (Addendum)
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  Patient's preoperative motion was 45 to 115 degrees and she had dense ulnar nerve symptoms.  Her primary goal of surgery was treating her ulnar nerve symptoms however she was interested in increased motion.  At the end of surgery were able to achieve 15 to 125 degrees range of motion with a good stable joint.  Nerve was transposed subcutaneously anteriorly with no tension in the fascia was released 10 cm proximal to the medial epicondyle as well as 6 to 8 cm distal and the FCU fascia both deep and superficial was released.  No tension the nerve at the end of the case.  Significant osteophytosis was resected with multiple loose bodies removed.  Anterior capsulectomy was performed and there was increased motion.  In a typical patient we would have to mobilize the patient or prevented weightbearing however due to the patient's amputation this would be significant limiting to her and we were erring towards early mobility to avoid a inpatient hospitalization that the patient would like to avoid during the COVID-19 crisis.  Post-operative plan: The patient will be discharged home.  The patient will be weightbearing as tolerated for transfers.  DVT prophylaxis with aspirin secondary patient's limited mobility status.   Pain control with PRN pain medication preferring oral medicines.  Follow up plan will be scheduled in approximately 7 days for incision check and XR.  Celebrex will be used to minimize heterotopic bone in the postop setting and due to its lower risk of GI side  effects.  Post-Op Diagnosis: Same Surgeons:Primary: Bjorn Pippin, MD Assistants:Caroline McBane PA-C Location: [facility] ROOM 04 Anesthesia: General with regional anesthesia Antibiotics: Ancef 2 g with local vancomycin powder 1 g at the surgical site Tourniquet time:  Total Tourniquet Time Documented: Upper Arm (Left) - 100 minutes Total: Upper Arm (Left) - 100 minutes  Estimated Blood Loss: Minimal Complications: None Specimens: None Implants: * No implants in log *  Indications for Surgery:   Shirley Mcdonald is a 68 y.o. 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  She is very limited secondary to her limited mobility in the elbow and ulnar nerve symptoms in the left arm.  She desired increased motion but primarily her nerve symptoms to be addressed.  Benefits and risks of operative and nonoperative management were discussed prior to surgery with patient/guardian(s) and informed consent form was completed.  Specific risks including infection, need for additional surgery, periprosthetic fracture, neurovascular damage, loss of motion, nerve injury.   Procedure:   The patient was identified properly. Informed consent was obtained and the surgical site was marked. The patient was taken up to suite where general anesthesia was induced.  The patient was positioned supine on a hand table.  The left arm was prepped and draped in the usual sterile fashion.  Timeout was performed before the beginning of the case.  Tourniquet was used for the above duration.  Began with a medial approach the elbow.  We used a 10 cm incision just anterior to the medial epicondyle with the skin sharply to the hemostasis progressed.  We  identified the medial antebrachial cutaneous nerve and protected it mobilized anteriorly.  Blunt retractors used throughout the case to protect neurovascular structures.  At that point we are able to dissect with a Metzenbaum scissor and  identified the ulnar nerve proximally epicondyle.  At that point we were able to dissect it distally releasing his fascia and circumferentially releasing the nerve protecting the vascular structures posteriorly.  We did release to the Arcade of frosh and were happy with her proximal release.  We then followed it distally to the split in the FCU muscle belly.  The articular branch was sacrificed and we able to mobilize the motor branch to the FCU.  This allowed [place] to transpose the nerve anteriorly with no tension after carefully releasing the fascia of the FCU both deep and superficial.  At this point the nerve was protected and mobilized and we began our releases.  We released the posterior band of the ulnar collateral ligament identifying the joint.  Multiple osteophytes in the posterior aspect of the elbow were removed.  We used blunt retractors and elevated the triceps off the posterior aspect of the medial humerus using a Cobb elevator.  We identified the olecranon fossa and removed posterior medial osteophytes with a bur as well as an osteotome carefully protect neurovascular structures.  Once this was cleared we noted there is significant posterior lateral obstruction of motion and we felt that identifying this would be important.  We were unable to perform a adequate capsulectomy for the medial side of the joint and felt the going lateral was necessary.  Multiple loose bodies removed from the posterior medial aspect of the olecranon fossa.  We then made a linear incision over the lateral aspect of the elbow.  Went through skin sharply to be hemostasis progressed.  At that point we identified the fascia and identified the split between the anconeus and the lateral common extensors.  We elevated the musculature using the column approach as described by Dr. Cherly Hensen.  We then were able to identify the anterior capsule and placed a blunt malleable retractor across the anterior capsule of the joint.  That point  we performed a capsulotomy and capsulectomy from the anterior joint immediately allowing more motion of the joint itself.  Multiple loose bodies removed.  A coronoid osteotomy was made with a osteotome.  We then used a bur and carefully were able to clear the coronoid fossa gently removing osteophytes.  We then used the posterior pair tricipital window and were able to identify the posterior lateral aspect of the humerus as well as the olecranon.  Also osteophytes were removed and were able to clear the posterior lateral aspect of the olecranon fossa allowing increased motion.  Neurovascular structures were protected throughout the case.  Our final exam under anesthesia demonstrated a 15 degree block to extension and 125 degrees of flexion.  Supination was near full.  Pronation lacked about 10 degrees.  We did not close her anterior capsulectomy however we did close the lateral structures around the lateral ulnar collateral ligament as this was preserved throughout this case.  We then irrigated copiously on both the medial lateral incision before transposing the nerve medially and then closed both incisions with multiple absorbable sutures and Steri-Strips.  Vancomycin powder was placed in both incisions prior to closing the incisions.  A bulky dressing was placed and the patient was taken to PACU in stable condition.  Noemi Chapel, PA-C, present and scrubbed throughout the case, critical for completion in  a timely fashion, and for retraction, instrumentation, closure.

## 2019-08-12 NOTE — Anesthesia Preprocedure Evaluation (Addendum)
Anesthesia Evaluation  Patient identified by MRN, date of birth, ID band Patient awake    Reviewed: Allergy & Precautions, NPO status , Patient's Chart, lab work & pertinent test results  Airway Mallampati: II  TM Distance: >3 FB     Dental  (+) Edentulous Upper, Edentulous Lower   Pulmonary former smoker,    breath sounds clear to auscultation       Cardiovascular hypertension,  Rhythm:Regular Rate:Normal     Neuro/Psych    GI/Hepatic   Endo/Other  diabetes  Renal/GU      Musculoskeletal   Abdominal (+) + obese,   Peds  Hematology   Anesthesia Other Findings   Reproductive/Obstetrics                             Anesthesia Physical Anesthesia Plan  ASA: III  Anesthesia Plan: General   Post-op Pain Management:    Induction: Intravenous  PONV Risk Score and Plan: Ondansetron  Airway Management Planned: LMA  Additional Equipment:   Intra-op Plan:   Post-operative Plan:   Informed Consent: I have reviewed the patients History and Physical, chart, labs and discussed the procedure including the risks, benefits and alternatives for the proposed anesthesia with the patient or authorized representative who has indicated his/her understanding and acceptance.       Plan Discussed with: Anesthesiologist  Anesthesia Plan Comments:         Anesthesia Quick Evaluation

## 2019-08-12 NOTE — ED Notes (Signed)
Patient's left arm rewrapped before discharge.

## 2019-08-12 NOTE — Discharge Instructions (Addendum)
Continue taking home medications as prescribed.  Follow up with Dr. Griffin Basil as scheduled.  Return to the ER with any new, worsening, or concerning symptoms.

## 2019-08-12 NOTE — Anesthesia Procedure Notes (Signed)
Procedure Name: LMA Insertion Date/Time: 08/12/2019 9:52 AM Performed by: Glory Buff, CRNA Pre-anesthesia Checklist: Patient identified, Emergency Drugs available, Suction available and Patient being monitored Patient Re-evaluated:Patient Re-evaluated prior to induction Oxygen Delivery Method: Circle system utilized Preoxygenation: Pre-oxygenation with 100% oxygen Induction Type: IV induction LMA: LMA inserted LMA Size: 4.0 Number of attempts: 1 Placement Confirmation: positive ETCO2 and breath sounds checked- equal and bilateral Tube secured with: Tape Dental Injury: Teeth and Oropharynx as per pre-operative assessment

## 2019-08-12 NOTE — Progress Notes (Signed)
Assisted Dr. Joslin with left, ultrasound guided, interscalene  block. Side rails up, monitors on throughout procedure. See vital signs in flow sheet. Tolerated Procedure well. 

## 2019-08-12 NOTE — ED Provider Notes (Signed)
[facility]-EMERGENCY DEPT Provider Note   CSN: [id number] Arrival date & time: 08/12/19  1935     History Chief Complaint  Patient presents with  . Post-op Problem    Shirley Mcdonald is a 68 y.o. female who presents with post-op problem. She had a left elbow ulnar nerve decompression and transposition today by Dr. Everardo Pacific due to ulnar nerve neuropathy.  She states she took 2 oxycodone before she went home and then started having gradually worsening left arm pain.  She states the pain is over the posterior elbow and radiates to her fingers.  She thinks that the Ace wrap is too tight that was placed on her earlier today.  She took another dose of oxycodone around 4 PM today and she did not getting any relief therefore she decided to come to the ED.  She states that her arm feels extremely swollen.  He is also having some numbness of the pinky and part of the ring finger however states that this is a chronic issue.  Nothing makes it better.  Movement of the arm makes it worse.  She has not been using her sling at home today because she states that she is concerned about losing function of her arm.  HPI     Past Medical History:  Diagnosis Date  . Alpha (0) thalassemia (HCC)    Minor  . Alpha thalassemia minor   . Arthritis   . Asthma   . Complication of anesthesia    Combative  . Depression   . Diabetes mellitus without complication (HCC)   . Diabetes mellitus without complication (HCC)    type 2   . Dysrhythmia   . Familial Mediterranean fever (HCC)   . Headache   . History of hiatal hernia   . Hypertension   . Hypothyroidism   . Pneumonia   . PONV (postoperative nausea and vomiting) 1979  . Sleep apnea   . Sleep apnea    No CPOP    Patient Active Problem List   Diagnosis Date Noted  . Closed fracture of left proximal humerus 02/11/2019    Past Surgical History:  Procedure Laterality Date  . APPENDECTOMY    . ARTHROSCOPY KNEE W/ DRILLING    .  CESAREAN SECTION     x2  . CESAREAN SECTION  [id number]  . CHOLECYSTECTOMY    . CHOLECYSTECTOMY  1975  . CYST EXCISION Left    arm  . EXPLORATORY LAPAROTOMY  1985  . HERNIA REPAIR     x3  . HERNIA REPAIR     3 hernias  . KNEE SURGERY Left 1900  . LAPAROTOMY  1985  . LEG AMPUTATION     AKA  . LEG AMPUTATION Right 2012  . 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 REVERSE SHOULDER ARTHROPLASTY Left 02/11/2019   Procedure: REVERSE SHOULDER ARTHROPLASTY;  Surgeon: Bjorn Pippin, MD;  Location: [facility] ORS;  Service: Orthopedics;  Laterality: Left;  . THIGH FASCIOTOMY Right   . TONSILLECTOMY    . TOTAL SHOULDER ARTHROPLASTY Right 02/11/2019     OB History   No obstetric history on file.     History reviewed. No pertinent family history.  Social History   Tobacco Use  . Smoking status: Former Smoker    Packs/day: 1.00    Years: 40.00    Pack years: 40.00    Types: Cigarettes    Quit date: 1978    Years since quitting: 43.0  . Smokeless tobacco: Never  Used  . Tobacco comment: 2015  Substance Use Topics  . Alcohol use: Never  . Drug use: Never    Home Medications Prior to Admission medications   Medication Sig Start Date End Date Taking? Authorizing Provider  acetaminophen (TYLENOL) 500 MG tablet Take 2 tablets (1,000 mg total) by mouth every 8 (eight) hours for 14 days. 08/12/19 08/26/19  McBane, Maylene Roes, PA-C  albuterol (VENTOLIN HFA) 108 (90 Base) MCG/ACT inhaler Inhale 2 puffs into the lungs every 6 (six) hours as needed for wheezing or shortness of breath.    [provider]  Aspirin Buf,CaCarb-MgCarb-MgO, 81 MG TABS Take 81 mg by mouth daily.    [provider]  atorvastatin (LIPITOR) 20 MG tablet Take 20 mg by mouth every evening. 06/24/16   [provider]  buPROPion (WELLBUTRIN XL) 300 MG 24 hr tablet Take 300 mg by mouth daily. 08/17/16   [provider]  calcium-vitamin D (OSCAL WITH D) 500-200 MG-UNIT tablet Take 1 tablet by  mouth 2 (two) times a day.    [provider]  celecoxib (CELEBREX) 200 MG capsule Take 1 capsule (200 mg total) by mouth 2 (two) times daily. 08/12/19 09/11/19  Ethelda Chick, PA-C  Cholecalciferol (VITAMIN D3) 125 MCG (5000 UT) TABS Take 5,000 Units by mouth every evening.    [provider]  Coenzyme Q10 (CO Q-10 PO) Take 1 capsule by mouth daily.    [provider]  colchicine 0.6 MG tablet Take 0.6 mg by mouth 2 (two) times daily.    [provider]  fluticasone (FLONASE) 50 MCG/ACT nasal spray Place 2 sprays into both nostrils at bedtime.    [provider]  Insulin Detemir (LEVEMIR FLEXTOUCH) 100 UNIT/ML Pen Inject 5 Units into the skin 2 (two) times a day. 07/02/17   [provider]  insulin lispro (HUMALOG KWIKPEN) 100 UNIT/ML KwikPen Inject 1-10 Units into the skin 3 (three) times daily with meals. Sliding scale 08/15/16   [provider]  Javier Docker Oil 500 MG CAPS Take 500 mg by mouth daily.    [provider]  lactobacillus acidophilus (BACID) TABS tablet Take 1 tablet by mouth daily.    [provider]  lamoTRIgine (LAMICTAL) 150 MG tablet Take 150 mg by mouth at bedtime.    [provider]  levothyroxine (SYNTHROID) 50 MCG tablet Take 50 mcg by mouth daily before breakfast.    [provider]  losartan-hydrochlorothiazide (HYZAAR) 100-25 MG tablet Take 1 tablet by mouth daily. 07/23/17   [provider]  metFORMIN (GLUCOPHAGE-XR) 500 MG 24 hr tablet Take 500 mg by mouth 2 (two) times daily with a meal.  07/25/16   [provider]  Multiple Vitamin (MULTIVITAMIN WITH MINERALS) TABS tablet Take 1 tablet by mouth daily.    [provider]  omeprazole (PRILOSEC) 20 MG capsule Take 20 mg by mouth at bedtime.     [provider]  ondansetron (ZOFRAN) 4 MG tablet Take 1 tablet (4 mg total) by mouth every 8 (eight) hours as needed for up to 7 days for nausea or  vomiting. 08/12/19 08/19/19  Ethelda Chick, PA-C  oxyCODONE (OXY IR/ROXICODONE) 5 MG immediate release tablet Take 1-2 pills every 6 hrs as needed for pain, no more than 6 per day 08/12/19 08/17/19  Ethelda Chick, PA-C  pregabalin (LYRICA) 150 MG capsule Take 150 mg by mouth 2 (two) times a day. 09/04/17   [provider]  propranolol ER (INDERAL LA)  60 MG 24 hr capsule Take 60 mg by mouth at bedtime.     [provider]  traZODone (DESYREL) 50 MG tablet Take 50 mg by mouth at bedtime. 07/06/19   [provider]  UNABLE TO FIND Take 0.5 drops by mouth daily. Med Name: CBD Oil- half a drop daily     [provider]  venlafaxine XR (EFFEXOR-XR) 75 MG 24 hr capsule Take 225 mg by mouth daily with breakfast.    [provider]    Allergies    Hydromorphone hcl, Adhesive [tape], Hydrocodone-acetaminophen, Hydromorphone, Lotrel [amlodipine besy-benazepril hcl], Oxycontin [oxycodone hcl], Saccharin, Tape, Amlodipine besy-benazepril hcl, and Saccharin  Review of Systems   Review of Systems  Musculoskeletal: Positive for arthralgias.  Neurological: Positive for numbness. Negative for weakness.  All other systems reviewed and are negative.   Physical Exam Updated Vital Signs BP (!) 139/102 (BP Location: Right Arm)   Pulse 96   Temp 97.9 F (36.6 C) (Oral)   Resp (!) 22   SpO2 100%   Physical Exam Vitals and nursing note reviewed.  Constitutional:      General: She is in acute distress (mild).     Appearance: Normal appearance. She is well-developed. She is not ill-appearing.  HENT:     Head: Normocephalic and atraumatic.  Eyes:     General: No scleral icterus.       Right eye: No discharge.        Left eye: No discharge.     Conjunctiva/sclera: Conjunctivae normal.     Pupils: Pupils are equal, round, and reactive to light.  Cardiovascular:     Rate and Rhythm: Normal rate.  Pulmonary:     Effort: Pulmonary effort is normal. No  respiratory distress.  Abdominal:     General: There is no distension.  Musculoskeletal:     Cervical back: Normal range of motion.     Comments: Left upper extremity: ACE wrap was taken off. Part of the dressing was removed to look at her hand. She is able to wiggle her fingers. 2+ radial pulse  Right AKA  Skin:    General: Skin is warm and dry.  Neurological:     Mental Status: She is alert and oriented to person, place, and time.  Psychiatric:        Behavior: Behavior normal.     ED Results / Procedures / Treatments   Labs (all labs ordered are listed, but only abnormal results are displayed) Labs Reviewed - No data to display  EKG None  Radiology DG ELBOW COMPLETE LEFT (3+VIEW)  Result Date: 08/12/2019 CLINICAL DATA:  Post of evaluation. EXAM: LEFT ELBOW - COMPLETE 3+ VIEW COMPARISON:  No prior. FINDINGS: Extensive soft tissue air is noted throughout the distal arm and proximal forearm. Clinical correlation is suggested. Elbow joint effusion cannot be excluded. Diffuse severe degenerative change noted about the left elbow. Bony densities noted adjacent to the distal humerus. This could be from prior injury. No metallic foreign bodies noted. IMPRESSION: 1. Extensive soft tissue air is noted throughout the distal arm and proximal forearm. Clinical correlation suggested. Elbow joint effusion cannot be excluded. 2. Diffuse degenerative change noted about the left elbow. Bony densities noted adjacent to the distal humerus. These could be from prior injury. Electronically Signed   By: Maisie Fus  Register   On: 08/12/2019 12:57   DG Shoulder Left Port  Result Date: 08/12/2019 CLINICAL DATA:  Postoperative check. EXAM: LEFT SHOULDER COMPARISON:  02/11/2019. FINDINGS: Total  left shoulder replacement. Hardware intact. Anatomic alignment. Severe degenerative changes noted about the left shoulder. Old left anterior fourth and fifth rib fractures. Soft tissue air noted over the upper  extremity. IMPRESSION: 1. Total left shoulder replacement. Hardware intact. Anatomic alignment. 2. Soft tissue air noted over the upper extremity, this may be from prior surgery. Clinical correlation suggested. Electronically Signed   By: Maisie Fushomas  Register   On: 08/12/2019 12:58    Procedures Procedures (including critical care time)  Medications Ordered in ED Medications - No data to display  ED Course  I have reviewed the triage vital signs and the nursing notes.  Pertinent labs & imaging results that were available during my care of the patient were reviewed by me and considered in my medical decision making (see chart for details).  68 year old female with post op pain after surgery on the ulnar nerve today. Most likely combination of she hasn't been elevating it and ACE wrap may have been too tight. She is mildly hypertensive but otherwise vitals are normal. She is in mild distress due to pain. Arm was partially unwrapped to examine. She is able to wiggle all fingers and has a normal radial pulse. Dr Juleen ChinaKohut discussed with Dr. Everardo PacificVarkey who was on call tonight and he will follow up on her tomorrow. Will focus on pain control. IV Dilaudid ordered.  Care signed out to S Caccavale who will follow up on pt.  MDM Rules/Calculators/A&P  Final Clinical Impression(s) / ED Diagnoses Final diagnoses:  None    Rx / DC Orders ED Discharge Orders    None       Beryle QuantGekas,  Marie, PA-C 08/12/19 2051    Raeford RazorKohut, Stephen, MD 08/16/19 1044

## 2019-08-26 ENCOUNTER — Ambulatory Visit: Payer: Medicare Other | Admitting: Pulmonary Disease

## 2019-08-26 ENCOUNTER — Ambulatory Visit (INDEPENDENT_AMBULATORY_CARE_PROVIDER_SITE_OTHER): Payer: Medicare Other | Admitting: Pulmonary Disease

## 2019-08-26 ENCOUNTER — Encounter: Payer: Self-pay | Admitting: Pulmonary Disease

## 2019-08-26 ENCOUNTER — Other Ambulatory Visit: Payer: Self-pay

## 2019-08-26 VITALS — BP 126/74 | HR 71 | Ht 61.0 in

## 2019-08-26 DIAGNOSIS — G4733 Obstructive sleep apnea (adult) (pediatric): Secondary | ICD-10-CM

## 2019-08-26 NOTE — Progress Notes (Signed)
Subjective:    Patient ID: Shirley Mcdonald, female    DOB: 1951/05/17, 69 y.o.   MRN: [id number]  Patient with a history of obstructive sleep apnea diagnosed in 2008  In for follow-up today Has not been able to get a home sleep study performed Feeling well generally  Has been using BiPAP on a regular basis Machine is dated Continues to have mask issues  Recent health problems requiring protracted hospitalization  She does have surgery anticipated soon  [facility]  Usually goes to bed between 2 and 4 AM Takes about 15 minutes to 2 hours to fall asleep Wakes up about 3-4 times during the night Final awakening time between 8 and 10 AM  Significant weight loss of over 50 pounds recently  She feels her current BiPAP works well apart from the mask issues she is having recently  A DME company attempted to deliver BiPAP to her in February but she was hospitalized at the time They did reattempt recently but by then, she had relocated  Past Medical History:  Diagnosis Date  . Alpha (0) thalassemia (HCC)    Minor  . Alpha thalassemia minor   . Arthritis   . Asthma   . Complication of anesthesia    Combative  . Depression   . Diabetes mellitus without complication (HCC)   . Diabetes mellitus without complication (HCC)    type 2   . Dysrhythmia   . Familial Mediterranean fever (HCC)   . Headache   . History of hiatal hernia   . Hypertension   . Hypothyroidism   . Pneumonia   . PONV (postoperative nausea and vomiting) 1979  . Sleep apnea   . Sleep apnea    No CPOP   Social History   Socioeconomic History  . Marital status: Widowed    Spouse name: Not on file  . Number of children: Not on file  . Years of education: Not on file  . Highest education level: Not on file  Occupational History  . Not on file  Tobacco Use  . Smoking status: Former Smoker    Packs/day: 1.00    Years: 40.00    Pack years: 40.00    Types: Cigarettes   Quit date: 1978    Years since quitting: 43.0  . Smokeless tobacco: Never Used  . Tobacco comment: 2015  Substance and Sexual Activity  . Alcohol use: Never  . Drug use: Never  . Sexual activity: Not on file  Other Topics Concern  . Not on file  Social History Narrative   ** Merged History Encounter **       Social Determinants of Health   Financial Resource Strain:   . Difficulty of Paying Living Expenses: Not on file  Food Insecurity:   . Worried About Programme researcher, broadcasting/film/video in the Last Year: Not on file  . Ran Out of Food in the Last Year: Not on file  Transportation Needs:   . Lack of Transportation (Medical): Not on file  . Lack of Transportation (Non-Medical): Not on file  Physical Activity:   . Days of Exercise per Week: Not on file  . Minutes of Exercise per Session: Not on file  Stress:   . Feeling of Stress : Not on file  Social Connections:   . Frequency of Communication with Friends and Family: Not on file  . Frequency of Social Gatherings with Friends and Family: Not on file  .  Attends Religious Services: Not on file  . Active Member of Clubs or Organizations: Not on file  . Attends Archivist Meetings: Not on file  . Marital Status: Not on file  Intimate Partner Violence:   . Fear of Current or Ex-Partner: Not on file  . Emotionally Abused: Not on file  . Physically Abused: Not on file  . Sexually Abused: Not on file   History reviewed. No pertinent family history.  Review of Systems  Constitutional: Negative.   HENT: Negative.   Eyes: Negative.   Respiratory: Positive for apnea. Negative for cough and shortness of breath.   Cardiovascular: Negative.   Musculoskeletal: Positive for arthralgias.  Neurological: Positive for headaches.  Psychiatric/Behavioral: Positive for dysphoric mood and sleep disturbance.       Objective:   Physical Exam Constitutional:      Appearance: She is obese.  HENT:     Head: Normocephalic and atraumatic.      Nose: No congestion.     Mouth/Throat:     Mouth: Mucous membranes are moist.  Eyes:     General:        Right eye: No discharge.        Left eye: No discharge.     Extraocular Movements: Extraocular movements intact.     Pupils: Pupils are equal, round, and reactive to light.  Cardiovascular:     Rate and Rhythm: Normal rate and regular rhythm.     Pulses: Normal pulses.     Heart sounds: Normal heart sounds. No murmur.  Pulmonary:     Effort: Pulmonary effort is normal. No respiratory distress.     Breath sounds: Normal breath sounds. No stridor. No wheezing, rhonchi or rales.  Musculoskeletal:     Cervical back: Normal range of motion. No rigidity.  Neurological:     Mental Status: She is alert.    Vitals:   08/26/19 1531  BP: 126/74  Pulse: 71  SpO2: 97%   Results of the Epworth flowsheet 06/09/2019  Sitting and reading 2  Watching TV 2  Sitting, inactive in a public place (e.g. a theatre or a meeting) 0  As a passenger in a car for an hour without a break 1  Lying down to rest in the afternoon when circumstances permit 1  Sitting and talking to someone 1  Sitting quietly after a lunch without alcohol 1  In a car, while stopped for a few minutes in traffic 0  Total score 8      Assessment & Plan:  .  Obstructive sleep apnea -Schedule patient for a home sleep study  She currently is on BiPAP and compliant -She requires a different mask  Continue lines of care  Plan:  We will schedule for home sleep study Contact DME company regarding set up for BiPAP once results are available

## 2019-08-26 NOTE — Patient Instructions (Signed)
History of obstructive sleep apnea  Schedule you for home sleep study  get you on BiPAP-new machine and mask as soon as possible

## 2019-08-27 ENCOUNTER — Ambulatory Visit: Payer: Medicare Other | Admitting: Pulmonary Disease

## 2019-10-08 ENCOUNTER — Ambulatory Visit: Payer: Medicare Other | Admitting: Physical Therapy

## 2019-10-16 ENCOUNTER — Ambulatory Visit: Payer: Medicare Other

## 2019-10-16 ENCOUNTER — Other Ambulatory Visit: Payer: Self-pay

## 2019-10-16 DIAGNOSIS — G4733 Obstructive sleep apnea (adult) (pediatric): Secondary | ICD-10-CM

## 2019-10-17 DIAGNOSIS — G4733 Obstructive sleep apnea (adult) (pediatric): Secondary | ICD-10-CM

## 2019-10-27 ENCOUNTER — Telehealth: Payer: Self-pay | Admitting: Pulmonary Disease

## 2019-10-27 DIAGNOSIS — G4733 Obstructive sleep apnea (adult) (pediatric): Secondary | ICD-10-CM

## 2019-10-27 NOTE — Telephone Encounter (Signed)
Dr. [name] has reviewed the home sleep test this showed mild sleep apnea.   Recommendations   Patient is currently on bipap therapy, patient needs to continue with bipap therapy.  Weight loss measures .   Advise against driving while sleepy & against medication with sedative side effects.   DME order for bipap settings placed with [employer]. ATC Patient. LM for patient to call back for sleep results and recommendations.

## 2019-10-27 NOTE — Telephone Encounter (Signed)
Called and spoke with Patient. Dr.Olalere's results and recommendations given.  Understanding stated.  Patient is needing a new bipap with extra small mask.  DME order placed.  Nothing further at this time.

## 2019-10-27 NOTE — Telephone Encounter (Signed)
Pt called back. Please return call.  

## 2019-10-28 ENCOUNTER — Telehealth: Payer: Self-pay | Admitting: Pulmonary Disease

## 2019-10-28 NOTE — Telephone Encounter (Signed)
ATC Patient.  Left message for Patient to return call to clarify bipap DME.

## 2019-10-28 NOTE — Telephone Encounter (Signed)
Per [facility], they are not the pt's DMe for her bipap.  This is the communication listed below:  Henderson Newcomer sent to Elwyn Lade, Karle Barr, [facility]!  I went back and looked through my In Basket messages from 07/01/19 and found this communication:   Johnney Killian to Matthew Folks, Burnett Kanaris, [place]; [place], Georgetta Haber, she is not a current patient with [facility]. It appears this is for a replacement BIPAP. If this is the case, she will need to have all new studies to re-qualify the DX (baseline and titration showing failed CPAP) in order for [place] to provide the equipment and bill her insurance.  Please advise the Dr.  Danae Chen you  [facility] this helps! ;)  Melissa       Previous Messages   ----- Message -----  From: Arlee Muslim  Sent: 10/28/2019  9:15 AM EST  To: Henderson Newcomer, Doris Cheadle  Subject: RE: bipap settings                I have checked...she is not in our [employer] system. I searched by name and DOB and just by DOB as well as in our Resupp[employer] system.  We have never provided this patient with any equipment or supplies.   ----- Message -----  From: Lanna Poche D  Sent: 10/28/2019  8:59 AM EST  To: Henderson Newcomer, Arlee Muslim  Subject: RE: bipap settings              Could you please double check this one as we are showing you are her DME provider for her BiPap since June 30, 2019.    Thank you,  Jeanice Lim  ----- Message -----  From: Arlee Muslim  Sent: 10/28/2019  8:54 AM EST  To: Henderson Newcomer, Doris Cheadle  Subject: RE: bipap settings                [place], she is not a patient with [facility]. Based on the notes, she is with another company. The only study that is in Epic is a home sleep study, which would not qualify her for a BIPAP.  This needs to be sent to her current DME provider.  Thank you

## 2019-10-29 NOTE — Telephone Encounter (Signed)
lmtcb for pt.  

## 2019-10-30 NOTE — Telephone Encounter (Signed)
ATC pt, no answer. Left message for pt to call back.  

## 2019-11-02 ENCOUNTER — Telehealth: Payer: Self-pay | Admitting: Pulmonary Disease

## 2019-11-02 NOTE — Telephone Encounter (Signed)
We have attempted to contact pt several times with no success or call back from pt. Per triage protocol, message will be closed.  

## 2019-11-02 NOTE — Telephone Encounter (Signed)
Left message for patient to call back  

## 2019-11-03 NOTE — Telephone Encounter (Signed)
LMTCB x2 for pt 

## 2019-11-04 NOTE — Telephone Encounter (Signed)
Pt calling back regarding BiPap.  Needing local durable medical equipment company.  Pt lives in [place].  Please advise. [phone]

## 2019-11-04 NOTE — Telephone Encounter (Signed)
Left message for patient to call back  

## 2019-11-09 ENCOUNTER — Telehealth: Payer: Self-pay | Admitting: Pulmonary Disease

## 2019-11-09 DIAGNOSIS — G4733 Obstructive sleep apnea (adult) (pediatric): Secondary | ICD-10-CM

## 2019-11-09 NOTE — Telephone Encounter (Signed)
Called and spoke with Patient. Patient stated she was getting supplies from [facility], from [place], but they do not deliver here. Patient stated she now lives  in [place]. Patient stated DME is [facility] for equipment.  Message routed to [facility]

## 2019-11-09 NOTE — Telephone Encounter (Signed)
Pt called back- please return call [phone]

## 2019-11-10 NOTE — Telephone Encounter (Signed)
Order has been placed.

## 2019-11-10 NOTE — Telephone Encounter (Signed)
This will require a new DME order to be placed, please.

## 2019-11-11 ENCOUNTER — Telehealth: Payer: Self-pay | Admitting: Pulmonary Disease

## 2019-11-11 DIAGNOSIS — G4733 Obstructive sleep apnea (adult) (pediatric): Secondary | ICD-10-CM

## 2019-11-11 NOTE — Telephone Encounter (Signed)
Response from [facility] for DME order for cpap supplies:  Shirley Mcdonald sent to [name]  Patient cancelled order. Patient is wanting a bipap machine not just supplies. We would need hst, cpap titration and bipap titration along with chart notes to process a bipap order for patient.

## 2019-11-12 ENCOUNTER — Other Ambulatory Visit: Payer: Self-pay

## 2019-11-12 ENCOUNTER — Ambulatory Visit: Payer: Medicare Other | Attending: Family Medicine | Admitting: Physical Therapy

## 2019-11-12 DIAGNOSIS — M6281 Muscle weakness (generalized): Secondary | ICD-10-CM

## 2019-11-12 DIAGNOSIS — R2689 Other abnormalities of gait and mobility: Secondary | ICD-10-CM | POA: Diagnosis not present

## 2019-11-12 DIAGNOSIS — R2681 Unsteadiness on feet: Secondary | ICD-10-CM | POA: Diagnosis present

## 2019-11-12 NOTE — Telephone Encounter (Signed)
I called and spoke with patient and she states that she is on BIPAP not CPAP. Patient has moved to [place] and needs a DME company. I think that she wants to establish with [facility].   Per [facility]: Patient is wanting a bipap machine not just supplies. We would need hst, cpap titration and bipap titration along with chart notes to process a bipap order for patient.   Dr. [name] please advise on how you would like to go about getting this patient what she needs.

## 2019-11-13 ENCOUNTER — Encounter: Payer: Self-pay | Admitting: Physical Therapy

## 2019-11-13 NOTE — Therapy (Signed)
[facility] [address] [place], [place], [address] Phone: [phone]   Fax:  [phone]  Physical Therapy Evaluation  Patient Details  Name: Shirley Mcdonald MRN: [id number] Date of Birth: 09-25-1950 Referring Provider (PT): Dr. Anastasia Pall   Encounter Date: 11/12/2019  PT End of Session - 11/13/19 1421    Visit Number  1    Authorization Type  Medicare    PT Start Time  1430    PT Stop Time  1545    PT Time Calculation (min)  75 min    Activity Tolerance  Patient tolerated treatment well    Behavior During Therapy  [facility] for tasks assessed/performed       Past Medical History:  Diagnosis Date  . Alpha (0) thalassemia (HCC)    Minor  . Alpha thalassemia minor   . Arthritis   . Asthma   . Complication of anesthesia    Combative  . Depression   . Diabetes mellitus without complication ([place])   . Diabetes mellitus without complication ([place])    type 2   . Dysrhythmia   . 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 Headache   . History of hiatal hernia   . Hypertension   . Hypothyroidism   . Pneumonia   . PONV (postoperative nausea and vomiting) 1979  . Sleep apnea   . Sleep apnea    No CPOP    Past Surgical History:  Procedure Laterality Date  . APPENDECTOMY    . ARTHROSCOPY KNEE W/ DRILLING    . CESAREAN SECTION     x2  . CESAREAN SECTION  [id number]  . CHOLECYSTECTOMY    . CHOLECYSTECTOMY  1975  . CYST EXCISION Left    arm  . EXPLORATORY LAPAROTOMY  1985  . HERNIA REPAIR     x3  . HERNIA REPAIR     3 hernias  . KNEE SURGERY Left 1900  . LAPAROTOMY  1985  . LEG AMPUTATION     AKA  . LEG AMPUTATION Right 2012  . 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 REVERSE SHOULDER ARTHROPLASTY Left 02/11/2019   Procedure: REVERSE SHOULDER ARTHROPLASTY;  Surgeon: Hiram Gash, MD;  Location: [facility];  Service: Orthopedics;  Laterality: Left;  . THIGH FASCIOTOMY Right   . TONSILLECTOMY    . TOTAL  SHOULDER ARTHROPLASTY Right 02/11/2019  . ULNAR NERVE TRANSPOSITION Left 08/12/2019   Procedure: LEFT ELBOW ULNAR NERVE DISCOMPRESSION AND TRANSPOSITION;  Surgeon: Hiram Gash, MD;  Location: [facility];  Service: Orthopedics;  Laterality: Left;    There were no vitals filed for this visit.   Subjective Assessment - 11/13/19 1412    Subjective  Pt presents for w/c evaluation - vendor Deberah Pelton, [employer] with [employer] present for eval    Pertinent History  RLE transfemoral amputation, s/p Lt reverse shoulder arthroplasty; Rt total shoulder athroplasty; Amyloidosis    Patient Stated Goals  obtain power wheelchair    Currently in Pain?  Other (Comment)   pt reports generalized pain due to OA        [facility] PT Assessment - 11/13/19 0001      Assessment   Medical Diagnosis  s/p Rt transfemoral amputation; s/p Rt total shoulder arthroplasty 02-11-19    Referring Provider (PT)  Dr. Anastasia Pall    Onset Date/Surgical Date  --   2012;  Referral date 06-15-19   Hand Dominance  Right      Precautions   Precautions  Fall      Restrictions   Weight Bearing Restrictions  No      Balance Screen   Has the patient fallen in the past 6 months  Yes    How many times?  3-4    Has the patient had a decrease in activity level because of a fear of falling?   No    Is the patient reluctant to leave their home because of a fear of falling?   No      Home Environment   Living Environment  Other (Comment)    Additional Comments  retirement community      Prior Function   Level of Independence  Independent with basic ADLs;Independent with household mobility with device;Independent with transfers                Objective measurements completed on examination: See above findings.              PT Education - 11/13/19 1420    Education Details  results of w/c eval    Person(s) Educated  Patient    Methods  Explanation    Comprehension  Verbalized understanding                   Plan - 11/13/19 1428    Clinical Impression Statement  Pt evaluated for power wheelchair (Group 2 with rehab seat for pressure relief) with vendor Harrie Jeans, [employer] with [employer]; recommended [employer] power wheelchair    Personal Factors and Comorbidities  Fitness;Comorbidity 2;Past/Current Experience;Time since onset of injury/illness/exacerbation;Transportation    Examination-Activity Limitations  Locomotion Level;Stand;Transfers;Hygiene/Grooming;Bend    Examination-Participation Restrictions  Cleaning;Meal Prep;Shop;Community Activity    Stability/Clinical Decision Making  Stable/Uncomplicated    Clinical Decision Making  Low    PT Frequency  One time visit    PT Treatment/Interventions  Other (comment)   wheelchair management   Recommended Other Services  power w/c to be obtained from [employer] - [employer] power w/c    Consulted and Agree with Plan of Care  Patient       Patient will benefit from skilled therapeutic intervention in order to improve the following deficits and impairments:  Decreased balance, Decreased strength, Pain, Difficulty walking, Decreased range of motion  Visit Diagnosis: Other abnormalities of gait and mobility - Plan: PT plan of care cert/re-cert  Muscle weakness (generalized) - Plan: PT plan of care cert/re-cert  Unsteadiness on feet - Plan: PT plan of care cert/re-cert     Problem List Patient Active Problem List   Diagnosis Date Noted  . Closed fracture of left proximal humerus 02/11/2019    , Donavan Burnet, PT 11/13/2019, 2:37 PM  [facility] [address] [place], [place], [address] Phone: [phone]   Fax:  [phone]  Name: Shirley Mcdonald MRN: [id number] Date of Birth: 1951-06-24

## 2019-11-16 NOTE — Telephone Encounter (Signed)
Pt called back. Please return call.  

## 2019-11-16 NOTE — Telephone Encounter (Signed)
LMTCB for the pt 

## 2019-11-16 NOTE — Telephone Encounter (Signed)
That patient be aware of what we need to do and if agreeable  can schedule a home sleep study with a subsequent CPAP/BiPAP titration study in the LAB

## 2019-11-16 NOTE — Telephone Encounter (Signed)
BiPAP: Max IPAP= 25,  Min EPAP 4, Pressure support= 4

## 2019-11-16 NOTE — Telephone Encounter (Signed)
Per [facility] patient would need to have a HST and CPAP/BIPAP titration study done. Dr. [name] states that the patient is aware of what testing she needs. She had a home sleep study done in February 2021. I will place an order for her to have the inlab titration. I left patient a voicemail to return call about this.

## 2019-11-16 NOTE — Telephone Encounter (Signed)
Mild Obstructive Sleep Apnea. Mild oxygen desaturations  Recommend BIPAP therapy for mild obstructive sleep apnea, patient currently on BIPAP.  BIPAP auto, low pressure setting of 4, high pressure setting of 25 with pressure support minimum of 4, maximum pressure support setting of 8.  Close clinical follow up with compliance monitoring to optimize therapeutic efficiency.  Weight loss measures encouraged He should be cautioned against driving when sleepy and against medications with sedative side effects.  Routing to Dr. [name] to get clarification on BIPAP settings.

## 2019-11-17 NOTE — Telephone Encounter (Signed)
ATC patient Left message about patient needing to get titration test done and let her know that the order has been placed.   Nothing further needed at this time.

## 2019-11-17 NOTE — Telephone Encounter (Signed)
ATC pt, no answer. Left message for pt to call back.  

## 2019-12-08 ENCOUNTER — Other Ambulatory Visit (HOSPITAL_COMMUNITY)
Admission: RE | Admit: 2019-12-08 | Discharge: 2019-12-08 | Disposition: A | Discharge status: Home / Self Care | Payer: Medicare Other | Source: Ambulatory Visit | Attending: Pulmonary Disease | Admitting: Pulmonary Disease

## 2019-12-08 DIAGNOSIS — Z01812 Encounter for preprocedural laboratory examination: Secondary | ICD-10-CM | POA: Diagnosis present

## 2019-12-08 DIAGNOSIS — Z20822 Contact with and (suspected) exposure to covid-19: Secondary | ICD-10-CM | POA: Diagnosis not present

## 2019-12-08 LAB — SARS CORONAVIRUS 2 (TAT 6-24 HRS): SARS Coronavirus 2: NEGATIVE

## 2019-12-10 ENCOUNTER — Ambulatory Visit (HOSPITAL_BASED_OUTPATIENT_CLINIC_OR_DEPARTMENT_OTHER): Payer: Medicare Other | Attending: Pulmonary Disease | Admitting: Pulmonary Disease

## 2019-12-10 ENCOUNTER — Other Ambulatory Visit: Payer: Self-pay

## 2019-12-10 ENCOUNTER — Encounter: Payer: Self-pay | Admitting: Pulmonary Disease

## 2019-12-10 DIAGNOSIS — Z79899 Other long term (current) drug therapy: Secondary | ICD-10-CM | POA: Insufficient documentation

## 2019-12-10 DIAGNOSIS — G4733 Obstructive sleep apnea (adult) (pediatric): Secondary | ICD-10-CM | POA: Insufficient documentation

## 2019-12-20 ENCOUNTER — Telehealth: Payer: Self-pay | Admitting: Pulmonary Disease

## 2019-12-20 DIAGNOSIS — G4733 Obstructive sleep apnea (adult) (pediatric): Secondary | ICD-10-CM

## 2019-12-20 NOTE — Telephone Encounter (Signed)
Sleep study result  Date of study: 12/10/2019  Impression: mild obstructive sleep apnea  Recommendation: Trial of BiPAP therapy on 15/11 cm H2O with a X-Small size Resmed Full Face Mask AirFit F10 for Her mask and heated humidification.  Office follow-up about 4 to 6 weeks post initiation of BiPAP therapy

## 2019-12-20 NOTE — Procedures (Signed)
POLYSOMNOGRAPHY  Last, First: Shirley, Mcdonald MRN: [id number] Gender: Female Age (years): 69 Weight (lbs): 200 DOB: 11/08/1950 BMI: 37 Primary Care: No PCP Epworth Score: 7 Referring: Tomma Lightning MD Technician: Armen Pickup Interpreting: Tomma Lightning MD Study Type: BiPAP Ordered Study Type: BiPAP Study date: 12/10/2019 Location: [facility] CLINICAL INFORMATION Shirley Mcdonald is a 69 year old Female and was referred to the sleep center for evaluation of G47.30 Sleep Apnea, Unspecified (780.57). Indications include OSA.   Most recent polysomnogram dated 10/17/2019 revealed an AHI of 11.8/h. MEDICATIONS Patient self administered medications include: CALCIUM, LYRICA, OMIPROZOLE, TRAZADONE, COLCHICINE. Medications administered during study include Sleep medicine administered - TRAZADONE 50 MG at 10:00:02 PM  SLEEP STUDY TECHNIQUE The patient underwent an attended overnight polysomnography titration to assess the effects of BIPAP therapy. The following variables were monitored: EEG(C4-A1, C3-A2, O1-A2, O2-A1), EOG, submental and leg EMG, ECG, oxyhemoglobin saturation by pulse oximetry, thoracic and abdominal respiratory effort belts, nasal/oral airflow by pressure sensor, body position sensor and snoring sensor. BIPAP pressure was titrated to eliminate apneas, hypopneas and oxygen desaturation. Hypopneas were scored per AASM definition IB (4% desaturation)  TECHNICIAN COMMENTS Comments added by Technician: NO RESTROOM VISTED. Patient was restless all through the night. Comments added by Scorer: N/A SLEEP ARCHITECTURE The study was initiated at 10:04:49 PM and terminated at 4:49:21 AM. Total recorded time was 404.5 minutes. EEG confirmed total sleep time was 394.5 minutes yielding a sleep efficiency of 97.5%%. Sleep onset after lights out was 5.0 minutes with a REM latency of 7.0 minutes. The patient spent 1.4%% of the night in stage N1 sleep, 46.3%% in stage N2 sleep, 9.5%% in stage  N3 and 42.8% in REM. The Arousal Index was 10.6/hour. RESPIRATORY PARAMETERS The overall AHI was 9.3 per hour, and the RDI was 9.7 events/hour with a central apnea index of 2.0per hour. The most appropriate setting of BiPAP was 15/11 cm H2O. At this setting, the sleep efficiency was 100 % and the patient was supine for 0%. The AHI was 0.0 events per hour, and the RDI was 0.0 events/hour (with 2.0 central events) and the arousal index was 0.0 per hour.The oxygen nadir was 91.0% during sleep.  The cumulative time under 88% oxygen saturation was 5.5 minutes  LEG MOVEMENT DATA The total leg movements were 261 with a resulting leg movement index of 39.7. Associated arousal with leg movement index was 1.5. CARDIAC DATA The underlying cardiac rhythm was most consistent with sinus rhythm. Mean heart rate during sleep was 80.9 bpm. Additional rhythm abnormalities include None.   IMPRESSIONS Mild Obstructive Sleep apnea(OSA) Optimal pressure attained. EKG showed no cardiac abnormalities. Moderate Oxygen Desaturation No snoring was audible during this study. No significant periodic leg movements(PLMs) during sleep. However, no significant associated arousals.   DIAGNOSIS Obstructive Sleep Apnea (327.23 [G47.33 ICD-10])   RECOMMENDATIONS Trial of BiPAP therapy on 15/11 cm H2O with a X-Small size Resmed Full Face Mask AirFit F10 for Her mask and heated humidification. Avoid alcohol, sedatives and other CNS depressants that may worsen sleep apnea and disrupt normal sleep architecture. Sleep hygiene should be reviewed to assess factors that may improve sleep quality. Weight management and regular exercise should be initiated or continued. Return to Sleep Center for re-evaluation after 4 weeks of therapy  [Electronically signed] 12/20/2019 06:06 AM  Virl Diamond MD NPI: [id number]

## 2019-12-21 NOTE — Telephone Encounter (Signed)
Patient is returning phone call. Patient phone number is [phone].

## 2019-12-21 NOTE — Telephone Encounter (Signed)
Called pt and advised message from the provider. Pt understood and verbalized understanding. Nothing further is needed.   Bipap ordered.  

## 2019-12-21 NOTE — Telephone Encounter (Signed)
ATC pt, no answer. Left message for pt to call back.  

## 2019-12-22 ENCOUNTER — Telehealth: Payer: Self-pay | Admitting: Pulmonary Disease

## 2019-12-22 NOTE — Telephone Encounter (Signed)
[employer] is requesting cpap/bipap titration for insurance purposes.  I see in chart where pt has procedure, but it states that the procedure is still in process and no results are available to print and send.    Dr. [name] please advise if you have the preliminary cpap titration results still to be read.  Thanks!

## 2019-12-23 ENCOUNTER — Telehealth: Payer: Self-pay | Admitting: Pulmonary Disease

## 2019-12-23 DIAGNOSIS — G4733 Obstructive sleep apnea (adult) (pediatric): Secondary | ICD-10-CM

## 2019-12-23 NOTE — Telephone Encounter (Signed)
Can be found under encounters  Sleep study on 22nd

## 2019-12-23 NOTE — Telephone Encounter (Signed)
Sleep study located and printed. Called and spoke to [place] at [facility], asked to have sleep study faxed to [phone]. This has been faxed as requested.  Nothing further needed at this time- will close encounter.

## 2019-12-23 NOTE — Telephone Encounter (Signed)
Patient has been on BiPAP for over 9 years. I realize rules are rules but does not make sense in this situation--is there any way around having her go back to the sleep lab and wasting a ton of money and time, and a test that's not really going to help the patient and just set her up with BIPAP.  Just asking ??

## 2019-12-23 NOTE — Telephone Encounter (Signed)
Called and spoke with Gilda from [facility] who stated they needed pt's cpap titration. Stated to her that we only have records of bipap titration that was done. Stated to her we would discuss this with AO and she verbalized understanding. Since this is a duplicate encounter to one that is also open, closing this encounter.

## 2019-12-23 NOTE — Telephone Encounter (Signed)
Thanks.  Let us let ms Beharry know that we can't help it but to send her back to the lab.  Someone still needs to pay for machine and if the payor says that's what we have to do, we don't have a choice.  I am okay with ordering the study

## 2019-12-23 NOTE — Telephone Encounter (Signed)
Patient is returning phone call. Patient phone number is [phone]. 

## 2019-12-23 NOTE — Telephone Encounter (Signed)
This is all coming from insurance, they will not cover the BiPAP for the pt unless she has a CPAP titration.

## 2019-12-23 NOTE — Telephone Encounter (Signed)
Spoke with the pt and notified of recs per Dr [name]  She verbalized understanding  CPAP titration ordered

## 2019-12-23 NOTE — Telephone Encounter (Signed)
Called and spoke with pt to see if she had ever tried CPAP prior to being placed on BIPAP or if she was immediately just placed on BIPAP. Pt stated she was started on cpap in 2008 by Dr. Beverley Fiedler, but then 2013-2014 pt was told she needed to be placed on BIPAP.  Called and spoke with Rod Holler from [facility] stating to her the info above. She stated that when she had called the office to have cpap titration faxed to her, bipap titration was faxed and not cpap titration. She stated that pt is going to need to have a cpap titration done to show that pt has tried/failed a cpap and then to have bipap titration done as this is what is required for insurance. There is only report of pt having a bipap titration done.   Rod Holler stated she called 3/24, she had stated that pt would need to have a HST (which was done), CPAP titration and then a BIPAP titration if pt could not tolerate the CPAP during that titration study. When pt had the BIPAP titration performed 12/10/19, pt was immediately placed on BIPAP and never did a trial of the CPAP for titration.  Dr. Val Eagle, please advise on this.

## 2019-12-23 NOTE — Telephone Encounter (Signed)
LMTCB x1 for pt.  

## 2019-12-24 ENCOUNTER — Telehealth: Payer: Self-pay | Admitting: Pulmonary Disease

## 2019-12-24 NOTE — Telephone Encounter (Signed)
Called and spoke with patient she said that she was returning a call to [facility] or [facility]. Asked PCC's if they reached out and it was [place]. Message will be routed to her to follow up

## 2019-12-25 ENCOUNTER — Telehealth: Payer: Self-pay | Admitting: Pulmonary Disease

## 2019-12-25 NOTE — Telephone Encounter (Signed)
Returned pt's call.  Had to leave vm for her to call me back.  I had called pt to give her appt info for covid test and cpap titration study.

## 2019-12-25 NOTE — Telephone Encounter (Signed)
Spoke to pt and gave her appt info for covid test and cpap titration study.  Nothing further needed.

## 2019-12-25 NOTE — Telephone Encounter (Signed)
Pt is not scheduled to have the CPAP titration done on 01/06/20 so that she can get the bipap approved

## 2019-12-25 NOTE — Telephone Encounter (Signed)
Response from [employer] for bipap order:  Printed. Do you happen to know where I can locate the CPAP titration or chart notes stating that patient tried CPAP and failed. The titration was just a BIPAP titration they did not start the patient on a CPAP.   I am not able to locate what they have requested.  Will you please help with this one?

## 2019-12-25 NOTE — Telephone Encounter (Signed)
Just figured that one out myself.  Thank you, Verlon Au.  Nothing further needed at this time.

## 2019-12-29 ENCOUNTER — Telehealth: Payer: Self-pay | Admitting: Pulmonary Disease

## 2019-12-29 NOTE — Telephone Encounter (Signed)
Called and spoke with patient. She states that she went to her old pulmonary doctor on Friday and signed a medical release form for her records to be sent to our office for Dr. [name]. Informed patient that we would keep an eye out for these records and let her know once we have received them. Patient expressed understanding. Routing to [employer] for follow up

## 2019-12-31 NOTE — Telephone Encounter (Signed)
Spoke with AO nurse still nothing has been received.

## 2020-01-04 ENCOUNTER — Other Ambulatory Visit (HOSPITAL_COMMUNITY): Payer: Medicare Other

## 2020-01-04 ENCOUNTER — Telehealth: Payer: Self-pay | Admitting: Pulmonary Disease

## 2020-01-04 NOTE — Telephone Encounter (Signed)
Patient following up on if records have been received.  Please advise.  [phone].

## 2020-01-04 NOTE — Telephone Encounter (Signed)
Called pt and transferred her over to Terri in the sleep lab

## 2020-01-05 ENCOUNTER — Other Ambulatory Visit: Payer: Self-pay

## 2020-01-05 ENCOUNTER — Emergency Department (HOSPITAL_BASED_OUTPATIENT_CLINIC_OR_DEPARTMENT_OTHER): Payer: Medicare Other

## 2020-01-05 ENCOUNTER — Encounter (HOSPITAL_BASED_OUTPATIENT_CLINIC_OR_DEPARTMENT_OTHER): Payer: Self-pay | Admitting: Emergency Medicine

## 2020-01-05 ENCOUNTER — Emergency Department (HOSPITAL_BASED_OUTPATIENT_CLINIC_OR_DEPARTMENT_OTHER)
Admission: EM | Admit: 2020-01-05 | Discharge: 2020-01-05 | Disposition: A | Discharge status: Home / Self Care | Payer: Medicare Other | Attending: Emergency Medicine | Admitting: Emergency Medicine

## 2020-01-05 DIAGNOSIS — Z87891 Personal history of nicotine dependence: Secondary | ICD-10-CM | POA: Diagnosis not present

## 2020-01-05 DIAGNOSIS — R110 Nausea: Secondary | ICD-10-CM | POA: Insufficient documentation

## 2020-01-05 DIAGNOSIS — E039 Hypothyroidism, unspecified: Secondary | ICD-10-CM | POA: Insufficient documentation

## 2020-01-05 DIAGNOSIS — Z794 Long term (current) use of insulin: Secondary | ICD-10-CM | POA: Insufficient documentation

## 2020-01-05 DIAGNOSIS — I10 Essential (primary) hypertension: Secondary | ICD-10-CM | POA: Insufficient documentation

## 2020-01-05 DIAGNOSIS — Z79899 Other long term (current) drug therapy: Secondary | ICD-10-CM | POA: Diagnosis not present

## 2020-01-05 DIAGNOSIS — R109 Unspecified abdominal pain: Secondary | ICD-10-CM | POA: Diagnosis present

## 2020-01-05 DIAGNOSIS — E119 Type 2 diabetes mellitus without complications: Secondary | ICD-10-CM | POA: Diagnosis not present

## 2020-01-05 LAB — COMPREHENSIVE METABOLIC PANEL
ALT: 28 U/L (ref 0–44)
AST: 30 U/L (ref 15–41)
Albumin: 4.1 g/dL (ref 3.5–5.0)
Alkaline Phosphatase: 35 U/L — ABNORMAL LOW (ref 38–126)
Anion gap: 9 (ref 5–15)
BUN: 6 mg/dL — ABNORMAL LOW (ref 8–23)
CO2: 27 mmol/L (ref 22–32)
Calcium: 9 mg/dL (ref 8.9–10.3)
Chloride: 101 mmol/L (ref 98–111)
Creatinine, Ser: 0.42 mg/dL — ABNORMAL LOW (ref 0.44–1.00)
GFR calc Af Amer: >60 mL/min (ref >60)
GFR calc non Af Amer: >60 mL/min (ref >60)
Glucose, Bld: 134 mg/dL — ABNORMAL HIGH (ref 70–99)
Potassium: 3.4 mmol/L — ABNORMAL LOW (ref 3.5–5.1)
Sodium: 137 mmol/L (ref 135–145)
Total Bilirubin: 1.2 mg/dL (ref 0.3–1.2)
Total Protein: 6.6 g/dL (ref 6.5–8.1)

## 2020-01-05 LAB — CBC WITH DIFFERENTIAL/PLATELET
Abs Immature Granulocytes: 0.01 10*3/uL (ref 0.00–0.07)
Basophils Absolute: 0 10*3/uL (ref 0.0–0.1)
Basophils Relative: 1 %
Eosinophils Absolute: 0.2 10*3/uL (ref 0.0–0.5)
Eosinophils Relative: 7 %
HCT: 29.7 % — ABNORMAL LOW (ref 36.0–46.0)
Hemoglobin: 9.9 g/dL — ABNORMAL LOW (ref 12.0–15.0)
Immature Granulocytes: 0 %
Lymphocytes Relative: 23 %
Lymphs Abs: 0.8 10*3/uL (ref 0.7–4.0)
MCH: 25.7 pg — ABNORMAL LOW (ref 26.0–34.0)
MCHC: 33.3 g/dL (ref 30.0–36.0)
MCV: 77.1 fL — ABNORMAL LOW (ref 80.0–100.0)
Monocytes Absolute: 0.3 10*3/uL (ref 0.1–1.0)
Monocytes Relative: 9 %
Neutro Abs: 2.1 10*3/uL (ref 1.7–7.7)
Neutrophils Relative %: 60 %
Platelets: 138 10*3/uL — ABNORMAL LOW (ref 150–400)
RBC: 3.85 MIL/uL — ABNORMAL LOW (ref 3.87–5.11)
RDW: 16.2 % — ABNORMAL HIGH (ref 11.5–15.5)
WBC: 3.5 10*3/uL — ABNORMAL LOW (ref 4.0–10.5)
nRBC: 0 % (ref 0.0–0.2)

## 2020-01-05 LAB — URINALYSIS, ROUTINE W REFLEX MICROSCOPIC
Bilirubin Urine: NEGATIVE
Glucose, UA: NEGATIVE mg/dL
Hgb urine dipstick: NEGATIVE
Ketones, ur: NEGATIVE mg/dL
Leukocytes,Ua: NEGATIVE
Nitrite: NEGATIVE
Protein, ur: NEGATIVE mg/dL
Specific Gravity, Urine: 1.01 (ref 1.005–1.030)
pH: 7.5 (ref 5.0–8.0)

## 2020-01-05 LAB — LIPASE, BLOOD: Lipase: 22 U/L (ref 11–51)

## 2020-01-05 MED ORDER — SODIUM CHLORIDE 0.9 % IV BOLUS
1000.0000 mL | Freq: Once | INTRAVENOUS | Status: AC
  Administered 2020-01-05: 1000 mL via INTRAVENOUS

## 2020-01-05 MED ORDER — ONDANSETRON 4 MG PO TBDP: 4.0000 mg | ORAL_TABLET | Freq: Three times a day (TID) | ORAL | 0 refills | Status: DC | PRN

## 2020-01-05 MED ORDER — FENTANYL CITRATE (PF) 100 MCG/2ML IJ SOLN
25.0000 ug | Freq: Once | INTRAMUSCULAR | Status: AC
  Administered 2020-01-05: 25 ug via INTRAVENOUS
  Filled 2020-01-05: qty 2

## 2020-01-05 MED ORDER — IOHEXOL 300 MG/ML  SOLN
100.0000 mL | Freq: Once | INTRAMUSCULAR | Status: AC | PRN
  Administered 2020-01-05: 100 mL via INTRAVENOUS

## 2020-01-05 NOTE — Discharge Instructions (Addendum)
Take medications to help with your symptoms. Follow-up with your primary care provider. Return to the ER for any worsening abdominal pain, vomiting or coughing up blood, chest pain, shortness of breath.

## 2020-01-05 NOTE — Telephone Encounter (Signed)
Checked Dr. Trena Platt inboxes, we still do not have records. Spoke with pt. She is going to call her former physician to follow up on this.

## 2020-01-05 NOTE — ED Provider Notes (Signed)
[facility] EMERGENCY DEPARTMENT Provider Note   CSN: [id number] Arrival date & time: 01/05/20  1656     History Chief Complaint  Patient presents with  . Abdominal Pain    Shirley Mcdonald is a 69 y.o. female with past medical history of diabetes, familial Mediterranean fever, hypertension, hypothyroidism presenting to the ED with a chief complaint of abdominal pain and nausea.  Since yesterday has been experiencing the symptoms that has not been controlled with Tylenol or CBD oil.  She is concerned that this could be a flareup of her FMF but usually she is able to control her symptoms at home.  States that the last time she had pain this bad was "in 1984 when they had to do in exploratory laparoscopy because they thought I had dead bowel. It wasn't dead but they said my bowel was bigger than it should be."  Since then she has been on colchicine daily to help with her FMF.  She denies any vomiting, changes to bowel movements, urinary symptoms, fever, chest pain, shortness of breath or sick contacts with similar symptoms, suspicious food intake.  HPI     Past Medical History:  Diagnosis Date  . Alpha (0) thalassemia (HCC)    Minor  . Alpha thalassemia minor   . Arthritis   . Asthma   . Complication of anesthesia    Combative  . Depression   . Diabetes mellitus without complication ([place])   . Diabetes mellitus without complication ([place])    type 2   . Dysrhythmia   . Familial Mediterranean fever ([place])   . Headache   . History of hiatal hernia   . Hypertension   . Hypothyroidism   . Pneumonia   . PONV (postoperative nausea and vomiting) 1979  . Sleep apnea   . Sleep apnea    No CPOP    Patient Active Problem List   Diagnosis Date Noted  . Closed fracture of left proximal humerus 02/11/2019    Past Surgical History:  Procedure Laterality Date  . APPENDECTOMY    . ARTHROSCOPY KNEE W/ DRILLING    . CESAREAN SECTION     x2  . CESAREAN SECTION  [id number]  .  CHOLECYSTECTOMY    . CHOLECYSTECTOMY  1975  . CYST EXCISION Left    arm  . EXPLORATORY LAPAROTOMY  1985  . HERNIA REPAIR     x3  . HERNIA REPAIR     3 hernias  . KNEE SURGERY Left 1900  . LAPAROTOMY  1985  . LEG AMPUTATION     AKA  . LEG AMPUTATION Right 2012  . 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 REVERSE SHOULDER ARTHROPLASTY Left 02/11/2019   Procedure: REVERSE SHOULDER ARTHROPLASTY;  Surgeon: Hiram Gash, MD;  Location: [facility];  Service: Orthopedics;  Laterality: Left;  . THIGH FASCIOTOMY Right   . TONSILLECTOMY    . TOTAL SHOULDER ARTHROPLASTY Right 02/11/2019  . ULNAR NERVE TRANSPOSITION Left 08/12/2019   Procedure: LEFT ELBOW ULNAR NERVE DISCOMPRESSION AND TRANSPOSITION;  Surgeon: Hiram Gash, MD;  Location: [facility];  Service: Orthopedics;  Laterality: Left;     OB History   No obstetric history on file.     No family history on file.  Social History   Tobacco Use  . Smoking status: Former Smoker    Packs/day: 1.00    Years: 40.00    Pack years: 40.00    Types: Cigarettes    Quit date: 1978  Years since quitting: 43.4  . Smokeless tobacco: Never Used  . Tobacco comment: 2015  Substance Use Topics  . Alcohol use: Never  . Drug use: Never    Home Medications Prior to Admission medications   Medication Sig Start Date End Date Taking? Authorizing Provider  Aspirin Buf,CaCarb-MgCarb-MgO, 81 MG TABS Take 81 mg by mouth daily.   Yes [provider]  atorvastatin (LIPITOR) 20 MG tablet Take 20 mg by mouth every evening. 06/24/16  Yes [provider]  buPROPion (WELLBUTRIN XL) 300 MG 24 hr tablet Take 300 mg by mouth daily. 08/17/16  Yes [provider]  calcium-vitamin D (OSCAL WITH D) 500-200 MG-UNIT tablet Take 1 tablet by mouth 2 (two) times a day.   Yes [provider]  Cholecalciferol (VITAMIN D3) 125 MCG (5000 UT) TABS Take 5,000 Units by mouth every evening.   Yes [provider]  colchicine 0.6 MG  tablet Take 0.6 mg by mouth 2 (two) times daily.   Yes [provider]  insulin lispro (HUMALOG KWIKPEN) 100 UNIT/ML KwikPen Inject 1-10 Units into the skin 3 (three) times daily with meals. Sliding scale 08/15/16  Yes [provider]  Boris Lown Oil 500 MG CAPS Take 500 mg by mouth daily.   Yes [provider]  lactobacillus acidophilus (BACID) TABS tablet Take 1 tablet by mouth daily.   Yes [provider]  lamoTRIgine (LAMICTAL) 150 MG tablet Take 150 mg by mouth at bedtime.   Yes [provider]  levothyroxine (SYNTHROID) 50 MCG tablet Take 50 mcg by mouth daily before breakfast.   Yes [provider]  losartan-hydrochlorothiazide (HYZAAR) 100-25 MG tablet Take 1 tablet by mouth daily. 07/23/17  Yes [provider]  metFORMIN (GLUCOPHAGE-XR) 500 MG 24 hr tablet Take 500 mg by mouth 2 (two) times daily with a meal.  07/25/16  Yes [provider]  Multiple Vitamin (MULTIVITAMIN WITH MINERALS) TABS tablet Take 1 tablet by mouth daily.   Yes [provider]  omeprazole (PRILOSEC) 20 MG capsule Take 20 mg by mouth at bedtime.    Yes [provider]  pregabalin (LYRICA) 150 MG capsule Take 150 mg by mouth 2 (two) times a day. 09/04/17  Yes [provider]  traZODone (DESYREL) 50 MG tablet Take 50 mg by mouth at bedtime. 07/06/19  Yes [provider]  venlafaxine XR (EFFEXOR-XR) 75 MG 24 hr capsule Take 225 mg by mouth daily with breakfast.   Yes [provider]  albuterol (VENTOLIN HFA) 108 (90 Base) MCG/ACT inhaler Inhale 2 puffs into the lungs every 6 (six) hours as needed for wheezing or shortness of breath.    [provider]  Coenzyme Q10 (CO Q-10 PO) Take 1 capsule by mouth daily.    [provider]  fluticasone (FLONASE) 50 MCG/ACT nasal spray Place 2 sprays into both nostrils at bedtime.    [provider]  ondansetron (ZOFRAN ODT) 4 MG disintegrating tablet  Take 1 tablet (4 mg total) by mouth every 8 (eight) hours as needed for nausea or vomiting. 01/05/20   , , PA-C  propranolol ER (INDERAL LA) 60 MG 24 hr capsule Take 60 mg by mouth at bedtime.     [provider]  UNABLE TO FIND Take 0.5 drops by mouth daily as needed. Med Name: CBD Oil- half a dropper  daily as needed    [provider]    Allergies    Hydromorphone hcl, Adhesive [tape], Hydrocodone-acetaminophen, Hydromorphone, Lotrel [amlodipine  besy-benazepril hcl], Oxycontin [oxycodone hcl], Saccharin, Tape, Amlodipine besy-benazepril hcl, and Saccharin  Review of Systems   Review of Systems  Constitutional: Negative for appetite change, chills and fever.  HENT: Negative for ear pain, rhinorrhea, sneezing and sore throat.   Eyes: Negative for photophobia and visual disturbance.  Respiratory: Negative for cough, chest tightness, shortness of breath and wheezing.   Cardiovascular: Negative for chest pain and palpitations.  Gastrointestinal: Positive for abdominal pain and nausea. Negative for blood in stool, constipation, diarrhea and vomiting.  Genitourinary: Negative for dysuria, hematuria and urgency.  Musculoskeletal: Negative for myalgias.  Skin: Negative for rash.  Neurological: Negative for dizziness, weakness and light-headedness.    Physical Exam Updated Vital Signs BP 122/80 (BP Location: Left Arm)   Pulse 80   Temp 98.3 F (36.8 C) (Oral)   Resp 16   Ht 5\' 1"  (1.549 m)   Wt 89.9 kg   SpO2 100%   BMI 37.45 kg/m   Physical Exam Vitals and nursing note reviewed.  Constitutional:      General: She is not in acute distress.    Appearance: She is well-developed. She is obese.  HENT:     Head: Normocephalic and atraumatic.     Nose: Nose normal.  Eyes:     General: No scleral icterus.       Left eye: No discharge.     Conjunctiva/sclera: Conjunctivae normal.  Cardiovascular:     Rate and Rhythm: Normal rate and regular rhythm.      Heart sounds: Normal heart sounds. No murmur. No friction rub. No gallop.   Pulmonary:     Effort: Pulmonary effort is normal. No respiratory distress.     Breath sounds: Normal breath sounds.  Abdominal:     General: Bowel sounds are normal. There is no distension.     Palpations: Abdomen is soft.     Tenderness: There is abdominal tenderness (generalized). There is no guarding.  Musculoskeletal:        General: Normal range of motion.     Cervical back: Normal range of motion and neck supple.     Comments: R AKA.  Skin:    General: Skin is warm and dry.     Findings: No rash.  Neurological:     Mental Status: She is alert.     Motor: No abnormal muscle tone.     Coordination: Coordination normal.     ED Results / Procedures / Treatments   Labs (all labs ordered are listed, but only abnormal results are displayed) Labs Reviewed  COMPREHENSIVE METABOLIC PANEL - Abnormal; Notable for the following components:      Result Value   Potassium 3.4 (*)    Glucose, Bld 134 (*)    BUN 6 (*)    Creatinine, Ser 0.42 (*)    Alkaline Phosphatase 35 (*)    All other components within normal limits  CBC WITH DIFFERENTIAL/PLATELET - Abnormal; Notable for the following components:   WBC 3.5 (*)    RBC 3.85 (*)    Hemoglobin 9.9 (*)    HCT 29.7 (*)    MCV 77.1 (*)    MCH 25.7 (*)    RDW 16.2 (*)    Platelets 138 (*)    All other components within normal limits  URINE CULTURE  LIPASE, BLOOD  URINALYSIS, ROUTINE W REFLEX MICROSCOPIC    EKG None  Radiology CT ABDOMEN PELVIS W CONTRAST  Result Date: 01/05/2020 CLINICAL DATA:  Abdominal pain EXAM: CT  ABDOMEN AND PELVIS WITH CONTRAST TECHNIQUE: Multidetector CT imaging of the abdomen and pelvis was performed using the standard protocol following bolus administration of intravenous contrast. CONTRAST:  OMNIPAQUE IOHEXOL 300 MG/ML  SOLN COMPARISON:  None. FINDINGS: Lower chest: Lung bases are clear. No effusions. Heart is normal  size. Hepatobiliary: Prior cholecystectomy. Mildly nodular contours of the liver surface suggests the possibility of cirrhosis. No focal hepatic abnormality. Pancreas: No focal abnormality or ductal dilatation. Spleen: Splenomegaly with a craniocaudal length of 17 cm. Adrenals/Urinary Tract: No adrenal abnormality. No focal renal abnormality. No stones or hydronephrosis. Urinary bladder is unremarkable. Stomach/Bowel: Stomach, large and small bowel grossly unremarkable. Moderate stool burden throughout the colon. Vascular/Lymphatic: Aortic atherosclerosis. No enlarged abdominal or pelvic lymph nodes. Reproductive: Uterus and adnexa unremarkable.  No mass. Other: No free fluid or free air. Musculoskeletal: Advanced degenerative disc and facet disease throughout the lumbar spine. Moderate compression fractures noted at L3 and L5. IMPRESSION: Appearance of the liver suggests cirrhosis. Splenomegaly. Aortic atherosclerosis. Moderate compression fractures at L3 and L5. Electronically Signed   By: Charlett Nose M.D.   On: 01/05/2020 19:02    Procedures Procedures (including critical care time)  Medications Ordered in ED Medications  sodium chloride 0.9 % bolus 1,000 mL (0 mLs Intravenous Stopped 01/05/20 1914)  fentaNYL (SUBLIMAZE) injection 25 mcg (25 mcg Intravenous Given 01/05/20 1809)  iohexol (OMNIPAQUE) 300 MG/ML solution 100 mL (100 mLs Intravenous Contrast Given 01/05/20 1844)  fentaNYL (SUBLIMAZE) injection 25 mcg (25 mcg Intravenous Given 01/05/20 1952)    ED Course  I have reviewed the triage vital signs and the nursing notes.  Pertinent labs & imaging results that were available during my care of the patient were reviewed by me and considered in my medical decision making (see chart for details).    MDM Rules/Calculators/A&P                      69 year old female with a past medical history of diabetes, familial Mediterranean fever, hypertension, hypothyroidism presenting to the ED with a  chief complaint of abdominal pain and nausea.  Symptoms began yesterday.  She states that this feels different than her usual eczema flareups that she is able to control with Tylenol and CBD oil at home.  She does take colchicine daily to help with her FMF.  On exam abdomen is generally tender without rebound or guarding.  She denies urinary symptoms, changes to bowel movements, vomiting.  Lab work significant for hemoglobin of 9.9, slightly lower than baseline.  Lipase, urinalysis and CMP unremarkable.  CT of the abdomen pelvis shows chronic incidental findings of cirrhosis, splenomegaly and moderate compression fractures.  Patient was informed of these results.  She reports improvement in symptoms with medication IV fluids given today.  She was given Zofran by EMS.  Suspect that her symptoms could be viral in nature.  We will have her continue Zofran at home as well as her regular medications.  Patient encouraged to follow-up with her PCP and return for worsening symptoms. Patient discussed with and seen by the attending, Dr. Stevie Kern.  All imaging, if done today, including plain films, CT scans, and ultrasounds, independently reviewed by me, and interpretations confirmed via formal radiology reads.  Patient is hemodynamically stable, in NAD, and able to ambulate in the ED. Evaluation does not show pathology that would require ongoing emergent intervention or inpatient treatment. I explained the diagnosis to the patient. Pain has been managed and has no complaints prior  to discharge. Patient is comfortable with above plan and is stable for discharge at this time. All questions were answered prior to disposition. Strict return precautions for returning to the ED were discussed. Encouraged follow up with PCP.   An After Visit Summary was printed and given to the patient.   Portions of this note were generated with Scientist, clinical (histocompatibility and immunogenetics). Dictation errors may occur despite best attempts at  proofreading.  Final Clinical Impression(s) / ED Diagnoses Final diagnoses:  Nausea    Rx / DC Orders ED Discharge Orders         Ordered    ondansetron (ZOFRAN ODT) 4 MG disintegrating tablet  Every 8 hours PRN     01/05/20 1951           Dietrich Pates, PA-C 01/05/20 1954    Milagros Loll, MD 01/06/20 1311

## 2020-01-05 NOTE — ED Triage Notes (Signed)
Pt has hx of Familial Mediterranean Fever and has flare ups on occasion.  Sx are always the same.  She started a flare yesterday.  Having Abd pain, n/v.  Zofran 4mg  given by EMS at 1655.

## 2020-01-05 NOTE — Telephone Encounter (Signed)
Can someone please check and see if the pt's records were received, thanks

## 2020-01-06 ENCOUNTER — Encounter (HOSPITAL_BASED_OUTPATIENT_CLINIC_OR_DEPARTMENT_OTHER): Payer: Medicare Other | Admitting: Pulmonary Disease

## 2020-01-06 LAB — URINE CULTURE: Culture: <10000 — AB

## 2020-01-15 ENCOUNTER — Other Ambulatory Visit (HOSPITAL_COMMUNITY)
Admission: RE | Admit: 2020-01-15 | Discharge: 2020-01-15 | Disposition: A | Discharge status: Home / Self Care | Payer: Medicare Other | Source: Ambulatory Visit | Attending: Pulmonary Disease | Admitting: Pulmonary Disease

## 2020-01-15 DIAGNOSIS — Z01812 Encounter for preprocedural laboratory examination: Secondary | ICD-10-CM | POA: Insufficient documentation

## 2020-01-15 DIAGNOSIS — Z20822 Contact with and (suspected) exposure to covid-19: Secondary | ICD-10-CM | POA: Insufficient documentation

## 2020-01-15 LAB — SARS CORONAVIRUS 2 (TAT 6-24 HRS): SARS Coronavirus 2: NEGATIVE

## 2020-01-17 ENCOUNTER — Ambulatory Visit (HOSPITAL_BASED_OUTPATIENT_CLINIC_OR_DEPARTMENT_OTHER): Payer: Medicare Other | Attending: Pulmonary Disease | Admitting: Pulmonary Disease

## 2020-01-17 DIAGNOSIS — G4733 Obstructive sleep apnea (adult) (pediatric): Secondary | ICD-10-CM | POA: Insufficient documentation

## 2020-01-21 ENCOUNTER — Telehealth: Payer: Self-pay | Admitting: Pulmonary Disease

## 2020-01-21 DIAGNOSIS — G4733 Obstructive sleep apnea (adult) (pediatric): Secondary | ICD-10-CM

## 2020-01-21 NOTE — Procedures (Signed)
POLYSOMNOGRAPHY  Last, First: Shirley Mcdonald, Shirley Mcdonald MRN: [id number] Gender: Female Age (years): 22 Weight (lbs): 198 DOB: 1951/02/16 BMI: 37 Primary Care: No PCP Epworth Score: 7 Referring: Tomma Lightning MD Technician: Cherylann Parr Interpreting: Tomma Lightning MD Study Type: CPAP Ordered Study Type: CPAP Study date: 01/17/2020 Location: [place] CLINICAL INFORMATION Shirley Mcdonald is a 69 year old Female and was referred to the sleep center for evaluation of N/A. Indications include Witnessed Apneas, Witnesses Apnea / Gasping During Sleep.  MEDICATIONS Patient self administered medications include: CALCIUM, LYRICA, OMIPROZOLE, TRAZADONE, COLCHICINE, TRAZODONE. Medications administered during study include No sleep medicine administered.  SLEEP STUDY TECHNIQUE The patient underwent an attended overnight polysomnography titration to assess the effects of CPAP therapy. The following variables were monitored: EEG(C4-A1, C3-A2, O1-A2, O2-A1), EOG, submental and leg EMG, ECG, oxyhemoglobin saturation by pulse oximetry, thoracic and abdominal respiratory effort belts, nasal/oral airflow by pressure sensor, body position sensor and snoring sensor. CPAP pressure was titrated to eliminate apneas, hypopneas and oxygen desaturation. Hypopneas were scored per AASM definition IB (4% desaturation)  TECHNICIAN COMMENTS Comments added by Technician: RESMED F10 XSMALL WAS USED FOR THIS STUDY Comments added by Scorer: N/A SLEEP ARCHITECTURE The study was initiated at 10:52:56 PM and terminated at 4:51:24 AM. Total recorded time was 358.5 minutes. EEG confirmed total sleep time was 305.5 minutes yielding a sleep efficiency of 85.2%%. Sleep onset after lights out was 13.1 minutes with a REM latency of 61.0 minutes. The patient spent 6.7%% of the night in stage N1 sleep, 70.5%% in stage N2 sleep, 0.0%% in stage N3 and 22.8% in REM. The Arousal Index was 3.9/hour. RESPIRATORY PARAMETERS The overall AHI was  17.1 per hour, and the RDI was 17.1 events/hour with a central apnea index of 3.5per hour. The most appropriate setting of CPAP was 11 cm H2O. At this setting, the sleep efficiency was 96% and the patient was supine for 0%. The AHI was 7.6 events per hour, and the RDI was 7.6 events/hour (with 3.5 central events) and the arousal index was 0.0 per hour.The oxygen nadir was 92.0% during sleep.    The cumulative time under 88% oxygen saturation was 5.5 minutes  LEG MOVEMENT DATA The total leg movements were 145 with a resulting leg movement index of 28.5/hr. Associated arousal with leg movement index was 0.2/hr. CARDIAC DATA The underlying cardiac rhythm was most consistent with sinus rhythm. Mean heart rate during sleep was 80.9 bpm. Additional rhythm abnormalities include None.   IMPRESSIONS - EKG showed no cardiac abnormalities. - No snoring was audible during this study. - Moderate Oxygen Desaturation - Moderate Obstructive Sleep apnea(OSA) Optimal pressure attained. - No Significant Central Sleep Apnea (CSA) - No significant periodic leg movements(PLMs) during sleep, no significant associated arousals. - Normal sleep efficiency, normal primary sleep latency, short REM sleep latency and no slow wave latency.   DIAGNOSIS - Obstructive Sleep Apnea (327.23 [G47.33 ICD-10])   RECOMMENDATIONS - Trial of CPAP therapy on 11 cm H2O with a X-Small size Resmed Full Face Mask AirFit F10 for Her mask and heated humidification. - Avoid alcohol, sedatives and other CNS depressants that may worsen sleep apnea and disrupt normal sleep architecture. - Sleep hygiene should be reviewed to assess factors that may improve sleep quality. - Weight management and regular exercise should be initiated or continued. - Return to Sleep Center for re-evaluation after 4 weeks of therapy  [Electronically signed] 01/21/2020 06:18 PM  Virl Diamond MD NPI: [id number]

## 2020-01-21 NOTE — Telephone Encounter (Signed)
Call patient  Sleep study result  Date of study: 01/17/2020   Impression: Moderate obstructive sleep apnea  Recommendation:  DME referral  Trial of CPAP therapy on 11 cm H2O with a X-Small size Resmed Full Face Mask AirFit F10 for Her mask and heated humidification.  Above pressure treated patients sleep apnea very well  Follow-up in office 4 to 6 weeks after set up

## 2020-01-28 NOTE — Telephone Encounter (Signed)
Sleep study reviewed on 01/27/2020 in office visit, orders for new CPAP placed.

## 2020-02-17 ENCOUNTER — Telehealth: Payer: Self-pay | Admitting: Pulmonary Disease

## 2020-02-17 DIAGNOSIS — G4733 Obstructive sleep apnea (adult) (pediatric): Secondary | ICD-10-CM

## 2020-02-19 NOTE — Telephone Encounter (Signed)
Patient advised of results to the cpap titration and is okay with getting cpap. Order has been place and follow up apt is made .nothing further is needed at this time.

## 2020-02-19 NOTE — Telephone Encounter (Signed)
C-

## 2020-02-19 NOTE — Telephone Encounter (Signed)
Spoke with Dr.Olalere he will review results for cpap titration so we can order new bipap. Will follow up. LVM for Patient.

## 2020-02-19 NOTE — Telephone Encounter (Signed)
I'm working on this with [employer].  Message sent to [employer].

## 2020-02-23 ENCOUNTER — Telehealth: Payer: Self-pay | Admitting: Pulmonary Disease

## 2020-02-23 NOTE — Telephone Encounter (Signed)
I have scheduled this and patient is aware nothing further is needed at this time. [employer] aware.

## 2020-02-25 ENCOUNTER — Ambulatory Visit (INDEPENDENT_AMBULATORY_CARE_PROVIDER_SITE_OTHER): Payer: Medicare Other | Admitting: Pulmonary Disease

## 2020-02-25 ENCOUNTER — Encounter: Payer: Self-pay | Admitting: Pulmonary Disease

## 2020-02-25 ENCOUNTER — Other Ambulatory Visit: Payer: Self-pay

## 2020-02-25 ENCOUNTER — Telehealth: Payer: Self-pay | Admitting: *Deleted

## 2020-02-25 DIAGNOSIS — G4733 Obstructive sleep apnea (adult) (pediatric): Secondary | ICD-10-CM

## 2020-02-25 NOTE — Assessment & Plan Note (Signed)
Plan: Trial of CPAP therapy on 11 cm H2O with a X-Small size Resmed Full Face Mask AirFit F10 for Her mask and heated humidification.  Patient to keep follow-up with Dr. Val Eagle in August/2021 to check CPAP compliance  Patient to contact her office if [employer] D[facility] has not reached out to the patient over the next 3 to 5 days

## 2020-02-25 NOTE — Telephone Encounter (Signed)
Spoke with Shirley Mcdonald Me with [employer].  Advised that orders had been placed for CPAP and that sleep study had already been completed in February and April of this year and that patient had a telephonic visit with [name] NP.  Advised that patient would need CPAP prior to 03/08/2020 as she has an upcoming appointment with Dr. [name].  Morrie Sheldon advised he was not in the office, but would contact the patient today and call back once he is in the office to follow up.

## 2020-02-25 NOTE — Patient Instructions (Addendum)
You were seen today by Coral Ceo, NP  for:   1. Obstructive sleep apnea  Trial of CPAP therapy on 11 cm H2O with a X-Small size Resmed Full Face Mask AirFit F10 for Her mask and heated humidification.  We recommend that you start using your CPAP daily >>>Keep up the hard work using your device >>> Goal should be wearing this for the entire night that you are sleeping, at least 4 to 6 hours  Remember:  . Do not drive or operate heavy machinery if tired or drowsy.  . Please notify the supply company and office if you are unable to use your device regularly due to missing supplies or machine being broken.  . Work on maintaining a healthy weight and following your recommended nutrition plan  . Maintain proper daily exercise and movement  . Maintaining proper use of your device can also help improve management of other chronic illnesses such as: Blood pressure, blood sugars, and weight management.   BiPAP/ CPAP Cleaning:  >>>Clean weekly, with Dawn soap, and bottle brush.  Set up to air dry. >>> Wipe mask out daily with wet wipe or towelette    Follow Up:    Return in about 7 weeks (around 04/12/2020), or if symptoms worsen or fail to improve, for Follow up with Dr. [name].   Please do your part to reduce the spread of COVID-19:      Reduce your risk of any infection  and COVID19 by using the similar precautions used for avoiding the common cold or flu:  Marland Kitchen Wash your hands often with soap and warm water for at least 20 seconds.  If soap and water are not readily available, use an alcohol-based hand sanitizer with at least 60% alcohol.  . If coughing or sneezing, cover your mouth and nose by coughing or sneezing into the elbow areas of your shirt or coat, into a tissue or into your sleeve (not your hands). Drinda Butts A MASK when in public  . Avoid shaking hands with others and consider head nods or verbal greetings only. . Avoid touching your eyes, nose, or mouth with unwashed hands.    . Avoid close contact with people who are sick. . Avoid places or events with large numbers of people in one location, like concerts or sporting events. . If you have some symptoms but not all symptoms, continue to monitor at home and seek medical attention if your symptoms worsen. . If you are having a medical emergency, call 911.   ADDITIONAL HEALTHCARE OPTIONS FOR PATIENTS  [facility] / e-Visit: [email or web address]         [facility]: [phone]  [facility]: [phone]                   [facility]: [phone]     It is flu season:   >>> Best ways to protect herself from the flu: Receive the yearly flu vaccine, practice good hand hygiene washing with soap and also using hand sanitizer when available, eat a nutritious meals, get adequate rest, hydrate appropriately   Please contact the office if your symptoms worsen or you have concerns that you are not improving.   Thank you for choosing [facility] for your healthcare, and for allowing [place] to partner with you on your healthcare journey. I am thankful to be able to provide care to you today.   [name] FNP-C

## 2020-02-25 NOTE — Progress Notes (Signed)
Virtual Visit via Telephone Note  I connected with Shirley Mcdonald on 02/25/20 at 11:00 AM EDT by telephone and verified that I am speaking with the correct person using two identifiers.  Location: Patient: Home Provider: Office [facility] - [address] [place], [address], [place], [place] [address]   I discussed the limitations, risks, security and privacy concerns of performing an evaluation and management service by telephone and the availability of in person appointments. I also discussed with the patient that there may be a patient responsible charge related to this service. The patient expressed understanding and agreed to proceed.  Patient consented to consult via telephone: Yes People present and their role in pt care: Pt   History of Present Illness:  69 year old female former smoker followed in our office for obstructive sleep apnea  Smoking history: Former smoker.  Quit 1978.  40-pack-year smoking history Maintenance: none Patient of Dr. [name]  Chief complaint:    69 year old female former smoker followed in our office for obstructive sleep apnea.  Patient was last evaluated in our office in January/2021 at that time it was recommended that she complete a home sleep study she is currently maintained on a BiPAP and compliant  Those results are listed below:  10/17/2019-home sleep study-AHI 11.8, SaO2 low 83%, recommend BiPAP therapy, patient currently on BiPAP  12/10/2019-sleep study-AHI 9.3 12/10/2019-BiPAP titration  Latest recommendations are for the patient to start CPAP therapy based off of last sleep study.  Those recommendations from Dr. Reece Agar listed below:  Trial of CPAP therapy on 11 cm H2O with a X-Small size Resmed Full Face Mask AirFit F10 for Her mask and heated humidification.   Patient is ready to start CPAP therapy.     Observations/Objective:  Social History   Tobacco Use  Smoking Status Former Smoker  . Packs/day: 1.00  . Years:  40.00  . Pack years: 40.00  . Types: Cigarettes  . Quit date: 65  . Years since quitting: 43.5  Smokeless Tobacco Never Used  Tobacco Comment   2015   Immunization History  Administered Date(s) Administered  . Influenza, High Dose Seasonal PF 04/20/2018, 05/16/2019  . Influenza, Quadrivalent, Recombinant, Inj, Pf 05/22/2017  . Pneumococcal Conjugate-13 01/28/2014  . Pneumococcal Polysaccharide-23 04/16/2017  . Tdap 02/23/2009, 08/20/2016  . Zoster 08/21/2015    Assessment and Plan:  Obstructive sleep apnea Plan: Trial of CPAP therapy on 11 cm H2O with a X-Small size Resmed Full Face Mask AirFit F10 for Her mask and heated humidification.  Patient to keep follow-up with Dr. Val Eagle in August/2021 to check CPAP compliance  Patient to contact her office if [facility] has not reached out to the patient over the next 3 to 5 days   Follow Up Instructions:  Return in about 7 weeks (around 04/12/2020), or if symptoms worsen or fail to improve, for Follow up with Dr. [name].   I discussed the assessment and treatment plan with the patient. The patient was provided an opportunity to ask questions and all were answered. The patient agreed with the plan and demonstrated an understanding of the instructions.   The patient was advised to call back or seek an in-person evaluation if the symptoms worsen or if the condition fails to improve as anticipated.  I provided 22 minutes of non-face-to-face time during this encounter.   Coral Ceo, NP

## 2020-02-25 NOTE — Addendum Note (Signed)
Addended by: Delrae Rend on: 02/25/2020 12:48 PM   Modules accepted: Orders

## 2020-04-12 ENCOUNTER — Ambulatory Visit: Payer: Medicare Other | Admitting: Pulmonary Disease

## 2020-05-03 ENCOUNTER — Other Ambulatory Visit: Payer: Self-pay

## 2020-05-03 ENCOUNTER — Encounter: Payer: Self-pay | Admitting: Adult Health

## 2020-05-03 ENCOUNTER — Ambulatory Visit (INDEPENDENT_AMBULATORY_CARE_PROVIDER_SITE_OTHER): Payer: Medicare Other | Admitting: Adult Health

## 2020-05-03 DIAGNOSIS — G4733 Obstructive sleep apnea (adult) (pediatric): Secondary | ICD-10-CM | POA: Diagnosis not present

## 2020-05-03 NOTE — Patient Instructions (Signed)
Keep up the good work Continue on CPAP at bedtime Do not drive if sleepy Work on healthy weight loss Follow-up with Dr. [name] in 4 to 6 months and as needed

## 2020-05-03 NOTE — Progress Notes (Signed)
@Patient  ID: , female    DOB: 1951-04-23, 69 y.o.   MRN: [id number]  Chief Complaint  Patient presents with   Follow-up    [facility]     Referring provider: [id number], MD  HPI: 69 year old female former smoker followed for obstructive sleep apnea  TEST/EVENTS :  /27/2021-home sleep study-AHI 11.8, SaO2 low 83%, recommend BiPAP therapy, patient currently on BiPAP  12/10/2019-sleep study-AHI 9.3 12/10/2019-titration study recommended trial of CPAP 11 cm H2O  05/03/2020 Follow up : [facility]  Patient returns for a 80-month follow-up.  Patient has underlying obstructive sleep apnea has recently been started on CPAP at bedtime.  Patient says she is doing well on CPAP.  Feels more rested and with less daytime sleepiness.  She is now using her CPAP every night getting in about 8 hours.  CPAP download shows excellent compliance with daily average usage 8 hours.  Patient is on CPAP 11 cm H2O.  AHI is 3.2. tolerating CPAP well.   Previously on BIPAP , machine got old. Lost 100lb. Repeat Sleep study and titration , final study showed optimal control on CPAP 11cmH2o.   [facility]. Does not drive .   Requires XS full face mask.   Allergies  Allergen Reactions   Hydromorphone Hcl Rash   Adhesive [Tape]     Causes blistering   Hydrocodone-Acetaminophen Other (See Comments)    Makes face itch    Hydromorphone     Sleepy   Lotrel [Amlodipine Besy-Benazepril Hcl] Cough   Oxycontin [Oxycodone Hcl] Itching   Saccharin     Causes stomach pain    Tape     Blister    Amlodipine Besy-Benazepril Hcl Cough   Saccharin Other (See Comments)    Immunization History  Administered Date(s) Administered   Influenza, High Dose Seasonal PF 04/20/2018, 05/16/2019   Influenza, Quadrivalent, Recombinant, Inj, Pf 05/22/2017   Moderna SARS-COVID-2 Vaccination 10/14/2019, 11/11/2019   Pneumococcal Conjugate-13 01/28/2014   Pneumococcal Polysaccharide-23  04/16/2017   Tdap 02/23/2009, 08/20/2016   Zoster 08/21/2015    Past Medical History:  Diagnosis Date   Alpha (0) thalassemia (HCC)    Minor   Alpha thalassemia minor    Arthritis    Asthma    Complication of anesthesia    Combative   Depression    Diabetes mellitus without complication (HCC)    Diabetes mellitus without complication (HCC)    type 2    Dysrhythmia    Familial Mediterranean fever (HCC)    Headache    History of hiatal hernia    Hypertension    Hypothyroidism    Pneumonia    PONV (postoperative nausea and vomiting) 1979   Sleep apnea    Sleep apnea    No CPOP    Tobacco History: Social History   Tobacco Use  Smoking Status Former Smoker   Packs/day: 1.00   Years: 40.00   Pack years: 40.00   Types: Cigarettes   Quit date: 1978   Years since quitting: 43.7  Smokeless Tobacco Never Used  Tobacco Comment   2015   Counseling given: Not Answered Comment: 2015   Outpatient Medications Prior to Visit  Medication Sig Dispense Refill   albuterol (VENTOLIN HFA) 108 (90 Base) MCG/ACT inhaler Inhale 2 puffs into the lungs every 6 (six) hours as needed for wheezing or shortness of breath.     Aspirin Buf,CaCarb-MgCarb-MgO, 81 MG TABS Take 81 mg by mouth daily.     atorvastatin (LIPITOR) 20  MG tablet Take 20 mg by mouth every evening.     buPROPion (WELLBUTRIN XL) 300 MG 24 hr tablet Take 300 mg by mouth daily.     calcium-vitamin D (OSCAL WITH D) 500-200 MG-UNIT tablet Take 1 tablet by mouth 2 (two) times a day.     Cholecalciferol (VITAMIN D3) 125 MCG (5000 UT) TABS Take 5,000 Units by mouth every evening.     Coenzyme Q10 (CO Q-10 PO) Take 1 capsule by mouth daily.     colchicine 0.6 MG tablet Take 0.6 mg by mouth 2 (two) times daily.     fluticasone (FLONASE) 50 MCG/ACT nasal spray Place 2 sprays into both nostrils at bedtime.     insulin lispro (HUMALOG KWIKPEN) 100 UNIT/ML KwikPen Inject 1-10 Units into the skin 3  (three) times daily with meals. Sliding scale     lactobacillus acidophilus (BACID) TABS tablet Take 1 tablet by mouth daily.     lamoTRIgine (LAMICTAL) 150 MG tablet Take 150 mg by mouth at bedtime.     levothyroxine (SYNTHROID) 50 MCG tablet Take 50 mcg by mouth daily before breakfast.     losartan-hydrochlorothiazide (HYZAAR) 100-25 MG tablet Take 1 tablet by mouth daily.     metFORMIN (GLUCOPHAGE-XR) 500 MG 24 hr tablet Take 500 mg by mouth 2 (two) times daily with a meal.      Multiple Vitamin (MULTIVITAMIN WITH MINERALS) TABS tablet Take 1 tablet by mouth daily.     nortriptyline (PAMELOR) 50 MG capsule Take 50 mg by mouth at bedtime.     ondansetron (ZOFRAN ODT) 4 MG disintegrating tablet Take 1 tablet (4 mg total) by mouth every 8 (eight) hours as needed for nausea or vomiting. 5 tablet 0   pantoprazole (PROTONIX) 20 MG tablet Take 20 mg by mouth daily.     pregabalin (LYRICA) 150 MG capsule Take 150 mg by mouth 2 (two) times a day.     traZODone (DESYREL) 50 MG tablet Take 50 mg by mouth at bedtime.     UNABLE TO FIND Take 0.5 drops by mouth daily as needed. Med Name: CBD Oil- half a dropper  daily as needed     venlafaxine XR (EFFEXOR-XR) 75 MG 24 hr capsule Take 225 mg by mouth daily with breakfast.     Krill Oil 500 MG CAPS Take 500 mg by mouth daily.     omeprazole (PRILOSEC) 20 MG capsule Take 20 mg by mouth at bedtime.      propranolol ER (INDERAL LA) 60 MG 24 hr capsule Take 60 mg by mouth at bedtime.      No facility-administered medications prior to visit.     Review of Systems:   Constitutional:   No  weight loss, night sweats,  Fevers, chills, fatigue, or  lassitude.  HEENT:   No headaches,  Difficulty swallowing,  Tooth/dental problems, or  Sore throat,                No sneezing, itching, ear ache, nasal congestion, post nasal drip,   CV:  No chest pain,  Orthopnea, PND, swelling in lower extremities, anasarca, dizziness, palpitations, syncope.    GI  No heartburn, indigestion, abdominal pain, nausea, vomiting, diarrhea, change in bowel habits, loss of appetite, bloody stools.   Resp: No shortness of breath with exertion or at rest.  No excess mucus, no productive cough,  No non-productive cough,  No coughing up of blood.  No change in color of mucus.  No wheezing.  No  chest wall deformity  Skin: no rash or lesions.  GU: no dysuria, change in color of urine, no urgency or frequency.  No flank pain, no hematuria   MS:  No joint pain or swelling.  No decreased range of motion.  No back pain.    Physical Exam  BP 112/72 (BP Location: Right Arm, Cuff Size: Normal)    Pulse (!) 102    Temp 98.8 F (37.1 C) (Temporal)    Ht 5\' 1"  (1.549 m)    Wt 198 lb (89.8 kg)    SpO2 97% Comment: RA   BMI 37.41 kg/m   GEN: A/Ox3; pleasant , NAD, well nourished , in motorized scooter.    HEENT:  [place]/AT,   , NOSE-clear, THROAT-clear, no lesions, no postnasal drip or exudate noted. Class 2-3 MP airway   NECK:  Supple w/ fair ROM; no JVD; normal carotid impulses w/o bruits; no thyromegaly or nodules palpated; no lymphadenopathy.    RESP  Clear  P & A; w/o, wheezes/ rales/ or rhonchi. no accessory muscle use, no dullness to percussion  CARD:  RRR, no m/r/g, no peripheral edema, pulses intact, no cyanosis or clubbing.  GI:   Soft & nt; nml bowel sounds; no organomegaly or masses detected.   Musco: Warm bil,   Right AKA.   Neuro: alert, no focal deficits noted.    Skin: Warm, no lesions or rashes    Lab Results:  CBC  BNP No results found for: BNP  ProBNP No results found for: PROBNP  Imaging: No results found.    No flowsheet data found.  No results found for: NITRICOXIDE      Assessment & Plan:   Obstructive sleep apnea Excellent control and compliance on nocturnal CPAP  Plan  Patient Instructions  Keep up the good work Continue on CPAP at bedtime Do not drive if sleepy Work on healthy weight loss Follow-up  with Dr. in 4 to 6 months and as needed        [name], NP 05/03/2020

## 2020-05-03 NOTE — Assessment & Plan Note (Signed)
Excellent control and compliance on nocturnal CPAP  Plan  Patient Instructions  Keep up the good work Continue on CPAP at bedtime Do not drive if sleepy Work on healthy weight loss Follow-up with Dr. [name] in 4 to 6 months and as needed

## 2020-05-17 IMAGING — DX LEFT SHOULDER - 1 VIEW
2 series · 2 of 2 positions shown · non-contrast
Comparison: CT 02/02/2019

CLINICAL DATA: Postop

EXAM:
LEFT SHOULDER - 1 VIEW

[shoulder ap (1 of 2)]
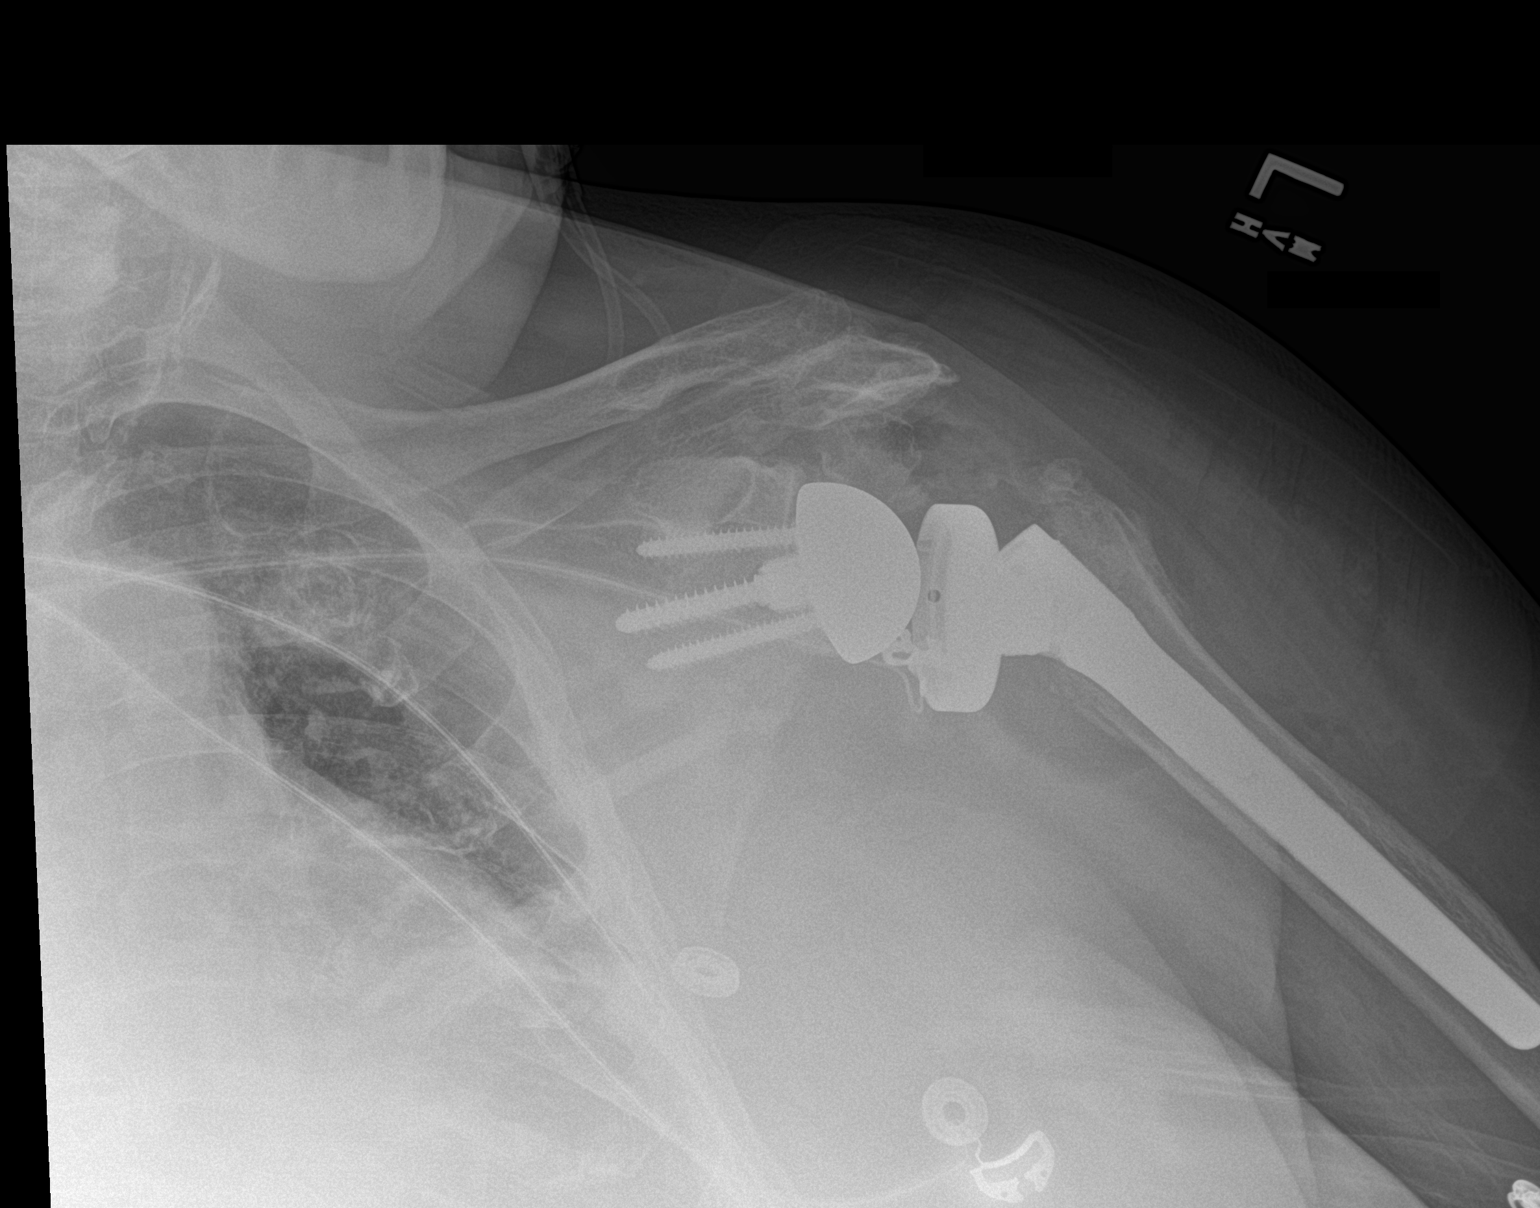

[shoulder ap (2 of 2)]
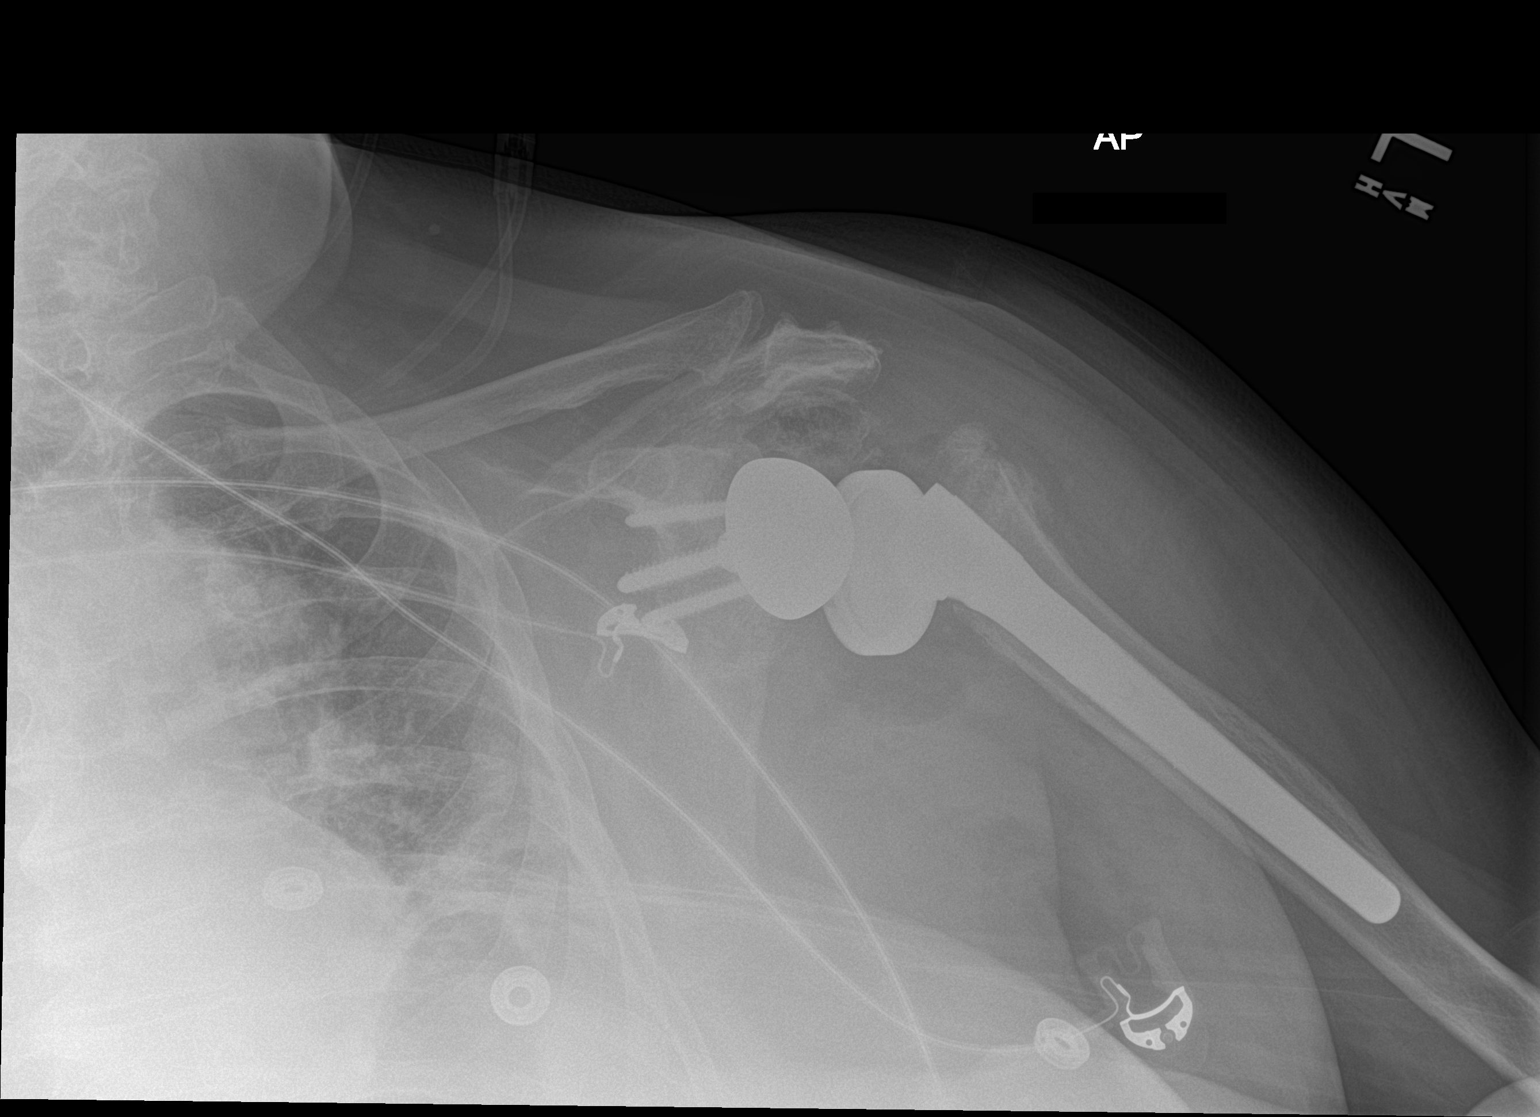

[2 of 2 positions shown; findings below may reference images not displayed]

FINDINGS: Status post left shoulder replacement with normal alignment. Gas in
the soft tissues consistent with recent surgery. Moderate AC joint
degenerative changes. Subacromial loose body or bulky osteophyte.
Faintly visible healing scapular fracture.
IMPRESSION: Status post left shoulder replacement with expected postsurgical
changes.

## 2020-09-10 ENCOUNTER — Encounter (HOSPITAL_BASED_OUTPATIENT_CLINIC_OR_DEPARTMENT_OTHER): Payer: Self-pay | Admitting: Emergency Medicine

## 2020-09-10 ENCOUNTER — Other Ambulatory Visit: Payer: Self-pay

## 2020-09-10 ENCOUNTER — Emergency Department (HOSPITAL_BASED_OUTPATIENT_CLINIC_OR_DEPARTMENT_OTHER): Payer: Medicare Other

## 2020-09-10 ENCOUNTER — Emergency Department (HOSPITAL_BASED_OUTPATIENT_CLINIC_OR_DEPARTMENT_OTHER)
Admission: EM | Admit: 2020-09-10 | Discharge: 2020-09-10 | Disposition: A | Discharge status: Home / Self Care | Payer: Medicare Other | Attending: Emergency Medicine | Admitting: Emergency Medicine

## 2020-09-10 DIAGNOSIS — Z87891 Personal history of nicotine dependence: Secondary | ICD-10-CM | POA: Diagnosis not present

## 2020-09-10 DIAGNOSIS — Z20822 Contact with and (suspected) exposure to covid-19: Secondary | ICD-10-CM | POA: Diagnosis not present

## 2020-09-10 DIAGNOSIS — E119 Type 2 diabetes mellitus without complications: Secondary | ICD-10-CM | POA: Insufficient documentation

## 2020-09-10 DIAGNOSIS — R059 Cough, unspecified: Secondary | ICD-10-CM | POA: Diagnosis present

## 2020-09-10 DIAGNOSIS — Z96611 Presence of right artificial shoulder joint: Secondary | ICD-10-CM | POA: Diagnosis not present

## 2020-09-10 DIAGNOSIS — E039 Hypothyroidism, unspecified: Secondary | ICD-10-CM | POA: Insufficient documentation

## 2020-09-10 DIAGNOSIS — Z79899 Other long term (current) drug therapy: Secondary | ICD-10-CM | POA: Insufficient documentation

## 2020-09-10 DIAGNOSIS — Z7982 Long term (current) use of aspirin: Secondary | ICD-10-CM | POA: Diagnosis not present

## 2020-09-10 DIAGNOSIS — Z794 Long term (current) use of insulin: Secondary | ICD-10-CM | POA: Insufficient documentation

## 2020-09-10 DIAGNOSIS — Z7951 Long term (current) use of inhaled steroids: Secondary | ICD-10-CM | POA: Diagnosis not present

## 2020-09-10 DIAGNOSIS — I10 Essential (primary) hypertension: Secondary | ICD-10-CM | POA: Insufficient documentation

## 2020-09-10 DIAGNOSIS — J45909 Unspecified asthma, uncomplicated: Secondary | ICD-10-CM | POA: Diagnosis not present

## 2020-09-10 DIAGNOSIS — Z96612 Presence of left artificial shoulder joint: Secondary | ICD-10-CM | POA: Insufficient documentation

## 2020-09-10 DIAGNOSIS — Z7984 Long term (current) use of oral hypoglycemic drugs: Secondary | ICD-10-CM | POA: Diagnosis not present

## 2020-09-10 MED ORDER — GUAIFENESIN 100 MG/5ML PO SOLN
5.0000 mL | Freq: Once | ORAL | Status: AC
  Administered 2020-09-10: 100 mg via ORAL
  Filled 2020-09-10: qty 10

## 2020-09-10 MED ORDER — LIDOCAINE 5 % EX PTCH
1.0000 | MEDICATED_PATCH | CUTANEOUS | Status: DC
  Administered 2020-09-10: 1 via TRANSDERMAL
  Filled 2020-09-10: qty 1

## 2020-09-10 MED ORDER — IPRATROPIUM BROMIDE 0.02 % IN SOLN: 0.5000 mg | Freq: Four times a day (QID) | RESPIRATORY_TRACT | 0 refills | Status: DC

## 2020-09-10 MED ORDER — BENZONATATE 100 MG PO CAPS: 100.0000 mg | ORAL_CAPSULE | Freq: Three times a day (TID) | ORAL | 0 refills | Status: DC

## 2020-09-10 NOTE — ED Provider Notes (Signed)
[facility] EMERGENCY DEPARTMENT Provider Note   CSN: [id number] Arrival date & time: 09/10/20  1811     History Chief Complaint  Shirley Mcdonald presents with  . Cough    Shirley Mcdonald is a 70 y.o. female.  The history is provided by the Shirley Mcdonald.  Cough Cough characteristics:  Non-productive Severity:  Moderate Onset quality:  Gradual Timing:  Intermittent Progression:  Unchanged Chronicity:  New Context: upper respiratory infection   Context: not animal exposure   Relieved by:  Nothing Worsened by:  Nothing Ineffective treatments:  None tried (doxycycline, steroids ) Associated symptoms: no chest pain, no eye discharge, no fever, no rash, no rhinorrhea, no weight loss and no wheezing   Risk factors: no chemical exposure        Past Medical History:  Diagnosis Date  . Alpha (0) thalassemia (HCC)    Minor  . Alpha thalassemia minor   . Arthritis   . Asthma   . Complication of anesthesia    Combative  . Depression   . Diabetes mellitus without complication (HCC)   . Diabetes mellitus without complication (HCC)    type 2   . Dysrhythmia   . Familial Mediterranean fever (HCC)   . Headache   . History of hiatal hernia   . Hypertension   . Hypothyroidism   . Pneumonia   . PONV (postoperative nausea and vomiting) 1979  . Sleep apnea   . Sleep apnea    No CPOP    Shirley Mcdonald Active Problem List   Diagnosis Date Noted  . Obstructive sleep apnea 02/25/2020  . Closed fracture of left proximal humerus 02/11/2019    Past Surgical History:  Procedure Laterality Date  . APPENDECTOMY    . ARTHROSCOPY KNEE W/ DRILLING    . CESAREAN SECTION     x2  . CESAREAN SECTION  [id number]  . CHOLECYSTECTOMY    . CHOLECYSTECTOMY  1975  . CYST EXCISION Left    arm  . EXPLORATORY LAPAROTOMY  1985  . HERNIA REPAIR     x3  . HERNIA REPAIR     3 hernias  . KNEE SURGERY Left 1900  . LAPAROTOMY  1985  . LEG AMPUTATION     AKA  . LEG AMPUTATION Right 2012  .  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 REVERSE SHOULDER ARTHROPLASTY Left 02/11/2019   Procedure: REVERSE SHOULDER ARTHROPLASTY;  Surgeon: Bjorn Pippin, MD;  Location: [facility];  Service: Orthopedics;  Laterality: Left;  . THIGH FASCIOTOMY Right   . TONSILLECTOMY    . TOTAL SHOULDER ARTHROPLASTY Right 02/11/2019  . ULNAR NERVE TRANSPOSITION Left 08/12/2019   Procedure: LEFT ELBOW ULNAR NERVE DISCOMPRESSION AND TRANSPOSITION;  Surgeon: Bjorn Pippin, MD;  Location: [facility];  Service: Orthopedics;  Laterality: Left;     OB History   No obstetric history on file.     History reviewed. No pertinent family history.  Social History   Tobacco Use  . Smoking status: Former Smoker    Packs/day: 1.00    Years: 40.00    Pack years: 40.00    Types: Cigarettes    Quit date: 1978    Years since quitting: 44.0  . Smokeless tobacco: Never Used  . Tobacco comment: 2015  Vaping Use  . Vaping Use: Never used  Substance Use Topics  . Alcohol use: Not Currently  . Drug use: Never    Home Medications Prior to Admission medications   Medication Sig Start Date End Date Taking?  Authorizing Provider  albuterol (VENTOLIN HFA) 108 (90 Base) MCG/ACT inhaler Inhale 2 puffs into the lungs every 6 (six) hours as needed for wheezing or shortness of breath.    [provider]  Aspirin Buf,CaCarb-MgCarb-MgO, 81 MG TABS Take 81 mg by mouth daily.    [provider]  atorvastatin (LIPITOR) 20 MG tablet Take 20 mg by mouth every evening. 06/24/16   [provider]  buPROPion (WELLBUTRIN XL) 300 MG 24 hr tablet Take 300 mg by mouth daily. 08/17/16   [provider]  calcium-vitamin D (OSCAL WITH D) 500-200 MG-UNIT tablet Take 1 tablet by mouth 2 (two) times a day.    [provider]  Cholecalciferol (VITAMIN D3) 125 MCG (5000 UT) TABS Take 5,000 Units by mouth every evening.    [provider]  Coenzyme Q10 (CO Q-10 PO) Take 1 capsule by mouth daily.     [provider]  colchicine 0.6 MG tablet Take 0.6 mg by mouth 2 (two) times daily.    [provider]  fluticasone (FLONASE) 50 MCG/ACT nasal spray Place 2 sprays into both nostrils at bedtime.    [provider]  insulin lispro (HUMALOG KWIKPEN) 100 UNIT/ML KwikPen Inject 1-10 Units into the skin 3 (three) times daily with meals. Sliding scale 08/15/16   [provider]  lactobacillus acidophilus (BACID) TABS tablet Take 1 tablet by mouth daily.    [provider]  lamoTRIgine (LAMICTAL) 150 MG tablet Take 150 mg by mouth at bedtime.    [provider]  levothyroxine (SYNTHROID) 50 MCG tablet Take 50 mcg by mouth daily before breakfast.    [provider]  losartan-hydrochlorothiazide (HYZAAR) 100-25 MG tablet Take 1 tablet by mouth daily. 07/23/17   [provider]  metFORMIN (GLUCOPHAGE-XR) 500 MG 24 hr tablet Take 500 mg by mouth 2 (two) times daily with a meal.  07/25/16   [provider]  Multiple Vitamin (MULTIVITAMIN WITH MINERALS) TABS tablet Take 1 tablet by mouth daily.    [provider]  nortriptyline (PAMELOR) 50 MG capsule Take 50 mg by mouth at bedtime.    [provider]  ondansetron (ZOFRAN ODT) 4 MG disintegrating tablet Take 1 tablet (4 mg total) by mouth every 8 (eight) hours as needed for nausea or vomiting. 01/05/20   Khatri, Hina, PA-C  pantoprazole (PROTONIX) 20 MG tablet Take 20 mg by mouth daily.    [provider]  pregabalin (LYRICA) 150 MG capsule Take 150 mg by mouth 2 (two) times a day. 09/04/17   [provider]  traZODone (DESYREL) 50 MG tablet Take 50 mg by mouth at bedtime. 07/06/19   [provider]  UNABLE TO FIND Take 0.5 drops by mouth daily as needed. Med Name: CBD Oil- half a dropper  daily as needed    [provider]  venlafaxine XR (EFFEXOR-XR) 75 MG 24 hr capsule Take 225 mg by mouth daily with breakfast.    [provider]    Allergies    Hydromorphone hcl, Adhesive [tape], Hydrocodone-acetaminophen, Hydromorphone, Lotrel [amlodipine besy-benazepril hcl], Oxycontin [oxycodone hcl], Saccharin, Tape, Amlodipine besy-benazepril hcl, and Saccharin  Review of Systems   Review of Systems  Constitutional: Negative for fever and weight loss.  HENT: Positive for congestion. Negative for rhinorrhea.   Eyes: Negative for discharge.  Respiratory: Positive for cough. Negative for wheezing.   Cardiovascular: Negative for chest pain.  Gastrointestinal: Negative for abdominal pain.  Genitourinary: Negative for difficulty urinating.  Musculoskeletal:  Negative for arthralgias.  Skin: Negative for rash.  Neurological: Negative for dizziness.  Psychiatric/Behavioral: Negative for agitation.  All other systems reviewed and are negative.   Physical Exam Updated Vital Signs BP 111/78   Pulse 99   Temp 99.5 F (37.5 C) (Oral)   Resp (!) 25   Ht 5\' 2"  (1.575 m)   Wt 89.8 kg   SpO2 98%   BMI 36.21 kg/m   Physical Exam Vitals and nursing note reviewed.  Constitutional:      General: Shirley Mcdonald is not in acute distress.    Appearance: Normal appearance.  HENT:     Head: Normocephalic and atraumatic.     Nose: Nose normal.  Eyes:     Conjunctiva/sclera: Conjunctivae normal.     Pupils: Pupils are equal, round, and reactive to light.  Cardiovascular:     Rate and Rhythm: Normal rate and regular rhythm.     Pulses: Normal pulses.     Heart sounds: Normal heart sounds.  Pulmonary:     Effort: Pulmonary effort is normal.     Breath sounds: Normal breath sounds.  Abdominal:     General: Abdomen is flat. Bowel sounds are normal.     Palpations: Abdomen is soft.  Musculoskeletal:        General: Normal range of motion.     Cervical back: Normal range of motion and neck supple.  Skin:    General: Skin is warm and dry.     Capillary Refill: Capillary refill takes less than 2 seconds.  Neurological:      General: No focal deficit present.     Mental Status: Shirley Mcdonald is alert and oriented to person, place, and time.     Deep Tendon Reflexes: Reflexes normal.  Psychiatric:        Mood and Affect: Mood normal.        Behavior: Behavior normal.     ED Results / Procedures / Treatments   Labs (all labs ordered are listed, but only abnormal results are displayed) Labs Reviewed  SARS CORONAVIRUS 2 (TAT 6-24 HRS)    EKG EKG Interpretation  Date/Time:  Saturday September 10 2020 18:33:00 EST Ventricular Rate:  104 PR Interval:    QRS Duration: 112 QT Interval:  348 QTC Calculation: 457 R Axis:   -7 Text Interpretation: Sinus tachycardia with 1st degree A-V block Cannot rule out Anterior infarct , age undetermined Abnormal ECG No STEMI Confirmed by 08-13-1990 [phone]) on 09/10/2020 8:59:49 PM   Radiology DG Chest Port 1 View  Result Date: 09/10/2020 CLINICAL DATA:  Cough, fever, sore throat, chills, and dyspnea for 1 week. Has been treated for upper respiratory infection. EXAM: PORTABLE CHEST 1 VIEW COMPARISON:  02/12/2019 FINDINGS: Normal heart size and pulmonary vascularity. No focal airspace disease or consolidation in the lungs. No blunting of costophrenic angles. No pneumothorax. Mediastinal contours appear intact. Postoperative changes in the left shoulder. Tortuous and calcified aorta. Mildly displaced fractures of the anterior left sixth and seventh ribs, likely acute. IMPRESSION: No evidence of active pulmonary disease. Left anterior rib fractures. Electronically Signed   By: 02/14/2019 M.D.   On: 09/10/2020 19:17    Procedures Procedures (including critical care time)  Medications Ordered in ED Medications  lidocaine (LIDODERM) 5 % 1 patch (has no administration in time range)  guaiFENesin (ROBITUSSIN) 100 MG/5ML solution 100 mg (100 mg Oral Given 09/10/20 2329)    ED Course  I have reviewed the triage vital signs and the nursing  notes.  Pertinent labs & imaging  results that were available during my care of the Shirley Mcdonald were reviewed by me and considered in my medical decision making (see chart for details).    Well appearing.  Will treat rib fractures with lidoderm.  Will start atrovent inhaler and give tessalon.  O2 sats are normal Shirley Mcdonald is well appearing, suspect ongoing symptoms from covid.    Shirley Mcdonald was evaluated in Emergency Department on 09/10/2020 for the symptoms described in the history of present illness. Shirley Mcdonald was evaluated in the context of the global COVID-19 pandemic, which necessitated consideration that the Shirley Mcdonald might be at risk for infection with the SARS-CoV-2 virus that causes COVID-19. Institutional protocols and algorithms that pertain to the evaluation of patients at risk for COVID-19 are in a state of rapid change based on information released by regulatory bodies including the CDC and federal and state organizations. These policies and algorithms were followed during the Shirley Mcdonald's care in the ED.  Final Clinical Impression(s) / ED Diagnoses Final diagnoses:  Cough   Return for intractable cough, coughing up blood, fevers > 100.4 unrelieved by medication, shortness of breath, intractable vomiting, chest pain, shortness of breath, weakness, numbness, changes in speech, facial asymmetry, abdominal pain, passing out, Inability to tolerate liquids or food, cough, altered mental status or any concerns. No signs of systemic illness or infection. The Shirley Mcdonald is nontoxic-appearing on exam and vital signs are within normal limits.  I have reviewed the triage vital signs and the nursing notes. Pertinent labs & imaging results that were available during my care of the Shirley Mcdonald were reviewed by me and considered in my medical decision making (see chart for details). After history, exam, and medical workup I feel the Shirley Mcdonald has been appropriately medically screened and is safe for discharge home. Pertinent diagnoses were discussed with the  Shirley Mcdonald. Shirley Mcdonald was given return precautions.     , , MD 09/10/20 2336

## 2020-09-10 NOTE — ED Triage Notes (Signed)
Pt arrives with c/o cough, fever, sore throat, chills/fever, and dyspnea x 1 week. Pt currently being treated for URI, taking doxycycline, prednisone, and guafenisen

## 2020-09-10 NOTE — ED Notes (Signed)
ED Provider at bedside. Dr. Nicanor Alcon

## 2020-09-11 LAB — SARS CORONAVIRUS 2 (TAT 6-24 HRS): SARS Coronavirus 2: NEGATIVE

## 2020-11-15 IMAGING — DX DG SHOULDER 1V*L*
2 series · 2 of 2 positions shown · non-contrast
Comparison: 02/11/2019.

CLINICAL DATA: Postoperative check.

EXAM:
LEFT SHOULDER

[shoulder ap]
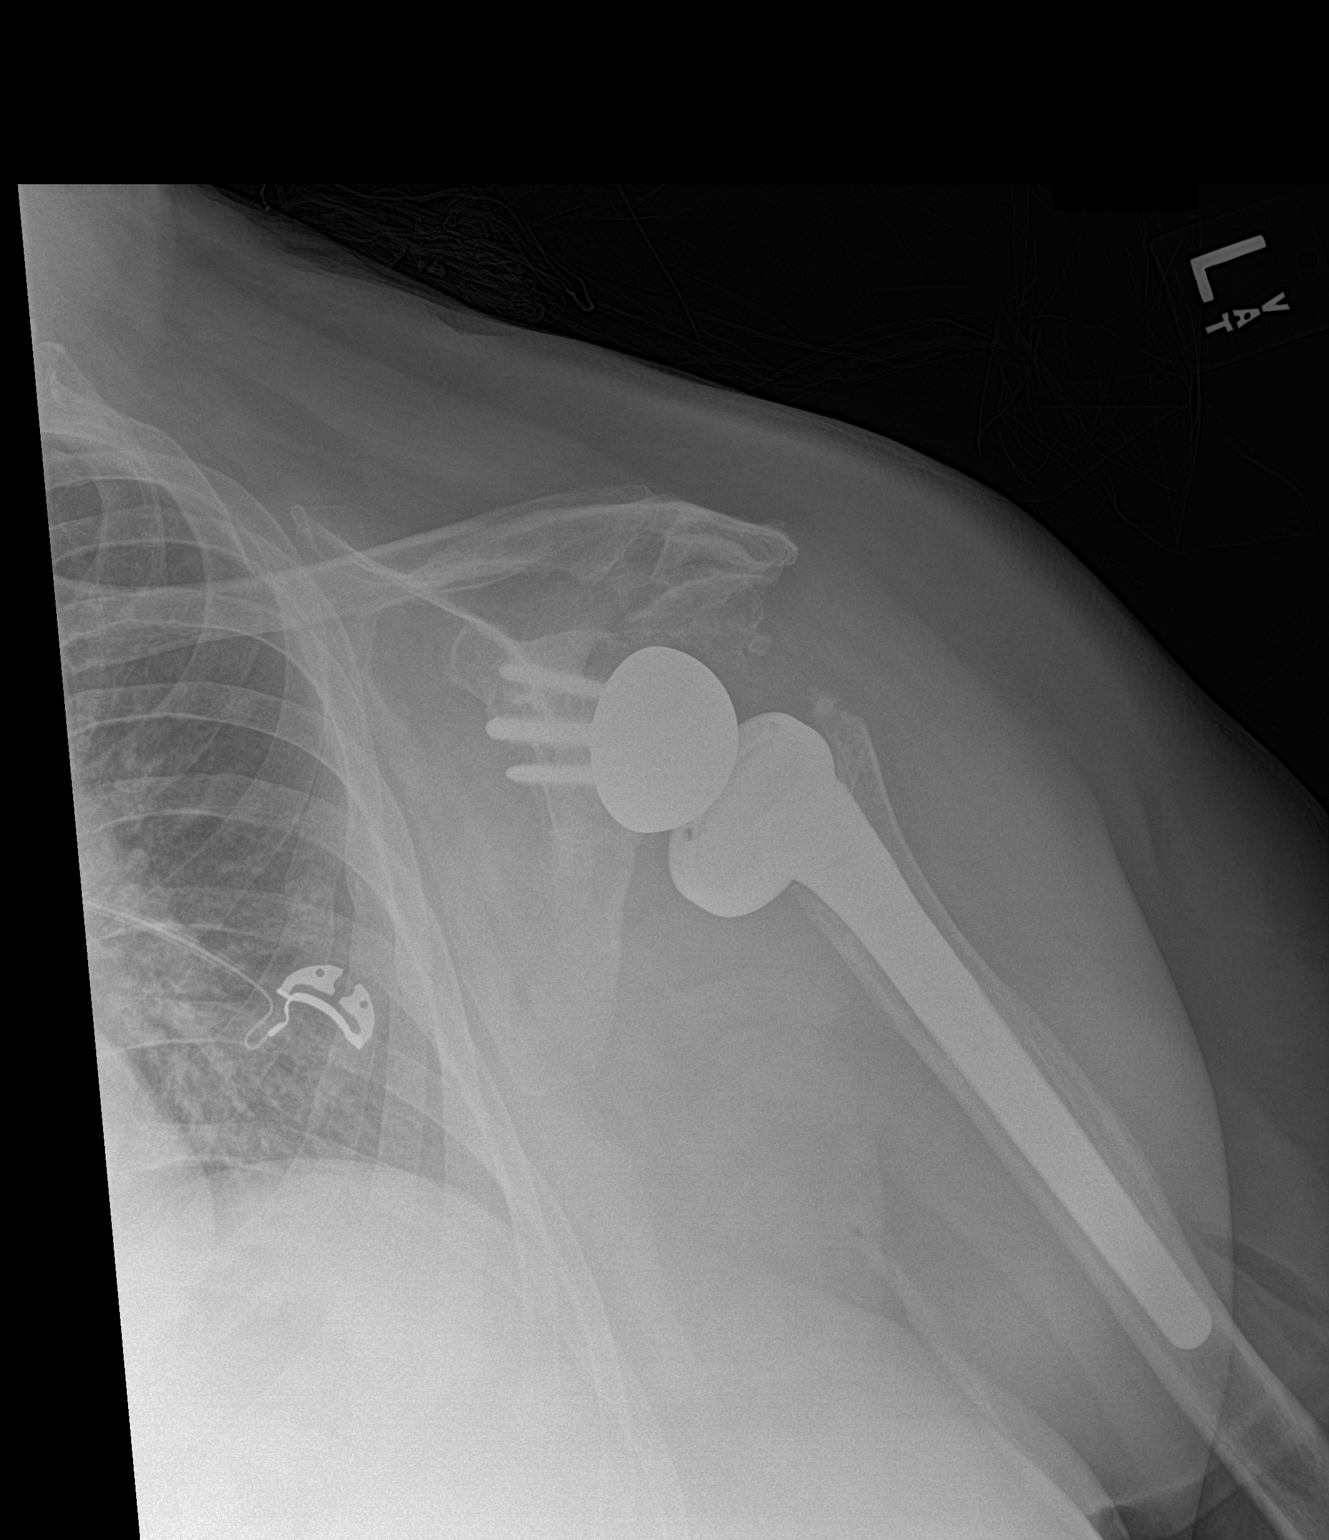

[shoulder obl]
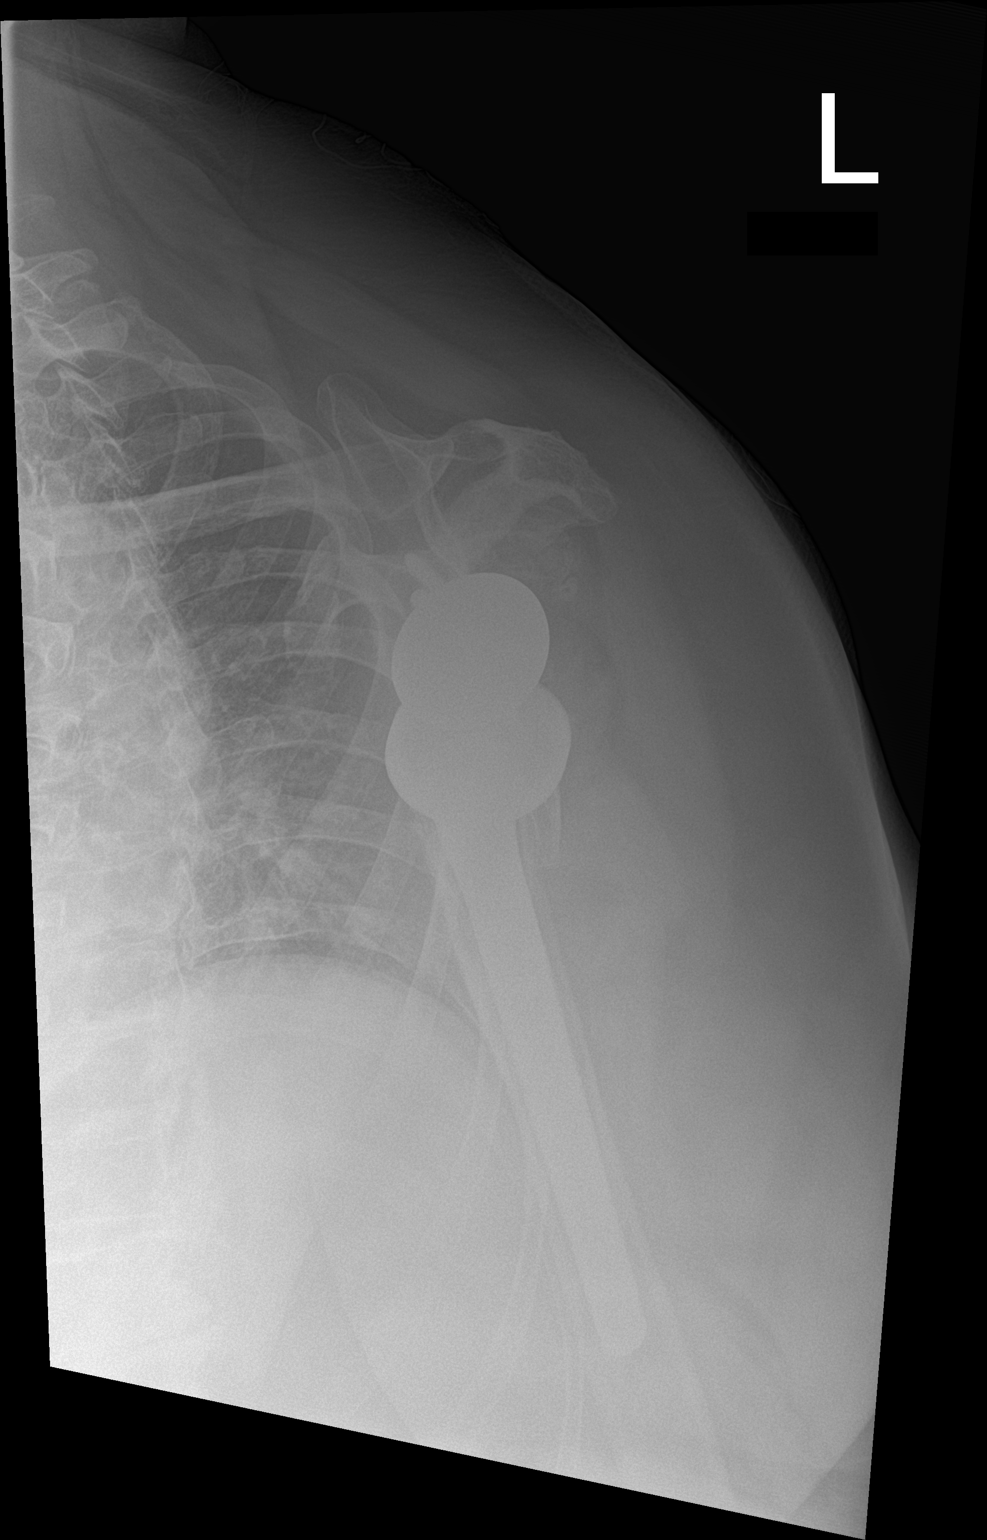

[2 of 2 positions shown; findings below may reference images not displayed]

FINDINGS: Total left shoulder replacement. Hardware intact. Anatomic
alignment. Severe degenerative changes noted about the left
shoulder. Old left anterior fourth and fifth rib fractures. Soft
tissue air noted over the upper extremity.
IMPRESSION: 1. Total left shoulder replacement. Hardware intact. Anatomic
alignment.

2. Soft tissue air noted over the upper extremity, this may be from
prior surgery. Clinical correlation suggested.

## 2021-05-23 ENCOUNTER — Ambulatory Visit (INDEPENDENT_AMBULATORY_CARE_PROVIDER_SITE_OTHER): Payer: Medicare Other | Admitting: Pulmonary Disease

## 2021-05-23 ENCOUNTER — Encounter: Payer: Self-pay | Admitting: Pulmonary Disease

## 2021-05-23 ENCOUNTER — Other Ambulatory Visit: Payer: Self-pay

## 2021-05-23 VITALS — BP 130/80 | HR 90 | Temp 98.3°F | Ht 62.0 in | Wt 198.0 lb

## 2021-05-23 DIAGNOSIS — G4733 Obstructive sleep apnea (adult) (pediatric): Secondary | ICD-10-CM

## 2021-05-23 DIAGNOSIS — Z9989 Dependence on other enabling machines and devices: Secondary | ICD-10-CM

## 2021-05-23 NOTE — Patient Instructions (Signed)
I will see you back in about 6 months  Trial with melatonin -3 to 5 mg should help  If melatonin is not helping then low-dose Klonopin may also help  Call with any other significant concerns  Continue using CPAP on a nightly basis

## 2021-05-23 NOTE — Progress Notes (Addendum)
@Patient  ID: , female    DOB: 02/01/1951, 70 y.o.   MRN: [id number]  Has been taken off her CPAP at night   [id number], MD  HPI: 70 year old female former smoker followed for obstructive sleep apnea Has been taking of CPAP at night She does not feel she has pressure issues or mask issues Uses CPAP nightly  TEST/EVENTS :  12/10/2019-sleep study-AHI 9.3 12/10/2019-titration study recommended trial of CPAP 11 cm H2O  05/23/2021 Follow up : OSA  Has been trying to use CPAP on a nightly basis More rested Daytime sleepiness is better Health has been relatively well  Was previously on BiPAP  [facility]. Does not drive .   Requires XS full face mask.   Allergies  Allergen Reactions   Hydromorphone Hcl Rash   Adhesive [Tape]     Causes blistering   Hydrocodone-Acetaminophen Other (See Comments)    Makes face itch    Hydromorphone     Sleepy   Lotrel [Amlodipine Besy-Benazepril Hcl] Cough   Oxycontin [Oxycodone Hcl] Itching   Saccharin     Causes stomach pain    Tape     Blister    Amlodipine Besy-Benazepril Hcl Cough   Saccharin Other (See Comments)    Immunization History  Administered Date(s) Administered   Influenza, High Dose Seasonal PF 04/20/2018, 05/16/2019, 03/20/2020   Influenza, Quadrivalent, Recombinant, Inj, Pf 05/22/2017   Moderna Sars-Covid-2 Vaccination 10/14/2019, 11/11/2019, 07/12/2020   Pneumococcal Conjugate-13 01/28/2014   Pneumococcal Polysaccharide-23 04/16/2017   Tdap 02/23/2009, 08/20/2016   Zoster, Live 08/21/2015    Past Medical History:  Diagnosis Date   Alpha (0) thalassemia (HCC)    Minor   Alpha thalassemia minor    Arthritis    Asthma    Complication of anesthesia    Combative   Depression    Diabetes mellitus without complication (HCC)    Diabetes mellitus without complication (HCC)    type 2    Dysrhythmia    Familial Mediterranean fever (HCC)    Headache    History of hiatal hernia     Hypertension    Hypothyroidism    Pneumonia    PONV (postoperative nausea and vomiting) 1979   Sleep apnea    Sleep apnea    No CPOP    Tobacco History: Social History   Tobacco Use  Smoking Status Former   Packs/day: 1.00   Years: 40.00   Pack years: 40.00   Types: Cigarettes   Quit date: 1978   Years since quitting: 44.7  Smokeless Tobacco Never  Tobacco Comments   2015   Counseling given: Not Answered Tobacco comments: 2015   Outpatient Medications Prior to Visit  Medication Sig Dispense Refill   albuterol (VENTOLIN HFA) 108 (90 Base) MCG/ACT inhaler Inhale 2 puffs into the lungs every 6 (six) hours as needed for wheezing or shortness of breath.     Aspirin Buf,CaCarb-MgCarb-MgO, 81 MG TABS Take 81 mg by mouth daily.     atorvastatin (LIPITOR) 20 MG tablet Take 20 mg by mouth every evening.     benzonatate (TESSALON) 100 MG capsule Take 1 capsule (100 mg total) by mouth every 8 (eight) hours. 21 capsule 0   buPROPion (WELLBUTRIN XL) 300 MG 24 hr tablet Take 300 mg by mouth daily.     calcium-vitamin D (OSCAL WITH D) 500-200 MG-UNIT tablet Take 1 tablet by mouth 2 (two) times a day.     Cholecalciferol (VITAMIN D3) 125 MCG (5000  UT) TABS Take 5,000 Units by mouth every evening.     Coenzyme Q10 (CO Q-10 PO) Take 1 capsule by mouth daily.     colchicine 0.6 MG tablet Take 0.6 mg by mouth 2 (two) times daily.     fluticasone (FLONASE) 50 MCG/ACT nasal spray Place 2 sprays into both nostrils at bedtime.     insulin lispro (HUMALOG) 100 UNIT/ML KwikPen Inject 1-10 Units into the skin 3 (three) times daily with meals. Sliding scale     ipratropium (ATROVENT) 0.02 % nebulizer solution Take 2.5 mLs (0.5 mg total) by nebulization 4 (four) times daily. 75 mL 0   lactobacillus acidophilus (BACID) TABS tablet Take 1 tablet by mouth daily.     lamoTRIgine (LAMICTAL) 150 MG tablet Take 150 mg by mouth at bedtime.     LEVEMIR 100 UNIT/ML injection Inject into the skin.      levothyroxine (SYNTHROID) 50 MCG tablet Take 50 mcg by mouth daily before breakfast.     losartan-hydrochlorothiazide (HYZAAR) 100-25 MG tablet Take 1 tablet by mouth daily.     metFORMIN (GLUCOPHAGE-XR) 500 MG 24 hr tablet Take 500 mg by mouth 2 (two) times daily with a meal.      Multiple Vitamin (MULTIVITAMIN WITH MINERALS) TABS tablet Take 1 tablet by mouth daily.     nortriptyline (PAMELOR) 50 MG capsule Take 50 mg by mouth at bedtime.     ondansetron (ZOFRAN ODT) 4 MG disintegrating tablet Take 1 tablet (4 mg total) by mouth every 8 (eight) hours as needed for nausea or vomiting. 5 tablet 0   pantoprazole (PROTONIX) 20 MG tablet Take 20 mg by mouth daily.     pregabalin (LYRICA) 150 MG capsule Take 150 mg by mouth 2 (two) times a day.     traZODone (DESYREL) 50 MG tablet Take 50 mg by mouth at bedtime.     UNABLE TO FIND Take 0.5 drops by mouth daily as needed. Med Name: CBD Oil- half a dropper  daily as needed     venlafaxine XR (EFFEXOR-XR) 75 MG 24 hr capsule Take 225 mg by mouth daily with breakfast.     No facility-administered medications prior to visit.     Review of Sytems:  Review of systems unremarkable Denies any GI symptoms Denies any ongoing musculoskeletal pain or discomfort Wheelchair-bound   Physical Exam  Middle-aged lady does not appear to be in distress Wheelchair-borne Moist oral mucosa Clear breath sounds S1-S2 appreciated Right AKA  BP 130/80 (BP Location: Right Arm, Cuff Size: Large)   Pulse 90   Temp 98.3 F (36.8 C) (Oral)   Ht 5\' 2"  (1.575 m)   Wt 198 lb (89.8 kg)   SpO2 96%   BMI 36.21 kg/m   Sleep study was 01/17/2020 -Titrated CPAP of 11  Assessment & Plan:   Obstructive sleep apnea -On CPAP therapy -Has been taking CPAP off occasionally at night  She does not feel like the pressure is inadequate Denies any significant mask issues  Trial with melatonin 3 to 5 mg at night  Trial with low-dose Klonopin may also be an option if  melatonin does not help  Patient encouraged to call with any significant concerns, she is to call to let 01/19/2020 know if melatonin is not really helping  Tentative follow-up in 6 months    A , MD 05/23/2021  Addendum: Compliance data reviewed showing 100% compliance Average use of 5 hours 44 minutes CPAP of 11 Residual AHI of 4.1  Does  have occasional leaks

## 2021-09-26 ENCOUNTER — Emergency Department (HOSPITAL_COMMUNITY): Payer: Medicare Other

## 2021-09-26 ENCOUNTER — Emergency Department (HOSPITAL_COMMUNITY)
Admission: EM | Admit: 2021-09-26 | Discharge: 2021-09-27 | Disposition: A | Discharge status: Home / Self Care | Payer: Medicare Other | Attending: Emergency Medicine | Admitting: Emergency Medicine

## 2021-09-26 DIAGNOSIS — E119 Type 2 diabetes mellitus without complications: Secondary | ICD-10-CM | POA: Insufficient documentation

## 2021-09-26 DIAGNOSIS — M25511 Pain in right shoulder: Secondary | ICD-10-CM | POA: Insufficient documentation

## 2021-09-26 DIAGNOSIS — R0789 Other chest pain: Secondary | ICD-10-CM | POA: Diagnosis present

## 2021-09-26 DIAGNOSIS — I10 Essential (primary) hypertension: Secondary | ICD-10-CM | POA: Diagnosis not present

## 2021-09-26 DIAGNOSIS — E039 Hypothyroidism, unspecified: Secondary | ICD-10-CM | POA: Diagnosis not present

## 2021-09-26 DIAGNOSIS — J45909 Unspecified asthma, uncomplicated: Secondary | ICD-10-CM | POA: Insufficient documentation

## 2021-09-26 DIAGNOSIS — Z794 Long term (current) use of insulin: Secondary | ICD-10-CM | POA: Insufficient documentation

## 2021-09-26 DIAGNOSIS — Z79899 Other long term (current) drug therapy: Secondary | ICD-10-CM | POA: Insufficient documentation

## 2021-09-26 DIAGNOSIS — Z7951 Long term (current) use of inhaled steroids: Secondary | ICD-10-CM | POA: Insufficient documentation

## 2021-09-26 DIAGNOSIS — Z7984 Long term (current) use of oral hypoglycemic drugs: Secondary | ICD-10-CM | POA: Diagnosis not present

## 2021-09-26 LAB — BASIC METABOLIC PANEL
Anion gap: 9 (ref 5–15)
BUN: 7 mg/dL — ABNORMAL LOW (ref 8–23)
CO2: 27 mmol/L (ref 22–32)
Calcium: 9.7 mg/dL (ref 8.9–10.3)
Chloride: 99 mmol/L (ref 98–111)
Creatinine, Ser: 0.58 mg/dL (ref 0.44–1.00)
GFR, Estimated: >60 mL/min (ref >60)
Glucose, Bld: 191 mg/dL — ABNORMAL HIGH (ref 70–99)
Potassium: 3.6 mmol/L (ref 3.5–5.1)
Sodium: 135 mmol/L (ref 135–145)

## 2021-09-26 LAB — CBC
HCT: 37.2 % (ref 36.0–46.0)
Hemoglobin: 12.8 g/dL (ref 12.0–15.0)
MCH: 28.1 pg (ref 26.0–34.0)
MCHC: 34.4 g/dL (ref 30.0–36.0)
MCV: 81.8 fL (ref 80.0–100.0)
Platelets: 169 10*3/uL (ref 150–400)
RBC: 4.55 MIL/uL (ref 3.87–5.11)
RDW: 15 % (ref 11.5–15.5)
WBC: 6.3 10*3/uL (ref 4.0–10.5)
nRBC: 0 % (ref 0.0–0.2)

## 2021-09-26 LAB — TROPONIN I (HIGH SENSITIVITY)
Troponin I (High Sensitivity): 9 ng/L (ref <18)
Troponin I (High Sensitivity): 10 ng/L (ref <18)

## 2021-09-26 MED ORDER — IOHEXOL 350 MG/ML SOLN
71.0000 mL | Freq: Once | INTRAVENOUS | Status: AC | PRN
  Administered 2021-09-26: 71 mL via INTRAVENOUS

## 2021-09-26 NOTE — ED Provider Triage Note (Signed)
Emergency Medicine Provider Triage Evaluation Note  Shirley Mcdonald , a 71 y.o. female  was evaluated in triage.  Pt complains of chest pain and left shoulder pain starting this morning patient states that she was trying to put on her necklace when she reached her left shoulder she started having pain there that radiated to her chest and across her chest.  She states that this also radiates to her back.  She has never had symptoms like this before.  She also has associated fatigue and shortness of breath.  No recent illness or injury.  Review of Systems  Positive: Chest pain, shortness of breath, fatigue Negative: Nausea, vomiting, abdominal pain  Physical Exam  BP (!) 164/95 (BP Location: Left Arm)    Pulse (!) 105    Temp 98.1 F (36.7 C) (Oral)    Resp 16    SpO2 97%  Gen:   Awake, no distress   Resp:  Normal effort  MSK:   Moves extremities without difficulty  Other:    Medical Decision Making  Medically screening exam initiated at 11:11 AM.  Appropriate orders placed.  Chandell Attridge was informed that the remainder of the evaluation will be completed by another provider, this initial triage assessment does not replace that evaluation, and the importance of remaining in the ED until their evaluation is complete.     ,  T, PA-C 09/26/21 1127

## 2021-09-26 NOTE — ED Triage Notes (Signed)
EMS stated, pt went to put on necklace and she had shoulder pain , then she started having chest pain .

## 2021-09-27 ENCOUNTER — Other Ambulatory Visit: Payer: Self-pay

## 2021-09-27 ENCOUNTER — Encounter (HOSPITAL_COMMUNITY): Payer: Self-pay

## 2021-09-27 MED ORDER — KETOROLAC TROMETHAMINE 30 MG/ML IJ SOLN
30.0000 mg | Freq: Once | INTRAMUSCULAR | Status: AC
  Administered 2021-09-27: 30 mg via INTRAMUSCULAR
  Filled 2021-09-27: qty 1

## 2021-09-27 MED ORDER — CYCLOBENZAPRINE HCL 10 MG PO TABS
5.0000 mg | ORAL_TABLET | Freq: Once | ORAL | Status: AC
  Administered 2021-09-27: 5 mg via ORAL
  Filled 2021-09-27: qty 1

## 2021-09-27 NOTE — Discharge Instructions (Signed)
You were seen today for chest pain.  Your work-up today is reassuring including your heart testing.  Take scheduled Aleve twice daily for the next 2 to 3 days.  Follow-up with your primary physician and schedule an appointment for cardiology for outpatient evaluation.

## 2021-09-27 NOTE — ED Notes (Signed)
Called [facility] to transport patient back to [address].

## 2021-09-27 NOTE — ED Provider Notes (Signed)
[facility]   CSN: [id number] Arrival date & time: 09/26/21  1052     History  Chief Complaint  Patient presents with   Chest Pain   Shoulder Pain    Shirley BoastBeverly Mcdonald is a 71 y.o. female.  HPI     This is a 71 year old female with history of diabetes, hypertension, hyperlipidemia, asthma who presents with chest discomfort.  Patient reports that yesterday morning she went to put on her medical alert bracelet.  She was putting it over her head when her right shoulder "locked up."  She was able to bring it back to a neutral position.  However, later in the morning, she was assisting an elderly man with getting dressed when she had shooting pain from her right shoulder across her chest.  She states that the chest pain is lingered all day.  Intensity comes and goes but it has not stopped.  At worst it is "knives" and at best it is "constant achiness."  Patient took 2 Tylenol, Aleve, and a CBD gummy with no relief.  Denies any fevers or shortness of breath.  No diaphoresis.  No known history of coronary artery disease.  It is worse with certain positions.  Home Medications Prior to Admission medications   Medication Sig Start Date End Date Taking? Authorizing Provider  albuterol (VENTOLIN HFA) 108 (90 Base) MCG/ACT inhaler Inhale 2 puffs into the lungs every 6 (six) hours as needed for wheezing or shortness of breath.   Yes [provider]  aspirin EC 81 MG tablet Take 81 mg by mouth daily. Swallow whole.   Yes [provider]  Aspirin Effervescent (ALKA-SELTZER PO) Take 2-3 tablets by mouth daily as needed (stomach pain).   Yes [provider]  atorvastatin (LIPITOR) 20 MG tablet Take 20 mg by mouth every evening. 06/24/16  Yes [provider]  B Complex Vitamins (VITAMIN B COMPLEX PO) Take 1 tablet by mouth at bedtime.   Yes [provider]  buPROPion (WELLBUTRIN XL) 300 MG 24 hr tablet Take 300  mg by mouth daily. 08/17/16  Yes [provider]  Calcium-Magnesium-Zinc (CAL-MAG-ZINC PO) Take 1 tablet by mouth 3 (three) times daily.   Yes [provider]  Cholecalciferol (VITAMIN D3) 125 MCG (5000 UT) TABS Take 5,000 Units by mouth every evening.   Yes [provider]  colchicine 0.6 MG tablet Take 0.6 mg by mouth 2 (two) times daily.   Yes [provider]  docusate sodium (COLACE) 250 MG capsule Take 250 mg by mouth at bedtime.   Yes [provider]  famotidine (PEPCID) 20 MG tablet Take 20 mg by mouth 2 (two) times daily. 09/12/21  Yes [provider]  insulin lispro (HUMALOG) 100 UNIT/ML KwikPen Inject into the skin 3 (three) times daily with meals. Sliding scale 08/15/16  Yes [provider]  lactobacillus acidophilus (BACID) TABS tablet Take 1 tablet by mouth daily.   Yes [provider]  lamoTRIgine (LAMICTAL) 150 MG tablet Take 150 mg by mouth at bedtime.   Yes [provider]  LEVEMIR 100 UNIT/ML injection Inject 12 Units into the skin at bedtime. 02/21/21  Yes [provider]  levothyroxine (SYNTHROID) 50 MCG tablet Take 50 mcg by mouth daily before breakfast.   Yes [provider]  losartan-hydrochlorothiazide (HYZAAR) 100-25 MG tablet Take 1 tablet by mouth daily. 07/23/17  Yes [provider]  melatonin 5 MG TABS Take 5 mg by mouth at bedtime.  Yes [provider]  metFORMIN (GLUCOPHAGE-XR) 500 MG 24 hr tablet Take 500 mg by mouth 2 (two) times daily with a meal.  07/25/16  Yes [provider]  Multiple Vitamin (MULTIVITAMIN WITH MINERALS) TABS tablet Take 1 tablet by mouth every evening.   Yes [provider]  naproxen sodium (ALEVE) 220 MG tablet Take 220 mg by mouth in the morning and at bedtime.   Yes [provider]  nortriptyline (PAMELOR) 50 MG capsule Take 50 mg by mouth at bedtime.   Yes [provider]  ondansetron (ZOFRAN  ODT) 4 MG disintegrating tablet Take 1 tablet (4 mg total) by mouth every 8 (eight) hours as needed for nausea or vomiting. 01/05/20  Yes Khatri, Hina, PA-C  pantoprazole (PROTONIX) 20 MG tablet Take 20 mg by mouth at bedtime.   Yes [provider]  traZODone (DESYREL) 100 MG tablet Take 100 mg by mouth at bedtime. 07/06/19  Yes [provider]  UNABLE TO FIND Take 10 mg by mouth in the morning and at bedtime. CBD GUMMY   Yes [provider]  vitamin C (ASCORBIC ACID) 500 MG tablet Take 1,000 mg by mouth every evening.   Yes [provider]  benzonatate (TESSALON) 100 MG capsule Take 1 capsule (100 mg total) by mouth every 8 (eight) hours. Patient not taking: Reported on 09/27/2021 09/10/20   Palumbo, April, MD  ipratropium (ATROVENT) 0.02 % nebulizer solution Take 2.5 mLs (0.5 mg total) by nebulization 4 (four) times daily. Patient not taking: Reported on 09/27/2021 09/10/20   Palumbo, April, MD      Allergies    Hydromorphone hcl, Adhesive [tape], Hydrocodone-acetaminophen, Hydromorphone, Lotrel [amlodipine besy-benazepril hcl], Oxycontin [oxycodone hcl], Saccharin, Tape, Amlodipine besy-benazepril hcl, and Saccharin    Review of Systems   Review of Systems  Constitutional:  Negative for fever.  Respiratory:  Negative for shortness of breath.   Cardiovascular:  Positive for chest pain. Negative for leg swelling.  Gastrointestinal:  Negative for abdominal pain.  All other systems reviewed and are negative.  Physical Exam Updated Vital Signs BP (!) 153/119    Pulse (!) 107    Temp 98.4 F (36.9 C) (Oral)    Resp (!) 23    SpO2 98%  Physical Exam Vitals and nursing note reviewed.  Constitutional:      Appearance: She is well-developed. She is obese. She is not ill-appearing.  HENT:     Head: Normocephalic and atraumatic.  Eyes:     Pupils: Pupils are equal, round, and reactive to light.  Cardiovascular:     Rate and Rhythm: Normal rate and regular  rhythm.     Heart sounds: Normal heart sounds.  Pulmonary:     Effort: Pulmonary effort is normal. No respiratory distress.     Breath sounds: No wheezing.  Chest:     Chest wall: No tenderness.  Abdominal:     Palpations: Abdomen is soft.  Musculoskeletal:     Cervical back: Neck supple.     Comments: Right leg amputated  Skin:    General: Skin is warm and dry.  Neurological:     Mental Status: She is alert and oriented to person, place, and time.  Psychiatric:        Mood and Affect: Mood normal.    ED Results / Procedures / Treatments   Labs (all labs ordered are listed, but only abnormal results are displayed) Labs Reviewed  BASIC METABOLIC PANEL - Abnormal; Notable for the following  components:      Result Value   Glucose, Bld 191 (*)    BUN 7 (*)    All other components within normal limits  CBC  TROPONIN I (HIGH SENSITIVITY)  TROPONIN I (HIGH SENSITIVITY)    EKG EKG Interpretation  Date/Time:  Tuesday September 26 2021 20:14:38 EST Ventricular Rate:  109 PR Interval:  208 QRS Duration: 110 QT Interval:  332 QTC Calculation: 447 R Axis:   6 Text Interpretation: Sinus tachycardia Nonspecific T wave abnormality Abnormal ECG When compared with ECG of 10-Sep-2020 18:33, PREVIOUS ECG IS PRESENT Confirmed by Ross Marcus ([id number]) on 09/27/2021 12:23:15 AM  Radiology DG Chest 2 View  Result Date: 09/26/2021 CLINICAL DATA:  Chest pain EXAM: CHEST - 2 VIEW COMPARISON:  Chest x-ray dated September 10, 2020 FINDINGS: Cardiac and mediastinal contours are unchanged. Mild bibasilar atelectasis. Lungs otherwise clear. No large pleural effusion or pneumothorax. Prior right total shoulder arthroplasty. IMPRESSION: No active cardiopulmonary disease. Electronically Signed   By: Allegra Lai M.D.   On: 09/26/2021 11:40   CT Angio Chest PE W and/or Wo Contrast  Result Date: 09/26/2021 CLINICAL DATA:  Chest pain, back pain. Pulmonary embolism (PE) suspected, high prob EXAM: CT  ANGIOGRAPHY CHEST WITH CONTRAST TECHNIQUE: Multidetector CT imaging of the chest was performed using the standard protocol during bolus administration of intravenous contrast. Multiplanar CT image reconstructions and MIPs were obtained to evaluate the vascular anatomy. RADIATION DOSE REDUCTION: This exam was performed according to the departmental dose-optimization program which includes automated exposure control, adjustment of the mA and/or kV according to patient size and/or use of iterative reconstruction technique. CONTRAST:  50mL OMNIPAQUE IOHEXOL 350 MG/ML SOLN COMPARISON:  None. FINDINGS: Cardiovascular: No filling defects in the pulmonary arteries to suggest pulmonary emboli. Heart is upper limits normal in size. Scattered aortic and coronary artery calcifications. No aneurysm. Mediastinum/Nodes: No mediastinal, hilar, or axillary adenopathy. Trachea and esophagus are unremarkable. Thyroid unremarkable. Lungs/Pleura: Lungs are clear. No focal airspace opacities or suspicious nodules. No effusions. Upper Abdomen: No acute findings Musculoskeletal: Chest wall soft tissues are unremarkable. No acute bony abnormality. Review of the MIP images confirms the above findings. IMPRESSION: No evidence of pulmonary embolus. No acute cardiopulmonary disease. Scattered coronary artery calcifications. Aortic Atherosclerosis (ICD10-I70.0). Electronically Signed   By: Charlett Nose M.D.   On: 09/26/2021 23:08    Procedures Procedures    Medications Ordered in ED Medications  iohexol (OMNIPAQUE) 350 MG/ML injection 71 mL (71 mLs Intravenous Contrast Given 09/26/21 2302)  ketorolac (TORADOL) 30 MG/ML injection 30 mg (30 mg Intramuscular Given 09/27/21 0054)  cyclobenzaprine (FLEXERIL) tablet 5 mg (5 mg Oral Given 09/27/21 0054)    ED Course/ Medical Decision Making/ A&P                           Medical Decision Making Risk Prescription drug management.   This patient presents to the ED for concern of chest  discomfort, this involves an extensive number of treatment options, and is a complaint that carries with it a high risk of complications and morbidity.  The differential diagnosis includes ACS, PE, musculoskeletal etiology, pneumonia, pneumothorax  MDM:    This is a 71 year old female who presents with chest pain.  Ongoing for the last day.  Reports that it seems to have originated from her right shoulder.  She reports her right shoulder locked up earlier this morning but she was able to get it mobilized.  Since  that time she has had waxing and waning chest pain across the anterior chest.  She is nontoxic and vital signs are notable for slight tachycardia.  She also had initial blood pressure 164/95.  She does have risk factors for ACS including diabetes, hypertension, hyperlipidemia.  EKG shows no evidence of acute ischemia or arrhythmia.  Troponin x2 negative and flat.  Otherwise basic lab work is reassuring.  She had a CT PE study from triage which shows no evidence of PE or pneumonia.  Pain is fairly atypical and worse with certain movements.  This in conjunction with relatively otherwise negative work-up, may be suggestive of musculoskeletal etiology.  She reports history of costochondritis.  She was given Toradol and Flexeril with some improvement of her symptoms.  Recommend that she take scheduled Aleve for the next 2 to 3 days.  Given her risk factors, would also recommend routine outpatient cardiology follow-up for evaluation for possible stress testing.  Patient is agreeable to plan. (Labs, imaging)  Labs: I Ordered, and personally interpreted labs.  The pertinent results include: Normal troponin x2, CBC, BMP baseline  Imaging Studies ordered: I ordered imaging studies including chest x-ray and CT PE study without evidence of PE I independently visualized and interpreted imaging. I agree with the radiologist interpretation  Additional history obtained from chart review.  External records  from outside source obtained and reviewed including prior visits  Critical Interventions: IM Toradol, Flexeril  Consultations: I requested consultation with the none,  and discussed lab and imaging findings as well as pertinent plan - they recommend: None  Cardiac Monitoring: The patient was maintained on a cardiac monitor.  I personally viewed and interpreted the cardiac monitored which showed an underlying rhythm of: Normal sinus rhythm  Reevaluation: After the interventions noted above, I reevaluated the patient and found that they have :improved   Considered admission for: Chest pain rule out  Social Determinants of Health: Wheelchair-bound  Disposition: Discharge  Co morbidities that complicate the patient evaluation  Past Medical History:  Diagnosis Date   Alpha (0) thalassemia (HCC)    Minor   Alpha thalassemia minor    Arthritis    Asthma    Complication of anesthesia    Combative   Depression    Diabetes mellitus without complication (HCC)    Diabetes mellitus without complication (HCC)    type 2    Dysrhythmia    Familial Mediterranean fever (HCC)    Headache    History of hiatal hernia    Hypertension    Hypothyroidism    Pneumonia    PONV (postoperative nausea and vomiting) 1979   Sleep apnea    Sleep apnea    No CPOP     Medicines Meds ordered this encounter  Medications   iohexol (OMNIPAQUE) 350 MG/ML injection 71 mL   ketorolac (TORADOL) 30 MG/ML injection 30 mg   cyclobenzaprine (FLEXERIL) tablet 5 mg    I have reviewed the patients home medicines and have made adjustments as needed  Problem List / ED Course: Problem List Items Addressed This Visit   None Visit Diagnoses     Atypical chest pain    -  Primary                   Final Clinical Impression(s) / ED Diagnoses Final diagnoses:  Atypical chest pain    Rx / DC Orders ED Discharge Orders     None         , Toni Amend  F, MD 09/27/21 [id number]

## 2022-06-12 ENCOUNTER — Other Ambulatory Visit: Payer: Self-pay | Admitting: Orthopaedic Surgery

## 2022-06-12 DIAGNOSIS — Z01818 Encounter for other preprocedural examination: Secondary | ICD-10-CM

## 2022-06-19 ENCOUNTER — Emergency Department (HOSPITAL_COMMUNITY)
Admission: EM | Admit: 2022-06-19 | Discharge: 2022-06-20 | Disposition: A | Discharge status: Home / Self Care | Payer: Medicare Other | Attending: Student | Admitting: Student

## 2022-06-19 ENCOUNTER — Other Ambulatory Visit: Payer: Self-pay

## 2022-06-19 ENCOUNTER — Encounter (HOSPITAL_COMMUNITY): Payer: Self-pay

## 2022-06-19 ENCOUNTER — Emergency Department (HOSPITAL_COMMUNITY)
Admission: RE | Admit: 2022-06-19 | Discharge: 2022-06-19 | Disposition: A | Discharge status: Home / Self Care | Payer: Medicare Other | Source: Ambulatory Visit | Attending: Orthopaedic Surgery | Admitting: Orthopaedic Surgery

## 2022-06-19 DIAGNOSIS — M79605 Pain in left leg: Secondary | ICD-10-CM | POA: Diagnosis not present

## 2022-06-19 DIAGNOSIS — Z7984 Long term (current) use of oral hypoglycemic drugs: Secondary | ICD-10-CM | POA: Insufficient documentation

## 2022-06-19 DIAGNOSIS — E119 Type 2 diabetes mellitus without complications: Secondary | ICD-10-CM | POA: Diagnosis not present

## 2022-06-19 DIAGNOSIS — Z794 Long term (current) use of insulin: Secondary | ICD-10-CM | POA: Diagnosis not present

## 2022-06-19 DIAGNOSIS — Z7982 Long term (current) use of aspirin: Secondary | ICD-10-CM | POA: Insufficient documentation

## 2022-06-19 DIAGNOSIS — M79672 Pain in left foot: Secondary | ICD-10-CM | POA: Diagnosis not present

## 2022-06-19 DIAGNOSIS — M7989 Other specified soft tissue disorders: Secondary | ICD-10-CM | POA: Diagnosis present

## 2022-06-19 DIAGNOSIS — Z89511 Acquired absence of right leg below knee: Secondary | ICD-10-CM | POA: Insufficient documentation

## 2022-06-19 DIAGNOSIS — Z01818 Encounter for other preprocedural examination: Secondary | ICD-10-CM | POA: Insufficient documentation

## 2022-06-19 LAB — CBC WITH DIFFERENTIAL/PLATELET
Abs Immature Granulocytes: 0.03 10*3/uL (ref 0.00–0.07)
Basophils Absolute: 0 10*3/uL (ref 0.0–0.1)
Basophils Relative: 1 %
Eosinophils Absolute: 0.3 10*3/uL (ref 0.0–0.5)
Eosinophils Relative: 5 %
HCT: 35.8 % — ABNORMAL LOW (ref 36.0–46.0)
Hemoglobin: 12 g/dL (ref 12.0–15.0)
Immature Granulocytes: 1 %
Lymphocytes Relative: 15 %
Lymphs Abs: 0.9 10*3/uL (ref 0.7–4.0)
MCH: 28.5 pg (ref 26.0–34.0)
MCHC: 33.5 g/dL (ref 30.0–36.0)
MCV: 85 fL (ref 80.0–100.0)
Monocytes Absolute: 0.4 10*3/uL (ref 0.1–1.0)
Monocytes Relative: 7 %
Neutro Abs: 4.1 10*3/uL (ref 1.7–7.7)
Neutrophils Relative %: 71 %
Platelets: 150 10*3/uL (ref 150–400)
RBC: 4.21 MIL/uL (ref 3.87–5.11)
RDW: 15.6 % — ABNORMAL HIGH (ref 11.5–15.5)
WBC: 5.8 10*3/uL (ref 4.0–10.5)
nRBC: 0 % (ref 0.0–0.2)

## 2022-06-19 LAB — BASIC METABOLIC PANEL
Anion gap: 9 (ref 5–15)
BUN: 10 mg/dL (ref 8–23)
CO2: 26 mmol/L (ref 22–32)
Calcium: 9.3 mg/dL (ref 8.9–10.3)
Chloride: 99 mmol/L (ref 98–111)
Creatinine, Ser: 0.63 mg/dL (ref 0.44–1.00)
GFR, Estimated: >60 mL/min (ref >60)
Glucose, Bld: 150 mg/dL — ABNORMAL HIGH (ref 70–99)
Potassium: 4.4 mmol/L (ref 3.5–5.1)
Sodium: 134 mmol/L — ABNORMAL LOW (ref 135–145)

## 2022-06-19 LAB — CBG MONITORING, ED: Glucose-Capillary: 84 mg/dL (ref 70–99)

## 2022-06-19 NOTE — ED Triage Notes (Signed)
Pt states she noticed swelling in her left foot yesterday. Pt states that she elevated foot overnight without a change. Pt send from PCP for eval. Foot feels about the same temp as rest of body- pt only has one leg

## 2022-06-19 NOTE — ED Provider Triage Note (Signed)
Emergency Medicine Provider Triage Evaluation Note  Penina Reisner , a 71 y.o. female  was evaluated in triage.  Pt complains of swelling and pain of the left lower extremity.  Started yesterday.  Thought that if she elevated her leg, it would improve by the next day.  Still swollen and tender, states "my foot/ankle looks swollen as when I was 150 pounds heavier".  Denies new sensation changes such as numbness and tingling.  Hx of right BKA.  Patient concerned about the blood flow to her leg.  Hx significant for DMT2, hypothyroidism, HTN, alpha thalassemia minor  Review of Systems  Positive:  Negative: See above  Physical Exam  BP 128/70 (BP Location: Right Arm)   Pulse 73   Temp 98.3 F (36.8 C) (Oral)   Resp 18   Ht 5\' 2"  (1.575 m)   Wt 88.5 kg   SpO2 98%   BMI 35.67 kg/m  Gen:   Awake, no distress   Resp:  Normal effort  MSK:   Moves extremities without difficulty  Other:  Handheld ultrasound utilized to identify PT 2+ and DP 1+.  Still able to wiggle toes, sensation appears grossly intact.  Mild erythema and swelling of the left lower leg.  Medical Decision Making  Medically screening exam initiated at 5:41 PM.  Appropriate orders placed.  Tiamarie Furnari was informed that the remainder of the evaluation will be completed by another provider, this initial triage assessment does not replace that evaluation, and the importance of remaining in the ED until their evaluation is complete.  Consulted with attending Dr. Doren Custard, who came and evaluated the patient.  Identified DP and PT pulse using handheld ultrasound.  Patient without evidence of acute ischemia.   Prince Rome, PA-C 08/07/74 2029

## 2022-06-19 NOTE — ED Notes (Signed)
This patient informed this EMT that her sugar was dropping and showed this EMT a personal CBG monitor reading 93. This EMT checked the patient's CBG to which the results are as noted. This EMT gave this patient apple juice, graham crackers and peanut butter in order to bring the patient's CBG levels back up.

## 2022-06-20 ENCOUNTER — Encounter (HOSPITAL_COMMUNITY): Payer: Medicare Other

## 2022-06-20 ENCOUNTER — Emergency Department (HOSPITAL_BASED_OUTPATIENT_CLINIC_OR_DEPARTMENT_OTHER): Payer: Medicare Other

## 2022-06-20 ENCOUNTER — Emergency Department (HOSPITAL_COMMUNITY): Payer: Medicare Other

## 2022-06-20 DIAGNOSIS — M79672 Pain in left foot: Secondary | ICD-10-CM

## 2022-06-20 DIAGNOSIS — R609 Edema, unspecified: Secondary | ICD-10-CM | POA: Diagnosis not present

## 2022-06-20 MED ORDER — FENTANYL CITRATE PF 50 MCG/ML IJ SOSY
50.0000 ug | PREFILLED_SYRINGE | Freq: Once | INTRAMUSCULAR | Status: AC
  Administered 2022-06-20: 50 ug via INTRAVENOUS
  Filled 2022-06-20: qty 1

## 2022-06-20 MED ORDER — IOHEXOL 350 MG/ML SOLN
100.0000 mL | Freq: Once | INTRAVENOUS | Status: AC | PRN
  Administered 2022-06-20: 100 mL via INTRAVENOUS

## 2022-06-20 NOTE — ED Provider Notes (Signed)
[facility] EMERGENCY DEPARTMENT Provider Note   CSN: [id number] Arrival date & time: 06/19/22  1540     History RIGHT BKA, DM, Asthma Chief Complaint  Patient presents with   Foot Problem    Shirley Mcdonald is a 71 y.o. female.  71 y.o female with a PMH of DM, Alpha thalassemia,Depression presents to the ED with a chief complaint of left leg swelling x yesterday. Patient reports she was at home when she noted her leg to be more swollen and painful. She tried to elevate her leg along with take tylenol, celebrex and CBD oil with no improvement in symptoms. 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  Patient does report her left foot feels somewhat cold.  No exacerbation in symptoms. No Fever, no chest pain, no shortness of breath.  The history is provided by the patient and medical records.       Home Medications Prior to Admission medications   Medication Sig Start Date End Date Taking? Authorizing Provider  albuterol (VENTOLIN HFA) 108 (90 Base) MCG/ACT inhaler Inhale 2 puffs into the lungs every 6 (six) hours as needed for wheezing or shortness of breath.    [provider]  aspirin EC 81 MG tablet Take 81 mg by mouth daily. Swallow whole.    [provider]  Aspirin Effervescent (ALKA-SELTZER PO) Take 2-3 tablets by mouth daily as needed (stomach pain).    [provider]  atorvastatin (LIPITOR) 20 MG tablet Take 20 mg by mouth every evening. 06/24/16   [provider]  B Complex Vitamins (VITAMIN B COMPLEX PO) Take 1 tablet by mouth at bedtime.    [provider]  benzonatate (TESSALON) 100 MG capsule Take 1 capsule (100 mg total) by mouth every 8 (eight) hours. Patient not taking: Reported on 09/27/2021 09/10/20   Palumbo, April, MD  buPROPion (WELLBUTRIN XL) 300 MG 24 hr tablet Take 300 mg by mouth daily. 08/17/16   [provider]  Calcium-Magnesium-Zinc (CAL-MAG-ZINC PO) Take  1 tablet by mouth 3 (three) times daily.    [provider]  Cholecalciferol (VITAMIN D3) 125 MCG (5000 UT) TABS Take 5,000 Units by mouth every evening.    [provider]  colchicine 0.6 MG tablet Take 0.6 mg by mouth 2 (two) times daily.    [provider]  docusate sodium (COLACE) 250 MG capsule Take 250 mg by mouth at bedtime.    [provider]  famotidine (PEPCID) 20 MG tablet Take 20 mg by mouth 2 (two) times daily. 09/12/21   [provider]  insulin lispro (HUMALOG) 100 UNIT/ML KwikPen Inject into the skin 3 (three) times daily with meals. Sliding scale 08/15/16   [provider]  ipratropium (ATROVENT) 0.02 % nebulizer solution Take 2.5 mLs (0.5 mg total) by nebulization 4 (four) times daily. Patient not taking: Reported on 09/27/2021 09/10/20   Palumbo, April, MD  lactobacillus acidophilus (BACID) TABS tablet Take 1 tablet by mouth daily.    [provider]  lamoTRIgine (LAMICTAL) 150 MG tablet Take 150 mg by mouth at bedtime.    [provider]  LEVEMIR 100 UNIT/ML injection Inject 12 Units into the skin at bedtime. 02/21/21   [provider]  levothyroxine (SYNTHROID) 50 MCG tablet Take 50 mcg by mouth daily before breakfast.    [provider]  losartan-hydrochlorothiazide (HYZAAR) 100-25 MG tablet Take 1 tablet by mouth daily. 07/23/17   [provider]  melatonin 5 MG TABS Take 5 mg by mouth at bedtime.    [provider]  metFORMIN (GLUCOPHAGE-XR) 500 MG 24 hr tablet Take 500 mg by mouth 2 (two) times daily with a meal.  07/25/16   [provider]  Multiple Vitamin (MULTIVITAMIN WITH MINERALS) TABS tablet Take 1 tablet by mouth every evening.    [provider]  naproxen sodium (ALEVE) 220 MG tablet Take 220 mg by mouth in the morning and at bedtime.    [provider]  nortriptyline (PAMELOR) 50 MG capsule Take 50 mg by mouth at bedtime.    [provider]  ondansetron (ZOFRAN ODT) 4 MG disintegrating tablet Take 1 tablet (4 mg total) by mouth every 8 (eight) hours as needed for nausea or vomiting. 01/05/20   Khatri, Hina, PA-C  pantoprazole (PROTONIX) 20 MG tablet Take 20 mg by mouth at bedtime.    [provider]  traZODone (DESYREL) 100 MG tablet Take 100 mg by mouth at bedtime. 07/06/19   [provider]  UNABLE TO FIND Take 10 mg by mouth in the morning and at bedtime. CBD GUMMY    [provider]  vitamin C (ASCORBIC ACID) 500 MG tablet Take 1,000 mg by mouth every evening.    [provider]      Allergies    Hydromorphone hcl, Adhesive [tape], Hydrocodone-acetaminophen, Hydromorphone, Lotrel [amlodipine besy-benazepril hcl], Oxycontin [oxycodone hcl], Saccharin, Tape, Amlodipine besy-benazepril hcl, and Saccharin    Review of Systems   Review of Systems  Constitutional:  Negative for chills and fever.  HENT:  Negative for sinus pressure.   Respiratory:  Negative for shortness of breath.   Cardiovascular:  Positive for leg swelling. Negative for chest pain.  Gastrointestinal:  Negative for abdominal pain, diarrhea, nausea and vomiting.  Genitourinary:  Negative for flank pain.  Musculoskeletal:  Negative for back pain and myalgias.  Skin:  Negative for pallor and wound.  Neurological:  Negative for light-headedness and headaches.  All other systems reviewed and are negative.   Physical Exam Updated Vital Signs BP (!) 134/92   Pulse 100   Temp 98.3 F (36.8 C) (Oral)   Resp 14   Ht 5\' 2"  (1.575 m)   Wt 88.5 kg   SpO2 93%   BMI 35.67 kg/m  Physical Exam Vitals and nursing note reviewed.  Constitutional:      Appearance: Normal appearance. She is not ill-appearing.  HENT:     Head: Normocephalic and atraumatic.     Nose: Nose normal.     Mouth/Throat:     Mouth: Mucous membranes are moist.  Cardiovascular:     Rate and Rhythm: Normal rate.     Pulses:           Dorsalis pedis pulses are detected w/ Doppler on the left side.       Posterior tibial pulses are detected w/ Doppler on the left side.  Pulmonary:     Effort: Pulmonary effort is normal.     Breath sounds: No wheezing.  Abdominal:     General: Abdomen is flat.     Palpations: Abdomen is soft.     Tenderness: There is no abdominal tenderness.  Musculoskeletal:     Cervical back: Normal range of motion and neck supple.     Right lower leg: No edema.     Left lower leg: No edema.     Right Lower Extremity: Right leg is amputated below knee.  Feet:     Left foot:     Skin integrity: Erythema present. No warmth.     Toenail Condition: Left toenails are abnormally thick.  Skin:    General: Skin is dry.     Coloration: Skin is not pale.     Findings: Erythema present.  Neurological:     Mental Status: She is alert and oriented to person, place, and time.     Sensory: Sensation is intact.     ED Results / Procedures / Treatments   Labs (all labs ordered are listed, but only abnormal results are displayed) Labs Reviewed  BASIC METABOLIC PANEL - Abnormal; Notable for the following components:      Result Value   Sodium 134 (*)    Glucose, Bld 150 (*)    All other components within normal limits  CBC WITH DIFFERENTIAL/PLATELET - Abnormal; Notable for the following components:   HCT 35.8 (*)    RDW 15.6 (*)    All other components within normal limits  CBG MONITORING, ED    EKG None  Radiology CT Angio Aortobifemoral W and/or Wo Contrast  Result Date: 06/20/2022 CLINICAL DATA:  71 year old female with a history of claudication/ischemia EXAM: CT ANGIOGRAPHY OF ABDOMINAL AORTA WITH ILIOFEMORAL RUNOFF TECHNIQUE: Multidetector CT imaging of the abdomen, pelvis and lower extremities was performed using the standard protocol during bolus administration of intravenous contrast. Multiplanar CT image reconstructions and MIPs were obtained to evaluate the vascular anatomy. RADIATION DOSE  REDUCTION: This exam was performed according to the departmental dose-optimization program which includes automated exposure control, adjustment of the mA and/or kV according to patient size and/or use of iterative reconstruction technique. CONTRAST:  OMNIPAQUE IOHEXOL 350 MG/ML SOLN COMPARISON:  01/05/2020 FINDINGS: VASCULAR Aorta: Minimal atherosclerotic changes of the lower thoracic aorta. Diameter at the hiatus estimated 2.5 cm. Mild atherosclerotic changes of the abdominal aorta. No pedunculated plaque, ulcerated plaque, dissection, or aneurysm. No arterial wall thickening or periaortic fluid. Celiac: Minimal atherosclerosis of the celiac artery origin. Branches are patent. SMA: Mild atherosclerosis of the SMA origin. No high-grade stenosis. Branches are patent. Renals: - Right: Single right renal artery. Mild atherosclerosis at the origin. No evidence of high-grade stenosis. No pre hilar branching. - Left: Single left renal artery. Mild atherosclerosis at the origin. No high-grade stenosis. No pre hilar branching. IMA: Inferior mesenteric artery is patent. Right lower extremity: Unremarkable course, caliber, and contour of the right iliac system. No aneurysm, dissection, or occlusion. Hypogastric artery is patent. Common femoral artery patent. The proximal SFA and profunda femoris are patent, though are attenuated in this patient with above knee amputation. Left lower extremity: Unremarkable course, caliber, and contour of the left iliac system. No aneurysm, dissection, or occlusion. Hypogastric artery is patent, with soft plaque at the origin contributing to estimated 50% narrowing at the origin. Pelvic arteries are patent. Common femoral artery patent. Profunda femoris and the thigh branches are patent. SFA is patent throughout its length with no significant atherosclerotic changes. Popliteal artery is patent above the knee and below the knee. The segment at the knee is somewhat obscured by streak  artifact from the knee arthroplasty. Given the overall appearance the segment behind the knee is favored to be patent. Typical trifurcation. Proximal anterior tibial artery patent. Tibioperoneal trunk is patent. Attenuated/hypoplastic peroneal artery which occludes proximally. No significant contrast within the distal posterior tibial artery and distal anterior tibial artery on the arterial phase. Late phase imaging demonstrates contrast filling the length of the  posterior tibial artery to the foot. Small caliber anterior tibial artery which is patent in the proximal and mid third. Attenuated contrast column at the ankle, may be secondary to small caliber versus occlusion distally. Veins: Unremarkable appearance of the venous system on the arterial phase imaging. Review of the MIP images confirms the above findings. NON-VASCULAR Lower chest: No acute. Hepatobiliary: Nodular contour of the liver surface. Enlarged caudate and left liver. Cholecystectomy Pancreas: Unremarkable. Spleen: Cranial caudal span of spleen greater than 16 cm. Adrenals/Urinary Tract: - Right adrenal gland: Unremarkable - Left adrenal gland: Unremarkable. - Right kidney: No hydronephrosis, nephrolithiasis, inflammation, or ureteral dilation. No focal lesion. - Left Kidney: No hydronephrosis, nephrolithiasis, inflammation, or ureteral dilation. No focal lesion. - Urinary Bladder: Bladder partially distended Stomach/Bowel: - Stomach: Small hiatal hernia.  Otherwise unremarkable stomach - Small bowel: Unremarkable - Appendix: Appendix is not visualized, however, no inflammatory changes are present adjacent to the cecum to indicate an appendicitis. - Colon: Moderate formed stool burden. No focal inflammatory changes. Lymphatic: No adenopathy. Mesenteric: No free fluid or air. No mesenteric adenopathy. Reproductive: Unremarkable uterus/adnexa. Other: Surgical changes of prior hernia repair with attenuation of the anterior abdominal wall musculature.  Edema/stranding of the fat within the pannus. No focal fluid. Nonspecific pelvic floor laxity. Musculoskeletal: Atrophic musculature of the proximal right lower extremity compatible with changes of BKA. Degenerative changes of the left greater than right hip. Degenerative changes of the visualized thoracolumbar spine. No acute displaced fracture. No aggressive lytic or sclerotic lesions identified. Similar appearance of L3 compression fracture. Surgical changes of left knee arthroplasty Nonspecific edema at the left ankle/foot. Degenerative changes of the foot. Surgical changes of the forefoot. All of this would be better characterized on plain film imaging if indicated. IMPRESSION: CT angiogram demonstrates inline patency of the aorta, left iliac system, left femoropopliteal system, and the left posterior tibial artery to the foot. There is unopacified left peroneal artery, and also decreased contrast within the distal left anterior tibial artery, which may be secondary to late arrival of the contrast secondary to small artery versus distal embolization. Mild multilevel PA D, including: -aortic atherosclerosis Aortic Atherosclerosis (ICD10-I70.0). 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 Additional ancillary findings as above. Signed, Yvone Neu. Miachel Roux, RPVI [facility] Radiology Electronically Signed   By: Gilmer Mor D.O.   On: 06/20/2022 09:17    Procedures Procedures    Medications Ordered in ED Medications  fentaNYL (SUBLIMAZE) injection 50 mcg (50 mcg Intravenous Given 06/20/22 0830)  iohexol  (OMNIPAQUE) 350 MG/ML injection 100 mL (100 mLs Intravenous Contrast Given 06/20/22 4098)    ED Course/ Medical Decision Making/ A&P                           Medical Decision Making Risk Prescription drug management.    This patient presents to the ED for concern of left leg pain, this involves a number of treatment options, and is a complaint that carries with it a high risk of complications and morbidity.  The differential diagnosis includes cellulitis, DVT versus ischemia.   Co morbidities: Discussed in HPI   Brief History:  Patient with prior right BKA  presents to the ED with left leg pain and which began earlier yesterday.  Taken her pain regimen of Tylenol, Celebrex, CBD oil with no improvement in symptoms.  Denies any fevers.  EMR reviewed including pt PMHx, past surgical history and past visits to ER.   See HPI for more details   Lab Tests:  I ordered and independently interpreted labs.  The pertinent results include:    I personally reviewed all laboratory work and imaging. Metabolic panel without any acute abnormality specifically kidney function within normal limits and no significant electrolyte abnormalities. CBC without leukocytosis or significant anemia.   Imaging Studies:  CT Angio showed: CT angiogram demonstrates inline patency of the aorta, left iliac  system, left femoropopliteal system, and the left posterior tibial  artery to the foot. There is unopacified left peroneal artery, and  also decreased contrast within the distal left anterior tibial  artery, which may be secondary to late arrival of the contrast  secondary to small artery versus distal embolization.   This study was reviewed with Dr. Chestine Spore who reports no acute occlusion.   DVT study negative for a thrombus.   Medicines ordered:  I ordered medication including fentanyl  for pain control  Reevaluation of the patient after these medicines showed that the patient improved I have  reviewed the patients home medicines and have made adjustments as needed  Consults:  10:22 AM I requested consultation with Dr. Alger Simons vascular surgery  and discussed lab and imaging findings as well as pertinent plan - they do not recommend any acute intervention at this time.   Reevaluation:  After the interventions noted above I re-evaluated patient and found that they have :improved   Social Determinants of Health:  The patient's social determinants of health were a factor in the care of this patient    Problem List / ED Course:  Patient here with left leg pain for the past day and a half, no improvement despite her at home pain treatment including Tylenol, Celebrex, CBD oil.  Labs are benign on arrival.  Patient's exam performed after 16:30 hours, remarkable for a cold left foot with diminished pulses not palpable, but present with Doppler.  CT angio order in triage to rule out ischemia, however unable to perform this due to patient not having any IV access. Patient arrived to the room, given 50 of fentanyl to help with pain control she reports her pain is an 8 out of 10.  CT angio continues to be pending.  Bruising noted to patient's left shin, she does report bumping her leg, no other trauma, her compartments are soft.  No streaking in the skin, no redness or open wounds to suggest cellulitis. Patient evaluated by vascular at the bedside, they report source likely not arterial.  DVT study was ordered which did not show any acute signs of thrombus.  Patient given fentanyl for pain control.  I discussed with her outpatient follow-up with primary care physician.  Patient is agreeable to plan and treatment at this time.  Patient hemodynamically stable for discharge. Dispostion:  After consideration of the diagnostic results and the patients response to treatment, I feel that the patent would benefit from outpatient follow up.    Portions of this note were generated with Administrator, sports. Dictation errors may occur despite best attempts at proofreading.   Final Clinical Impression(s) / ED Diagnoses Final diagnoses:  Left leg pain    Rx / DC Orders ED Discharge Orders  None         Claude MangesSoto, , PA-C 06/20/22 1233    Margarita Grizzleay, Danielle, MD 06/20/22 1550

## 2022-06-20 NOTE — Progress Notes (Signed)
LLE venous duplex has been completed.  Preliminary results given to Dr. Jeanell Sparrow and Allyn Kenner, PA-C.   Results can be found under chart review under CV PROC. 06/20/2022 1:07 PM  RVT, RDMS

## 2022-06-20 NOTE — Consult Note (Signed)
Hospital Consult    Reason for Consult: Left foot pain with concern for ischemia/arterial insufficiency Referring Physician: ED MRN #:  [id number]  History of Present Illness: This is a 71 y.o. female with history of diabetes, familial Mediterranean fever, hypertension, hypothyroidism that vascular surgery has been consulted for left foot pain with concern for arterial insufficiency and ischemia.  Patient states she presented to the ED because she noticed swelling in her left leg over the last several days.  She has no pain in the foot.  Her foot is motor and sensory intact.  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  No previous vascular interventions.  Spends most of her time in a wheelchair.  Past Medical History:  Diagnosis Date   Alpha (0) thalassemia (HCC)    Minor   Alpha thalassemia minor    Arthritis    Asthma    Complication of anesthesia    Combative   Depression    Diabetes mellitus without complication (HCC)    Diabetes mellitus without complication (HCC)    type 2    Dysrhythmia    Familial Mediterranean fever (HCC)    Headache    History of hiatal hernia    Hypertension    Hypothyroidism    Pneumonia    PONV (postoperative nausea and vomiting) 1979   Sleep apnea    Sleep apnea    No CPOP    Past Surgical History:  Procedure Laterality Date   APPENDECTOMY     ARTHROSCOPY KNEE W/ DRILLING     CESAREAN SECTION     x2   CESAREAN SECTION  1941,7408   CHOLECYSTECTOMY     CHOLECYSTECTOMY  1975   CYST EXCISION Left    arm   EXPLORATORY LAPAROTOMY  1985   HERNIA REPAIR     x3   HERNIA REPAIR     3 hernias   KNEE SURGERY Left 1900   LAPAROTOMY  1985   LEG AMPUTATION     AKA   LEG AMPUTATION Right 2012   REPLACEMENT TOTAL KNEE Bilateral 05/2004   REVERSE SHOULDER ARTHROPLASTY Left 02/11/2019   Procedure: REVERSE SHOULDER ARTHROPLASTY;  Surgeon: Bjorn Pippin, MD;  Location: [facility];  Service:  Orthopedics;  Laterality: Left;   THIGH FASCIOTOMY Right    TONSILLECTOMY     TOTAL SHOULDER ARTHROPLASTY Right 02/11/2019   ULNAR NERVE TRANSPOSITION Left 08/12/2019   Procedure: LEFT ELBOW ULNAR NERVE DISCOMPRESSION AND TRANSPOSITION;  Surgeon: Bjorn Pippin, MD;  Location: [facility];  Service: Orthopedics;  Laterality: Left;    Allergies  Allergen Reactions   Hydromorphone Hcl Rash   Adhesive [Tape]     Causes blistering   Hydrocodone-Acetaminophen Other (See Comments)    Makes face itch    Hydromorphone     Sleepy   Lotrel [Amlodipine Besy-Benazepril Hcl] Cough   Oxycontin [Oxycodone Hcl] Itching   Saccharin     Causes stomach pain    Tape     Blister    Amlodipine Besy-Benazepril Hcl Cough   Saccharin Other (See Comments)    Prior to Admission medications   Medication Sig Start Date End Date Taking? Authorizing Provider  albuterol (VENTOLIN HFA) 108 (90 Base) MCG/ACT inhaler Inhale 2 puffs into the lungs every 6 (six) hours as needed for wheezing or shortness of breath.    [provider]  aspirin EC 81 MG tablet Take 81 mg by mouth daily. Swallow  whole.    [provider]  Aspirin Effervescent (ALKA-SELTZER PO) Take 2-3 tablets by mouth daily as needed (stomach pain).    [provider]  atorvastatin (LIPITOR) 20 MG tablet Take 20 mg by mouth every evening. 06/24/16   [provider]  B Complex Vitamins (VITAMIN B COMPLEX PO) Take 1 tablet by mouth at bedtime.    [provider]  benzonatate (TESSALON) 100 MG capsule Take 1 capsule (100 mg total) by mouth every 8 (eight) hours. Patient not taking: Reported on 09/27/2021 09/10/20   Palumbo, April, MD  buPROPion (WELLBUTRIN XL) 300 MG 24 hr tablet Take 300 mg by mouth daily. 08/17/16   [provider]  Calcium-Magnesium-Zinc (CAL-MAG-ZINC PO) Take 1 tablet by mouth 3 (three) times daily.    [provider]  Cholecalciferol (VITAMIN D3) 125 MCG (5000 UT) TABS Take 5,000  Units by mouth every evening.    [provider]  colchicine 0.6 MG tablet Take 0.6 mg by mouth 2 (two) times daily.    [provider]  docusate sodium (COLACE) 250 MG capsule Take 250 mg by mouth at bedtime.    [provider]  famotidine (PEPCID) 20 MG tablet Take 20 mg by mouth 2 (two) times daily. 09/12/21   [provider]  insulin lispro (HUMALOG) 100 UNIT/ML KwikPen Inject into the skin 3 (three) times daily with meals. Sliding scale 08/15/16   [provider]  ipratropium (ATROVENT) 0.02 % nebulizer solution Take 2.5 mLs (0.5 mg total) by nebulization 4 (four) times daily. Patient not taking: Reported on 09/27/2021 09/10/20   Palumbo, April, MD  lactobacillus acidophilus (BACID) TABS tablet Take 1 tablet by mouth daily.    [provider]  lamoTRIgine (LAMICTAL) 150 MG tablet Take 150 mg by mouth at bedtime.    [provider]  LEVEMIR 100 UNIT/ML injection Inject 12 Units into the skin at bedtime. 02/21/21   [provider]  levothyroxine (SYNTHROID) 50 MCG tablet Take 50 mcg by mouth daily before breakfast.    [provider]  losartan-hydrochlorothiazide (HYZAAR) 100-25 MG tablet Take 1 tablet by mouth daily. 07/23/17   [provider]  melatonin 5 MG TABS Take 5 mg by mouth at bedtime.    [provider]  metFORMIN (GLUCOPHAGE-XR) 500 MG 24 hr tablet Take 500 mg by mouth 2 (two) times daily with a meal.  07/25/16   [provider]  Multiple Vitamin (MULTIVITAMIN WITH MINERALS) TABS tablet Take 1 tablet by mouth every evening.    [provider]  naproxen sodium (ALEVE) 220 MG tablet Take 220 mg by mouth in the morning and at bedtime.    [provider]  nortriptyline (PAMELOR) 50 MG capsule Take 50 mg by mouth at bedtime.    [provider]  ondansetron (ZOFRAN ODT) 4 MG disintegrating tablet Take 1 tablet (4 mg total) by mouth every 8 (eight) hours as needed  for nausea or vomiting. 01/05/20   Khatri, Hina, PA-C  pantoprazole (PROTONIX) 20 MG tablet Take 20 mg by mouth at bedtime.    [provider]  traZODone (DESYREL) 100 MG tablet Take 100 mg by mouth at bedtime. 07/06/19   [provider]  UNABLE TO FIND Take 10 mg by mouth in the morning and at bedtime. CBD GUMMY    [provider]  vitamin C (ASCORBIC ACID) 500 MG tablet Take 1,000 mg by mouth every evening.    [provider]    Social  History   Socioeconomic History   Marital status: Widowed    Spouse name: Not on file   Number of children: Not on file   Years of education: Not on file   Highest education level: Not on file  Occupational History   Not on file  Tobacco Use   Smoking status: Former    Packs/day: 1.00    Years: 40.00    Total pack years: 40.00    Types: Cigarettes    Quit date: [id number]    Years since quitting: 45.8   Smokeless tobacco: Never   Tobacco comments:    2015  Vaping Use   Vaping Use: Never used  Substance and Sexual Activity   Alcohol use: Not Currently   Drug use: Never   Sexual activity: Not on file  Other Topics Concern   Not on file  Social History Narrative   ** Merged History Encounter **       Social Determinants of Health   Financial Resource Strain: Not on file  Food Insecurity: Not on file  Transportation Needs: Not on file  Physical Activity: Not on file  Stress: Not on file  Social Connections: Not on file  Intimate Partner Violence: Not on file     History reviewed. No pertinent family history.  ROS: [x]  Positive   [ ]  Negative   [ ]  All sytems reviewed and are negative  Cardiovascular: []  chest pain/pressure []  palpitations []  SOB lying flat []  DOE []  pain in legs while walking []  pain in legs at rest []  pain in legs at night []  non-healing ulcers []  hx of DVT [x]  swelling in legs - left leg  Pulmonary: []  productive cough []  asthma/wheezing []  home O2  Neurologic: []   weakness in []  arms []  legs []  numbness in []  arms []  legs []  hx of CVA []  mini stroke [] difficulty speaking or slurred speech []  temporary loss of vision in one eye []  dizziness  Hematologic: []  hx of cancer []  bleeding problems []  problems with blood clotting easily  Endocrine:   []  diabetes []  thyroid disease  GI []  vomiting blood []  blood in stool  GU: []  CKD/renal failure []  HD--[]  M/W/F or []  T/T/S []  burning with urination []  blood in urine  Psychiatric: []  anxiety []  depression  Musculoskeletal: []  arthritis []  joint pain  Integumentary: []  rashes []  ulcers  Constitutional: []  fever []  chills   Physical Examination  Vitals:   06/20/22 1030 06/20/22 1145  BP: 123/84 (!) 134/92  Pulse: 90 100  Resp: 13 14  Temp:    SpO2: 97% 93%   Body mass index is 35.67 kg/m.  General:  NAD Gait: Not observed HENT: WNL, normocephalic Pulmonary: normal non-labored breathing Cardiac: regular, without  Murmurs, rubs or gallops Abdomen: soft, NT/ND Vascular Exam/Pulses: Right AKA Left femoral pulse palpable Left popliteal pulse palpable Left PT palpable Left foot motor/sensory intact Musculoskeletal: no muscle wasting or atrophy  Neurologic: A&O X 3; Appropriate Affect ; SENSATION: normal; MOTOR FUNCTION:  moving all extremities equally. Speech is fluent/normal   CBC    Component Value Date/Time   WBC 5.8 06/19/2022 1750   RBC 4.21 06/19/2022 1750   HGB 12.0 06/19/2022 1750   HCT 35.8 (L) 06/19/2022 1750   PLT 150 06/19/2022 1750   MCV 85.0 06/19/2022 1750   MCH 28.5 06/19/2022 1750   MCHC 33.5 06/19/2022 1750   RDW 15.6 (H) 06/19/2022 1750   LYMPHSABS 0.9 06/19/2022 1750   MONOABS 0.4 06/19/2022 1750  EOSABS 0.3 06/19/2022 1750   BASOSABS 0.0 06/19/2022 1750    BMET    Component Value Date/Time   NA 134 (L) 06/19/2022 1750   K 4.4 06/19/2022 1750   CL 99 06/19/2022 1750   CO2 26 06/19/2022 1750   GLUCOSE 150 (H) 06/19/2022 1750   BUN  10 06/19/2022 1750   CREATININE 0.63 06/19/2022 1750   CALCIUM 9.3 06/19/2022 1750   GFRNONAA >60 06/19/2022 1750   GFRAA >60 01/05/2020 1754    COAGS: No results found for: "INR", "PROTIME"   Non-Invasive Vascular Imaging:     Narrative & Impression  CLINICAL DATA:  71 year old female with a history of claudication/ischemia   EXAM: CT ANGIOGRAPHY OF ABDOMINAL AORTA WITH ILIOFEMORAL RUNOFF   TECHNIQUE: Multidetector CT imaging of the abdomen, pelvis and lower extremities was performed using the standard protocol during bolus administration of intravenous contrast. Multiplanar CT image reconstructions and MIPs were obtained to evaluate the vascular anatomy.   RADIATION DOSE REDUCTION: This exam was performed according to the departmental dose-optimization program which includes automated exposure control, adjustment of the mA and/or kV according to patient size and/or use of iterative reconstruction technique.   CONTRAST:  166mL OMNIPAQUE IOHEXOL 350 MG/ML SOLN   COMPARISON:  01/05/2020   FINDINGS: VASCULAR   Aorta: Minimal atherosclerotic changes of the lower thoracic aorta.   Diameter at the hiatus estimated 2.5 cm.   Mild atherosclerotic changes of the abdominal aorta. No pedunculated plaque, ulcerated plaque, dissection, or aneurysm. No arterial wall thickening or periaortic fluid.   Celiac: Minimal atherosclerosis of the celiac artery origin. Branches are patent.   SMA: Mild atherosclerosis of the SMA origin. No high-grade stenosis. Branches are patent.   Renals:   - Right: Single right renal artery. Mild atherosclerosis at the origin. No evidence of high-grade stenosis. No pre hilar branching.   - Left: Single left renal artery. Mild atherosclerosis at the origin. No high-grade stenosis. No pre hilar branching.   IMA: Inferior mesenteric artery is patent.   Right lower extremity:   Unremarkable course, caliber, and contour of the right iliac  system. No aneurysm, dissection, or occlusion. Hypogastric artery is patent. Common femoral artery patent. The proximal SFA and profunda femoris are patent, though are attenuated in this patient with above knee amputation.   Left lower extremity:   Unremarkable course, caliber, and contour of the left iliac system. No aneurysm, dissection, or occlusion. Hypogastric artery is patent, with soft plaque at the origin contributing to estimated 50% narrowing at the origin. Pelvic arteries are patent.   Common femoral artery patent.   Profunda femoris and the thigh branches are patent.   SFA is patent throughout its length with no significant atherosclerotic changes. Popliteal artery is patent above the knee and below the knee. The segment at the knee is somewhat obscured by streak artifact from the knee arthroplasty. Given the overall appearance the segment behind the knee is favored to be patent.   Typical trifurcation.   Proximal anterior tibial artery patent.   Tibioperoneal trunk is patent.   Attenuated/hypoplastic peroneal artery which occludes proximally.   No significant contrast within the distal posterior tibial artery and distal anterior tibial artery on the arterial phase.   Late phase imaging demonstrates contrast filling the length of the posterior tibial artery to the foot.   Small caliber anterior tibial artery which is patent in the proximal and mid third. Attenuated contrast column at the ankle, may be secondary to small caliber versus occlusion  distally.   Veins: Unremarkable appearance of the venous system on the arterial phase imaging.   Review of the MIP images confirms the above findings.   NON-VASCULAR   Lower chest: No acute.   Hepatobiliary: Nodular contour of the liver surface. Enlarged caudate and left liver. Cholecystectomy   Pancreas: Unremarkable.   Spleen: Cranial caudal span of spleen greater than 16 cm.   Adrenals/Urinary Tract:    - Right adrenal gland: Unremarkable   - Left adrenal gland: Unremarkable.   - Right kidney: No hydronephrosis, nephrolithiasis, inflammation, or ureteral dilation. No focal lesion.   - Left Kidney: No hydronephrosis, nephrolithiasis, inflammation, or ureteral dilation. No focal lesion.   - Urinary Bladder: Bladder partially distended   Stomach/Bowel:   - Stomach: Small hiatal hernia.  Otherwise unremarkable stomach   - Small bowel: Unremarkable   - Appendix: Appendix is not visualized, however, no inflammatory changes are present adjacent to the cecum to indicate an appendicitis.   - Colon: Moderate formed stool burden. No focal inflammatory changes.   Lymphatic: No adenopathy.   Mesenteric: No free fluid or air. No mesenteric adenopathy.   Reproductive: Unremarkable uterus/adnexa.   Other: Surgical changes of prior hernia repair with attenuation of the anterior abdominal wall musculature. Edema/stranding of the fat within the pannus. No focal fluid. Nonspecific pelvic floor laxity.   Musculoskeletal: Atrophic musculature of the proximal right lower extremity compatible with changes of BKA. Degenerative changes of the left greater than right hip. Degenerative changes of the visualized thoracolumbar spine. No acute displaced fracture. No aggressive lytic or sclerotic lesions identified. Similar appearance of L3 compression fracture. Surgical changes of left knee arthroplasty   Nonspecific edema at the left ankle/foot. Degenerative changes of the foot. Surgical changes of the forefoot. All of this would be better characterized on plain film imaging if indicated.   IMPRESSION: CT angiogram demonstrates inline patency of the aorta, left iliac system, left femoropopliteal system, and the left posterior tibial artery to the foot. There is unopacified left peroneal artery, and also decreased contrast within the distal left anterior tibial artery, which may be secondary  to late arrival of the contrast secondary to small artery versus distal embolization.   Mild multilevel PA D, including:   -aortic atherosclerosis Aortic Atherosclerosis (ICD10-I70.0).   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   Additional ancillary findings as above.   Signed,   Yvone Neu. Loreta Ave, DO, ABVM, RPVI    ASSESSMENT/PLAN: This is a 71 y.o. female with multiple comorbidities as listed above that vascular surgery was consulted with concern for left foot pain and arterial insufficiency/ischemia.  I have reviewed her CTA and I do not see any evidence of acute arterial occlusion in the left lower extremity.  On exam she has a palpable femoral, popliteal and posterior tibial pulse.  She has no pain in the left foot.  The left foot is motor/sensory intact.  She can follow-up with vascular as needed.  I do not think she has any acute arterial occlusion or other evidence of chronic vascular disease that would warrant intervention.  She is here for left foot/leg swelling as her presenting complaint and it would be reasonable  to get a venous duplex to rule out DVT study.  She can follow-up with [place] PRN.  Cephus Shelling, MD [facility] Office: [phone]  Cephus Shelling

## 2022-06-20 NOTE — Discharge Instructions (Addendum)
The CT of your left leg did not show any occlusion.  The ultrasound that we also obtained of your left leg did not show any signs of a blood clot.  Please continue follow-up with your primary care physician as needed.  You may alternate ibuprofen and Motrin to help with your pain.

## 2022-06-26 ENCOUNTER — Ambulatory Visit: Payer: Medicare Other | Admitting: Pulmonary Disease

## 2022-07-10 ENCOUNTER — Ambulatory Visit (INDEPENDENT_AMBULATORY_CARE_PROVIDER_SITE_OTHER): Payer: Medicare Other | Admitting: Pulmonary Disease

## 2022-07-10 ENCOUNTER — Encounter: Payer: Self-pay | Admitting: Pulmonary Disease

## 2022-07-10 VITALS — BP 112/70 | HR 72 | Temp 98.5°F | Ht 62.0 in | Wt 195.0 lb

## 2022-07-10 DIAGNOSIS — G4733 Obstructive sleep apnea (adult) (pediatric): Secondary | ICD-10-CM | POA: Diagnosis not present

## 2022-07-10 NOTE — Progress Notes (Signed)
@Patient  ID: , female    DOB: [birth year], 71 y.o.   MRN: [id number]  Has been taken off her CPAP at night   [id number], MD  HPI: 71 year old female former smoker followed for obstructive sleep apnea  Continues to use CPAP on a nightly basis Not having any significant problems with the CPAP  She does have mild obstructive sleep apnea diagnosed in 2021 has been on CPAP 11  Uses CPAP nightly Wakes up like she is at a good nights rest  Recently treated for COVID, lasted about 10 days in the hospital She feels she is back to baseline Health has been relatively well  Was previously on BiPAP  [facility]. Does not drive .   Requires XS full face mask.   Allergies  Allergen Reactions   Hydromorphone Hcl Rash    Pt states she did not have a rash on this medication   Hydrocodone-Acetaminophen Itching    Makes face itch    Hydromorphone     Sleepy   Lotrel [Amlodipine Besy-Benazepril Hcl] Cough   Oxycontin [Oxycodone Hcl] Itching   Saccharin     Causes stomach pain    Tape     Blister    Adhesive [Tape] Rash    Causes blistering   Amlodipine Besy-Benazepril Hcl Cough   Saccharin Other (See Comments)    Immunization History  Administered Date(s) Administered   Influenza, High Dose Seasonal PF 04/20/2018, 05/16/2019, 03/20/2020   Influenza, Quadrivalent, Recombinant, Inj, Pf 05/22/2017   Influenza-Unspecified 06/28/2022   Moderna Sars-Covid-2 Vaccination 10/14/2019, 11/11/2019, 07/12/2020   Pneumococcal Conjugate-13 01/28/2014   Pneumococcal Polysaccharide-23 04/16/2017   Tdap 02/23/2009, 08/20/2016   Zoster, Live 08/21/2015    Past Medical History:  Diagnosis Date   Alpha (0) thalassemia (HCC)    Minor   Alpha thalassemia minor    Arthritis    Asthma    Complication of anesthesia    Combative   Depression    Diabetes mellitus without complication (HCC)    Diabetes mellitus without complication (HCC)    type 2     Dysrhythmia    Familial Mediterranean fever (HCC)    Headache    History of hiatal hernia    Hypertension    Hypothyroidism    Pneumonia    PONV (postoperative nausea and vomiting) 1979   Sleep apnea    Sleep apnea    No CPOP    Tobacco History: Social History   Tobacco Use  Smoking Status Former   Packs/day: 1.00   Years: 40.00   Total pack years: 40.00   Types: Cigarettes   Quit date: 1978   Years since quitting: 45.9  Smokeless Tobacco Never  Tobacco Comments   2015   Counseling given: Not Answered Tobacco comments: 2015    Outpatient Medications Prior to Visit  Medication Sig Dispense Refill   albuterol (VENTOLIN HFA) 108 (90 Base) MCG/ACT inhaler Inhale 2 puffs into the lungs every 6 (six) hours as needed for wheezing or shortness of breath.     amLODipine (NORVASC) 2.5 MG tablet Take 2.5 mg by mouth daily.     aspirin EC 81 MG tablet Take 81 mg by mouth daily. Swallow whole.     atorvastatin (LIPITOR) 20 MG tablet Take 20 mg by mouth every evening.     B Complex Vitamins (VITAMIN B COMPLEX PO) Take 1 tablet by mouth at bedtime.     benzonatate (TESSALON) 100 MG capsule Take 1 capsule (100  mg total) by mouth every 8 (eight) hours. 21 capsule 0   buPROPion (WELLBUTRIN XL) 300 MG 24 hr tablet Take 300 mg by mouth daily.     butalbital-acetaminophen-caffeine (FIORICET) 50-325-40 MG tablet Take 1 tablet by mouth every 6 (six) hours as needed for migraine.     Calcium Carbonate Antacid (ALKA-SELTZER ANTACID PO) Take 1 tablet by mouth daily as needed (gummie.  chew one tablet if needed for GERD).     CALCIUM-VITAMIN D PO Take 1 tablet by mouth in the morning and at bedtime.     celecoxib (CELEBREX) 100 MG capsule Take 100 mg by mouth 2 (two) times daily.     Cholecalciferol (VITAMIN D3) 125 MCG (5000 UT) TABS Take 5,000 Units by mouth every evening.     colchicine 0.6 MG tablet Take 0.6 mg by mouth 2 (two) times daily.     docusate sodium (COLACE) 250 MG capsule Take  250 mg by mouth at bedtime.     famotidine (PEPCID) 20 MG tablet Take 20 mg by mouth 2 (two) times daily.     insulin lispro (HUMALOG) 100 UNIT/ML KwikPen Inject 5-7 Units into the skin 3 (three) times daily with meals. Sliding scale     ipratropium (ATROVENT) 0.02 % nebulizer solution Take 2.5 mLs (0.5 mg total) by nebulization 4 (four) times daily. 75 mL 0   lactobacillus acidophilus (BACID) TABS tablet Take 1 tablet by mouth daily.     lamoTRIgine (LAMICTAL) 150 MG tablet Take 150 mg by mouth at bedtime.     LEVEMIR 100 UNIT/ML injection Inject 17 Units into the skin at bedtime.     levothyroxine (SYNTHROID) 50 MCG tablet Take 50 mcg by mouth daily before breakfast.     melatonin 5 MG TABS Take 5 mg by mouth at bedtime.     metFORMIN (GLUCOPHAGE-XR) 500 MG 24 hr tablet Take 500 mg by mouth 2 (two) times daily with a meal.      metoprolol succinate (TOPROL-XL) 25 MG 24 hr tablet Take 25 mg by mouth daily.     Multiple Vitamin (MULTIVITAMIN WITH MINERALS) TABS tablet Take 1 tablet by mouth every evening.     nortriptyline (PAMELOR) 75 MG capsule Take 75 mg by mouth at bedtime.     olmesartan (BENICAR) 20 MG tablet Take 20 mg by mouth daily.     ondansetron (ZOFRAN ODT) 4 MG disintegrating tablet Take 1 tablet (4 mg total) by mouth every 8 (eight) hours as needed for nausea or vomiting. 5 tablet 0   pantoprazole (PROTONIX) 40 MG tablet Take 40 mg by mouth at bedtime.     traZODone (DESYREL) 50 MG tablet Take 100 mg by mouth at bedtime.     UNABLE TO FIND Take 25 mg by mouth in the morning and at bedtime. CBD GUMMY     vitamin C (ASCORBIC ACID) 500 MG tablet Take 1,000 mg by mouth every evening.     Aspirin Effervescent (ALKA-SELTZER PO) Take 2-3 tablets by mouth daily as needed (stomach pain). (Patient not taking: Reported on 07/10/2022)     No facility-administered medications prior to visit.     Review of Sytems:  Review of systems unremarkable Denies any GI symptoms Denies any ongoing  musculoskeletal pain or discomfort Wheelchair-bound   Physical Exam  Middle-age lady does not appear to be in distress She is wheelchair-borne Moist oral mucosa Clear breath sounds S1-S2 appreciated Right AKA  BP 112/70 (BP Location: Left Arm, Patient Position: Sitting, Cuff Size: Large)  Pulse 72   Temp 98.5 F (36.9 C) (Oral)   Ht 5\' 2"  (1.575 m)   Wt 195 lb (88.5 kg)   SpO2 98%   BMI 35.67 kg/m   Sleep study was 01/17/2020 -Titrated CPAP of 11  CPAP compliance shows 97% compliance CPAP of 11 Residual AHI of 3.6  Assessment & Plan:   Obstructive sleep apnea -Encouraged to continue CPAP on a nightly basis  She is having shoulder surgery next week -She has not had any problems with her CPAP therapy -Recently treated for pneumonia but back to baseline  No preclusion to surgery at present  I will see her back in about a year if she continues to do well   01/19/2020, MD 07/10/2022

## 2022-07-10 NOTE — Patient Instructions (Signed)
DUE TO COVID-19 ONLY TWO VISITORS  (aged 70 and older)  ARE ALLOWED TO COME WITH YOU AND STAY IN THE WAITING ROOM ONLY DURING PRE OP AND PROCEDURE.   **NO VISITORS ARE ALLOWED IN THE SHORT STAY AREA OR RECOVERY ROOM!!**  IF YOU WILL BE ADMITTED INTO THE HOSPITAL YOU ARE ALLOWED ONLY FOUR SUPPORT PEOPLE DURING VISITATION HOURS ONLY (7 AM -8PM)   The support person(s) must pass our screening, gel in and out, and wear a mask at all times, including in the patient's room. Patients must also wear a mask when staff or their support person are in the room. Visitors GUEST BADGE MUST BE WORN VISIBLY  One adult visitor may remain with you overnight and MUST be in the room by 8 P.M.     Your procedure is scheduled on: 07/18/22   Report to [facility] Main Entrance    Report to admitting at : 9:30 AM   Call this number if you have problems the morning of surgery [phone]   Do not eat food :After Midnight.   After Midnight you may have the following liquids until : 9:00 AM DAY OF SURGERY  Water Black Coffee (sugar ok, NO MILK/CREAM OR CREAMERS)  Tea (sugar ok, NO MILK/CREAM OR CREAMERS) regular and decaf                             Plain Jell-O (NO RED)                                           Fruit ices (not with fruit pulp, NO RED)                                     Popsicles (NO RED)                                                                  Juice: apple, WHITE grape, WHITE cranberry Sports drinks like Gatorade (NO RED)    The day of surgery:  Drink ONE (1) Pre-Surgery Clear Ensure or G2 at : 9:00 AM the morning of surgery. Drink in one sitting. Do not sip.  This drink was given to you during your hospital  pre-op appointment visit. Nothing else to drink after completing the  Pre-Surgery Clear Ensure or G2.          If you have questions, please contact your surgeon's office.   Oral Hygiene is also important to reduce your risk of infection.                                     Remember - BRUSH YOUR TEETH THE MORNING OF SURGERY WITH YOUR REGULAR TOOTHPASTE   Do NOT smoke after Midnight   Take these medicines the morning of surgery with A SIP OF WATER:bupropion,metoprolol,amlodipine,colchicine,levothyroxine,famotidine.Fioricet as needed.   How to Manage Your Diabetes Before and After Surgery  Why is it important to control my  blood sugar before and after surgery? Improving blood sugar levels before and after surgery helps healing and can limit problems. A way of improving blood sugar control is eating a healthy diet by:  Eating less sugar and carbohydrates  Increasing activity/exercise  Talking with your doctor about reaching your blood sugar goals High blood sugars (greater than 180 mg/dL) can raise your risk of infections and slow your recovery, so you will need to focus on controlling your diabetes during the weeks before surgery. Make sure that the doctor who takes care of your diabetes knows about your planned surgery including the date and location.  How do I manage my blood sugar before surgery? Check your blood sugar at least 4 times a day, starting 2 days before surgery, to make sure that the level is not too high or low. Check your blood sugar the morning of your surgery when you wake up and every 2 hours until you get to the Short Stay unit. If your blood sugar is less than 70 mg/dL, you will need to treat for low blood sugar: Do not take insulin. Treat a low blood sugar (less than 70 mg/dL) with  cup of clear juice (cranberry or apple), 4 glucose tablets, OR glucose gel. Recheck blood sugar in 15 minutes after treatment (to make sure it is greater than 70 mg/dL). If your blood sugar is not greater than 70 mg/dL on recheck, call [phone] for further instructions. Report your blood sugar to the short stay nurse when you get to Short Stay.  If you are admitted to the hospital after surgery: Your blood sugar will be checked by the staff  and you will probably be given insulin after surgery (instead of oral diabetes medicines) to make sure you have good blood sugar levels. The goal for blood sugar control after surgery is 80-180 mg/dL.   WHAT DO I DO ABOUT MY DIABETES MEDICATION?  Do not take oral diabetes medicines (pills) the morning of surgery.  THE NIGHT BEFORE SURGERY, DO NOT take bedtime dose of lispro insulin. Take ONLY half of the levemir insulin dose.Take metformin as usual.      THE MORNING OF SURGERY, DO NOT TAKE ANY ORAL DIABETIC MEDICATIONS DAY OF YOUR SURGERY  If your CBG is greater than 220 mg/dL, you may take  of your sliding scale  (correction) dose of insulin.    Bring CPAP mask and tubing day of surgery.                              You may not have any metal on your body including hair pins, jewelry, and body piercing             Do not wear make-up, lotions, powders, perfumes/cologne, or deodorant  Do not wear nail polish including gel and S&S, artificial/acrylic nails, or any other type of covering on natural nails including finger and toenails. If you have artificial nails, gel coating, etc. that needs to be removed by a nail salon please have this removed prior to surgery or surgery may need to be canceled/ delayed if the surgeon/ anesthesia feels like they are unable to be safely monitored.   Do not shave  48 hours prior to surgery.   Do not bring valuables to the hospital. [place].   Contacts, dentures or bridgework may  not be worn into surgery.   Bring small overnight bag day of surgery.   DO NOT [place]. PHARMACY WILL DISPENSE MEDICATIONS LISTED ON YOUR MEDICATION LIST TO YOU DURING YOUR ADMISSION [place]!    Patients discharged on the day of surgery will not be allowed to drive home.  Someone NEEDS to stay with you for the first 24 hours after anesthesia.   Special Instructions: Bring a copy  of your healthcare power of attorney and living will documents         the day of surgery if you haven't scanned them before.              Please read over the following fact sheets you were given: IF YOU HAVE QUESTIONS ABOUT YOUR PRE-OP INSTRUCTIONS PLEASE CALL [phone]    [facility] - Preparing for Surgery Before surgery, you can play an important role.  Because skin is not sterile, your skin needs to be as free of germs as possible.  You can reduce the number of germs on your skin by washing with CHG (chlorahexidine gluconate) soap before surgery.  CHG is an antiseptic cleaner which kills germs and bonds with the skin to continue killing germs even after washing. Please DO NOT use if you have an allergy to CHG or antibacterial soaps.  If your skin becomes reddened/irritated stop using the CHG and inform your nurse when you arrive at Short Stay. Do not shave (including legs and underarms) for at least 48 hours prior to the first CHG shower.  You may shave your face/neck. Please follow these instructions carefully:  1.  Shower with CHG Soap the night before surgery and the  morning of Surgery.  2.  If you choose to wash your hair, wash your hair first as usual with your  normal  shampoo.  3.  After you shampoo, rinse your hair and body thoroughly to remove the  shampoo.                           4.  Use CHG as you would any other liquid soap.  You can apply chg directly  to the skin and wash                       Gently with a scrungie or clean washcloth.  5.  Apply the CHG Soap to your body ONLY FROM THE NECK DOWN.   Do not use on face/ open                           Wound or open sores. Avoid contact with eyes, ears mouth and genitals (private parts).                       Wash face,  Genitals (private parts) with your normal soap.             6.  Wash thoroughly, paying special attention to the area where your surgery  will be performed.  7.  Thoroughly rinse your body with warm water from  the neck down.  8.  DO NOT shower/wash with your normal soap after using and rinsing off  the CHG Soap.                9.  Pat yourself dry with a clean towel.  10.  Wear clean pajamas.            11.  Place clean sheets on your bed the night of your first shower and do not  sleep with pets. Day of Surgery : Do not apply any lotions/deodorants the morning of surgery.  Please wear clean clothes to the hospital/surgery center.  FAILURE TO FOLLOW THESE INSTRUCTIONS MAY RESULT IN THE CANCELLATION OF YOUR SURGERY PATIENT SIGNATURE_________________________________  NURSE SIGNATURE__________________________________  ________________________________________________________________________[facility]- Preparing for Total Shoulder Arthroplasty    Before surgery, you can play an important role. Because skin is not sterile, your skin needs to be as free of germs as possible. You can reduce the number of germs on your skin by using the following products. Benzoyl Peroxide Gel Reduces the number of germs present on the skin Applied twice a day to shoulder area starting two days before surgery    ==================================================================  Please follow these instructions carefully:  BENZOYL PEROXIDE 5% GEL  Please do not use if you have an allergy to benzoyl peroxide.   If your skin becomes reddened/irritated stop using the benzoyl peroxide.  Starting two days before surgery, apply as follows: Apply benzoyl peroxide in the morning and at night. Apply after taking a shower. If you are not taking a shower clean entire shoulder front, back, and side along with the armpit with a clean wet washcloth.  Place a quarter-sized dollop on your shoulder and rub in thoroughly, making sure to cover the front, back, and side of your shoulder, along with the armpit.   2 days before ____ AM   ____ PM              1 day before ____ AM   ____ PM                         Do this  twice a day for two days.  (Last application is the night before surgery, AFTER using the CHG soap as described below).  Do NOT apply benzoyl peroxide gel on the day of surgery.  Incentive Spirometer  An incentive spirometer is a tool that can help keep your lungs clear and active. This tool measures how well you are filling your lungs with each breath. Taking long deep breaths may help reverse or decrease the chance of developing breathing (pulmonary) problems (especially infection) following: A long period of time when you are unable to move or be active. BEFORE THE PROCEDURE  If the spirometer includes an indicator to show your best effort, your nurse or respiratory therapist will set it to a desired goal. If possible, sit up straight or lean slightly forward. Try not to slouch. Hold the incentive spirometer in an upright position. INSTRUCTIONS FOR USE  Sit on the edge of your bed if possible, or sit up as far as you can in bed or on a chair. Hold the incentive spirometer in an upright position. Breathe out normally. Place the mouthpiece in your mouth and seal your lips tightly around it. Breathe in slowly and as deeply as possible, raising the piston or the ball toward the top of the column. Hold your breath for 3-5 seconds or for as long as possible. Allow the piston or ball to fall to the bottom of the column. Remove the mouthpiece from your mouth and breathe out normally. Rest for a few seconds and repeat Steps 1 through 7 at least 10 times every 1-2 hours when you are  awake. Take your time and take a few normal breaths between deep breaths. The spirometer may include an indicator to show your best effort. Use the indicator as a goal to work toward during each repetition. After each set of 10 deep breaths, practice coughing to be sure your lungs are clear. If you have an incision (the cut made at the time of surgery), support your incision when coughing by placing a pillow or rolled up  towels firmly against it. Once you are able to get out of bed, walk around indoors and cough well. You may stop using the incentive spirometer when instructed by your caregiver.  RISKS AND COMPLICATIONS Take your time so you do not get dizzy or light-headed. If you are in pain, you may need to take or ask for pain medication before doing incentive spirometry. It is harder to take a deep breath if you are having pain. AFTER USE Rest and breathe slowly and easily. It can be helpful to keep track of a log of your progress. Your caregiver can provide you with a simple table to help with this. If you are using the spirometer at home, follow these instructions: [place] IF:  You are having difficultly using the spirometer. You have trouble using the spirometer as often as instructed. Your pain medication is not giving enough relief while using the spirometer. You develop fever of 100.5 F (38.1 C) or higher. SEEK IMMEDIATE MEDICAL CARE IF:  You cough up bloody sputum that had not been present before. You develop fever of 102 F (38.9 C) or greater. You develop worsening pain at or near the incision site. MAKE SURE YOU:  Understand these instructions. Will watch your condition. Will get help right away if you are not doing well or get worse. Document Released: 12/17/2006 Document Revised: 10/29/2011 Document Reviewed: 02/17/2007 [facility] Patient Information 2014 [place], [place].   ________________________________________________________________________

## 2022-07-10 NOTE — Patient Instructions (Addendum)
Continue using CPAP on a nightly basis  Call with significant concerns  Good luck with your shoulder surgery  You should not have any significant problems as long as you are feeling well prior to the surgery  I will see you a year from here

## 2022-07-11 ENCOUNTER — Encounter (HOSPITAL_COMMUNITY)
Admission: RE | Admit: 2022-07-11 | Discharge: 2022-07-11 | Disposition: A | Discharge status: Home / Self Care | Payer: Medicare Other | Source: Ambulatory Visit | Attending: Orthopaedic Surgery | Admitting: Orthopaedic Surgery

## 2022-07-11 ENCOUNTER — Other Ambulatory Visit: Payer: Self-pay

## 2022-07-11 ENCOUNTER — Encounter (HOSPITAL_COMMUNITY): Payer: Self-pay

## 2022-07-11 VITALS — BP 96/68 | HR 65 | Temp 97.8°F | Ht 62.0 in | Wt 195.0 lb

## 2022-07-11 DIAGNOSIS — E119 Type 2 diabetes mellitus without complications: Secondary | ICD-10-CM | POA: Diagnosis not present

## 2022-07-11 DIAGNOSIS — I2510 Atherosclerotic heart disease of native coronary artery without angina pectoris: Secondary | ICD-10-CM | POA: Insufficient documentation

## 2022-07-11 DIAGNOSIS — Z01818 Encounter for other preprocedural examination: Secondary | ICD-10-CM | POA: Insufficient documentation

## 2022-07-11 LAB — BASIC METABOLIC PANEL
Anion gap: 5 (ref 5–15)
BUN: 10 mg/dL (ref 8–23)
CO2: 28 mmol/L (ref 22–32)
Calcium: 9.3 mg/dL (ref 8.9–10.3)
Chloride: 103 mmol/L (ref 98–111)
Creatinine, Ser: 0.51 mg/dL (ref 0.44–1.00)
GFR, Estimated: >60 mL/min (ref >60)
Glucose, Bld: 150 mg/dL — ABNORMAL HIGH (ref 70–99)
Potassium: 4.2 mmol/L (ref 3.5–5.1)
Sodium: 136 mmol/L (ref 135–145)

## 2022-07-11 LAB — CBC
HCT: 33.9 % — ABNORMAL LOW (ref 36.0–46.0)
Hemoglobin: 11.5 g/dL — ABNORMAL LOW (ref 12.0–15.0)
MCH: 28.6 pg (ref 26.0–34.0)
MCHC: 33.9 g/dL (ref 30.0–36.0)
MCV: 84.3 fL (ref 80.0–100.0)
Platelets: 149 10*3/uL — ABNORMAL LOW (ref 150–400)
RBC: 4.02 MIL/uL (ref 3.87–5.11)
RDW: 16.5 % — ABNORMAL HIGH (ref 11.5–15.5)
WBC: 4.9 10*3/uL (ref 4.0–10.5)
nRBC: 0 % (ref 0.0–0.2)

## 2022-07-11 LAB — HEMOGLOBIN A1C
Hgb A1c MFr Bld: 5.3 % (ref 4.8–5.6)
Mean Plasma Glucose: 105.41 mg/dL

## 2022-07-11 LAB — SURGICAL PCR SCREEN
MRSA, PCR: NEGATIVE
Staphylococcus aureus: NEGATIVE

## 2022-07-11 LAB — GLUCOSE, CAPILLARY: Glucose-Capillary: 167 mg/dL — ABNORMAL HIGH (ref 70–99)

## 2022-07-11 NOTE — Progress Notes (Signed)
For Short Stay: COVID SWAB appointment date:  Bowel Prep reminder:   For Anesthesia: PCP - Dr. Antony Haste: Clearance: 06/20/22: Chart. Cardiologist - Young Berry Ruoff  Chest x-ray - 09/26/21 EKG - 09/27/21 Stress Test -  ECHO -  Cardiac Cath -  Pacemaker/ICD device last checked: Pacemaker orders received: Device Rep notified:  Spinal Cord Stimulator:  Sleep Study - Yes CPAP - NO  Fasting Blood Sugar - 80's - 90's Checks Blood Sugar : continuous monitor Date and result of last Hgb A1c-  Last dose of GLP1 agonist-  GLP1 instructions:   Last dose of SGLT-2 inhibitors-  SGLT-2 instructions:   Blood Thinner Instructions: Aspirin Instructions: On hold since 07/11/22 Last Dose:  Activity level: Can go up a flight of stairs and activities of daily living without stopping and without chest pain and/or shortness of breath   Able to exercise without chest pain and/or shortness of breath   Unable to go up a flight of stairs without chest pain and/or shortness of breath     Anesthesia review:  Hx: HTN,DIA,Rt. BBB,OSA(NO CPAP)  Patient denies shortness of breath, fever, cough and chest pain at PAT appointment   Patient verbalized understanding of instructions that were given to them at the PAT appointment. Patient was also instructed that they will need to review over the PAT instructions again at home before surgery.

## 2022-07-16 ENCOUNTER — Encounter (HOSPITAL_COMMUNITY): Payer: Self-pay | Admitting: Physician Assistant

## 2022-07-16 NOTE — Progress Notes (Signed)
Anesthesia Chart Review   Case: [id number] Date/Time: 07/18/22 1145   Procedure: REVERSE SHOULDER ARTHROPLASTY (Right: Shoulder)   Anesthesia type: General   Pre-op diagnosis: djd right shoulder   Location: [facility] ROOM 06 / [facility]   Surgeons: Bjorn Pippin, MD       DISCUSSION:71 y.o. former smoker with h/o PONV, sleep apnea, DM II, alpha thalassemia minor, djd right shoulder scheduled for above procedure 07/18/2022 with Dr. Ramond Marrow.   Pt seen by cardio 12/07/2021.  Followed for sleep apnea and sinus tachycardia.  On metoprolol.  No changes at last visit.   Anticipate pt can proceed with planned procedure barring acute status change.   VS: There were no vitals taken for this visit.  PROVIDERS: Eartha Inch, MD is PCP    LABS: Labs reviewed: Acceptable for surgery. (all labs ordered are listed, but only abnormal results are displayed)  Labs Reviewed - No data to display   IMAGES:   EKG:   CV: Echo 12/04/2021 Technically difficult but adequate 2D M-mode and color flow Doppler  echocardiogram demonstrates grossly normal left ventricular chamber size  and contractility.  Ejection fraction is grossly normal.  2.  The aortic valve is mildly thickened but opens adequately.  There is  no significant aortic stenosis or aortic insufficiency  3.  The mitral and tricuspid valves are grossly normal.  4.  The atria are of normal size bilaterally  5.  Right ventricular structure and function is normal  6.  The pericardium is of normal thickness there is no effusion  Past Medical History:  Diagnosis Date   Alpha (0) thalassemia (HCC)    Minor   Alpha thalassemia minor    Arthritis    Asthma    Complication of anesthesia    Combative   Depression    Diabetes mellitus without complication (HCC)    Diabetes mellitus without complication (HCC)    type 2    Dysrhythmia    Familial Mediterranean fever (HCC)    Headache    History of hiatal hernia    Hypertension     Hypothyroidism    Pneumonia    PONV (postoperative nausea and vomiting) 1979   Sleep apnea    Sleep apnea    No CPOP    Past Surgical History:  Procedure Laterality Date   APPENDECTOMY     ARTHROSCOPY KNEE W/ DRILLING     CESAREAN SECTION     x2   CESAREAN SECTION  4818,5631   CHOLECYSTECTOMY     CHOLECYSTECTOMY  1975   CYST EXCISION Left    arm   EXPLORATORY LAPAROTOMY  1985   HERNIA REPAIR     x3   HERNIA REPAIR     3 hernias   KNEE SURGERY Left 1900   LAPAROTOMY  1985   LEG AMPUTATION     AKA   LEG AMPUTATION Right 2012   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    amLODipine (  NORVASC) 2.5 MG tablet   aspirin EC 81 MG tablet   Aspirin Effervescent (ALKA-SELTZER PO)   atorvastatin (LIPITOR) 20 MG tablet   B Complex Vitamins (VITAMIN B COMPLEX PO)   buPROPion (WELLBUTRIN XL) 300 MG 24 hr tablet   butalbital-acetaminophen-caffeine (FIORICET) 50-325-40 MG tablet   CALCIUM-VITAMIN D PO   celecoxib (CELEBREX) 100 MG capsule   Cholecalciferol (VITAMIN D3) 125 MCG (5000 UT) TABS   colchicine 0.6 MG tablet   docusate sodium (COLACE) 250 MG capsule   famotidine (PEPCID) 20 MG tablet   insulin lispro (HUMALOG) 100 UNIT/ML KwikPen   lactobacillus acidophilus (BACID) TABS tablet   lamoTRIgine (LAMICTAL) 150 MG tablet   LEVEMIR 100 UNIT/ML injection   levothyroxine (SYNTHROID) 50 MCG tablet   melatonin 5 MG TABS    metFORMIN (GLUCOPHAGE-XR) 500 MG 24 hr tablet   metoprolol succinate (TOPROL-XL) 25 MG 24 hr tablet   Multiple Vitamin (MULTIVITAMIN WITH MINERALS) TABS tablet   nortriptyline (PAMELOR) 75 MG capsule   olmesartan (BENICAR) 20 MG tablet   ondansetron (ZOFRAN ODT) 4 MG disintegrating tablet   pantoprazole (PROTONIX) 40 MG tablet   traZODone (DESYREL) 50 MG tablet   UNABLE TO FIND   vitamin C (ASCORBIC ACID) 500 MG tablet   albuterol (VENTOLIN HFA) 108 (90 Base) MCG/ACT inhaler   benzonatate (TESSALON) 100 MG capsule   Calcium Carbonate Antacid (ALKA-SELTZER ANTACID PO)   ipratropium (ATROVENT) 0.02 % nebulizer solution   [facility], PA-C [facility] Pre-Surgical Testing [phone]

## 2022-07-17 ENCOUNTER — Other Ambulatory Visit: Payer: Medicare Other

## 2022-07-17 NOTE — H&P (Signed)
PREOPERATIVE H&P  Chief Complaint: djd right shoulder  HPI: Shirley Mcdonald is a 71 y.o. female who presents for preoperative history and physical prior to scheduled surgery, Procedure(s): REVERSE SHOULDER ARTHROPLASTY.   Patient has a past medical history significant for DM tpye 2, HTN, PONV, sleep apnea, thyroid disease.   The patient is a very pleasant lady who has a history of left humeral head fracture with left reverse total shoulder replacement and did quite well with that.  She is now having right shoulder pain that has been present for a number of months now.  She had an acute worsening of it earlier this month when she had a fall and landed on the right side.  She was treated conservatively with an injection, but it did not get any better.  She now has sharp pain at the shoulder with movement and is frustrated as she uses her right arm to get around.  She is a right-sided above the knee amputee.  Her most recent corticosteroid injection was on October 3rd and again she did not have much relief from that.  She has been wearing a sling in the meantime because she is unable to find a comfortable position.    Symptoms are rated as moderate to severe, and have been worsening.  This is significantly impairing activities of daily living.    Please see clinic note for further details on this patient's care.    She has elected for surgical management.   Past Medical History:  Diagnosis Date   Alpha (0) thalassemia (HCC)    Minor   Alpha thalassemia minor    Arthritis    Asthma    Complication of anesthesia    Combative   Depression    Diabetes mellitus without complication (HCC)    Diabetes mellitus without complication (HCC)    type 2    Dysrhythmia    Familial Mediterranean fever (HCC)    Headache    History of hiatal hernia    Hypertension    Hypothyroidism    Pneumonia    PONV (postoperative nausea and vomiting) 1979   Sleep apnea    Sleep apnea    No CPOP   Past  Surgical History:  Procedure Laterality Date   APPENDECTOMY     ARTHROSCOPY KNEE W/ DRILLING     CESAREAN SECTION     x2   CESAREAN SECTION  [id number]   CHOLECYSTECTOMY     CHOLECYSTECTOMY  1975   CYST EXCISION Left    arm   EXPLORATORY LAPAROTOMY  1985   HERNIA REPAIR     x3   HERNIA REPAIR     3 hernias   KNEE SURGERY Left 1900   LAPAROTOMY  1985   LEG AMPUTATION     AKA   LEG AMPUTATION Right 2012   REPLACEMENT TOTAL KNEE Bilateral 05/2004   REVERSE SHOULDER ARTHROPLASTY Left 02/11/2019   Procedure: REVERSE SHOULDER ARTHROPLASTY;  Surgeon: Bjorn Pippin, MD;  Location: [facility];  Service: Orthopedics;  Laterality: Left;   THIGH FASCIOTOMY Right    TONSILLECTOMY     TOTAL SHOULDER ARTHROPLASTY Right 02/11/2019   ULNAR NERVE TRANSPOSITION Left 08/12/2019   Procedure: LEFT ELBOW ULNAR NERVE DISCOMPRESSION AND TRANSPOSITION;  Surgeon: Bjorn Pippin, MD;  Location: [facility];  Service: Orthopedics;  Laterality: Left;   Social History   Socioeconomic History   Marital status: Widowed    Spouse name: Not on file   Number of children: Not on  file   Years of education: Not on file   Highest education level: Not on file  Occupational History   Not on file  Tobacco Use   Smoking status: Former    Packs/day: 1.00    Years: 40.00    Total pack years: 40.00    Types: Cigarettes    Quit date: 2015    Years since quitting: 8.9   Smokeless tobacco: Never   Tobacco comments:    2015  Vaping Use   Vaping Use: Never used  Substance and Sexual Activity   Alcohol use: Not Currently   Drug use: Yes    Types: Other-see comments    Comment: CBD gumies   Sexual activity: Not on file  Other Topics Concern   Not on file  Social History Narrative   ** Merged History Encounter **       Social Determinants of Health   Financial Resource Strain: Not on file  Food Insecurity: Not on file  Transportation Needs: Not on file  Physical Activity: Not on file  Stress: Not on file   Social Connections: Not on file   No family history on file. Allergies  Allergen Reactions   Hydromorphone Hcl Rash    Pt states she did not have a rash on this medication   Hydrocodone-Acetaminophen Itching    Makes face itch    Hydromorphone     Sleepy   Lotrel [Amlodipine Besy-Benazepril Hcl] Cough   Oxycontin [Oxycodone Hcl] Itching   Saccharin     Causes stomach pain    Tape     Blister    Adhesive [Tape] Rash    Causes blistering   Amlodipine Besy-Benazepril Hcl Cough   Saccharin Other (See Comments)   Prior to Admission medications   Medication Sig Start Date End Date Taking? Authorizing Provider  amLODipine (NORVASC) 2.5 MG tablet Take 2.5 mg by mouth daily.   Yes [provider]  aspirin EC 81 MG tablet Take 81 mg by mouth daily. Swallow whole.   Yes [provider]  Aspirin Effervescent (ALKA-SELTZER PO) Take 2-3 tablets by mouth daily as needed (stomach pain). Patient not taking: Reported on 07/10/2022   Yes [provider]  atorvastatin (LIPITOR) 20 MG tablet Take 20 mg by mouth every evening. 06/24/16  Yes [provider]  B Complex Vitamins (VITAMIN B COMPLEX PO) Take 1 tablet by mouth at bedtime.   Yes [provider]  buPROPion (WELLBUTRIN XL) 300 MG 24 hr tablet Take 300 mg by mouth daily. 08/17/16  Yes [provider]  butalbital-acetaminophen-caffeine (FIORICET) 50-325-40 MG tablet Take 1 tablet by mouth every 6 (six) hours as needed for migraine. 03/29/22  Yes [provider]  CALCIUM-VITAMIN D PO Take 1 tablet by mouth in the morning and at bedtime.   Yes [provider]  celecoxib (CELEBREX) 100 MG capsule Take 100 mg by mouth 2 (two) times daily.   Yes [provider]  Cholecalciferol (VITAMIN D3) 125 MCG (5000 UT) TABS Take 5,000 Units by mouth every evening.   Yes [provider]  colchicine 0.6 MG tablet Take 0.6 mg by mouth 2 (two) times daily.   Yes [provider]  docusate sodium (COLACE) 250 MG capsule Take 250 mg by mouth at bedtime.   Yes [provider]  famotidine (PEPCID) 20 MG tablet Take 20 mg by mouth 2 (two) times daily. 09/12/21  Yes [provider]  insulin lispro (HUMALOG) 100 UNIT/ML KwikPen Inject 5-7  Units into the skin 3 (three) times daily with meals. Sliding scale 08/15/16  Yes [provider]  lactobacillus acidophilus (BACID) TABS tablet Take 1 tablet by mouth daily.   Yes [provider]  lamoTRIgine (LAMICTAL) 150 MG tablet Take 150 mg by mouth at bedtime.   Yes [provider]  LEVEMIR 100 UNIT/ML injection Inject 17 Units into the skin at bedtime. 02/21/21  Yes [provider]  levothyroxine (SYNTHROID) 50 MCG tablet Take 50 mcg by mouth daily before breakfast.   Yes [provider]  melatonin 5 MG TABS Take 5 mg by mouth at bedtime.   Yes [provider]  metFORMIN (GLUCOPHAGE-XR) 500 MG 24 hr tablet Take 500 mg by mouth 2 (two) times daily with a meal.  07/25/16  Yes [provider]  metoprolol succinate (TOPROL-XL) 25 MG 24 hr tablet Take 25 mg by mouth daily.   Yes [provider]  Multiple Vitamin (MULTIVITAMIN WITH MINERALS) TABS tablet Take 1 tablet by mouth every evening.   Yes [provider]  nortriptyline (PAMELOR) 75 MG capsule Take 75 mg by mouth at bedtime.   Yes [provider]  olmesartan (BENICAR) 20 MG tablet Take 20 mg by mouth daily.   Yes [provider]  ondansetron (ZOFRAN ODT) 4 MG disintegrating tablet Take 1 tablet (4 mg total) by mouth every 8 (eight) hours as needed for nausea or vomiting. 01/05/20  Yes Khatri, Hina, PA-C  pantoprazole (PROTONIX) 40 MG tablet Take 40 mg by mouth at bedtime.   Yes [provider]  traZODone (DESYREL) 50 MG tablet Take 100 mg by mouth at bedtime. 07/06/19  Yes [provider]  UNABLE TO FIND Take 25 mg by mouth in the morning and at  bedtime. CBD GUMMY   Yes [provider]  vitamin C (ASCORBIC ACID) 500 MG tablet Take 1,000 mg by mouth every evening.   Yes [provider]  albuterol (VENTOLIN HFA) 108 (90 Base) MCG/ACT inhaler Inhale 2 puffs into the lungs every 6 (six) hours as needed for wheezing or shortness of breath.    [provider]  benzonatate (TESSALON) 100 MG capsule Take 1 capsule (100 mg total) by mouth every 8 (eight) hours. 09/10/20   Palumbo, April, MD  Calcium Carbonate Antacid (ALKA-SELTZER ANTACID PO) Take 1 tablet by mouth daily as needed (gummie.  chew one tablet if needed for GERD).    [provider]  ipratropium (ATROVENT) 0.02 % nebulizer solution Take 2.5 mLs (0.5 mg total) by nebulization 4 (four) times daily. 09/10/20   Palumbo, April, MD    ROS: All other systems have been reviewed and were otherwise negative with the exception of those mentioned in the HPI and as above.  Physical Exam: General: Alert, no acute distress Cardiovascular: No pedal edema Respiratory: No cyanosis, no use of accessory musculature GI: No organomegaly, abdomen is soft and non-tender Skin: No lesions in the area of chief complaint Neurologic: Sensation intact distally Psychiatric: Patient is competent for consent with normal mood and affect Lymphatic: No axillary or cervical lymphadenopathy  MUSCULOSKELETAL:  The patient is seated on the exam table in no acute distress.  Active forward range of motion to about 120 degrees.  Active range of motion in abduction to about 90 degree.  Active external rotation is only about 20 degrees.  Little to no internal rotation.  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  She is tender to palpation over the [facility] joint  and the distal clavicle.  Her strength is 4/5 throughout.  She is neurovascularly intact distally.  She has a positive empty can test and a positive Hawkins test.  Imaging: X-ray of the right shoulder were reviewed from the urgent   care. These demonstrate glenohumeral osteoarthritis and inferior osteophyte present on the humeral head with some signs of proximal migration of the humeral head as well.   BMI: Estimated body mass index is 35.67 kg/m as calculated from the following:   Height as of 07/11/22: 5\' 2"  (1.575 m).   Weight as of 07/11/22: 88.5 kg.  Lab Results  Component Value Date   ALBUMIN 4.1 01/05/2020   Diabetes: Patient does not have a diagnosis of diabetes. Lab Results  Component Value Date   HGBA1C 5.3 07/11/2022     Smoking Status:      Assessment: djd right shoulder  Plan: Plan for Procedure(s): REVERSE SHOULDER ARTHROPLASTY  The risks benefits and alternatives were discussed with the patient including but not limited to the risks of nonoperative treatment, versus surgical intervention including infection, bleeding, nerve injury,  blood clots, cardiopulmonary complications, morbidity, mortality, among others, and they were willing to proceed.   We additionally specifically discussed risks of axillary nerve injury, infection, periprosthetic fracture, continued pain and longevity of implants prior to beginning procedure.    Patient will be admitted for inpatient treatment for surgery, pain control, OT, prophylactic antibiotics, VTE prophylaxis, and discharge planning. The patient is planning to be discharged home with outpatient PT.   The patient acknowledged the explanation, agreed to proceed with the plan and consent was signed.   Operative Plan: Right reverse total shoulder arthroplasty Discharge Medications: Standard DVT Prophylaxis: aspirin Physical Therapy: SNF Special Discharge needs: Sling. IceMan   Vernetta HoneyCaroline N , PA-C  07/17/2022 5:20 PM

## 2022-07-18 ENCOUNTER — Ambulatory Visit (HOSPITAL_COMMUNITY): Payer: Medicare Other

## 2022-07-18 ENCOUNTER — Observation Stay (HOSPITAL_COMMUNITY)
Admission: RE | Admit: 2022-07-18 | Discharge: 2022-07-23 | Disposition: A | Discharge status: Skilled Nursing Facility | Payer: Medicare Other | Source: Ambulatory Visit | Attending: Orthopaedic Surgery | Admitting: Orthopaedic Surgery

## 2022-07-18 ENCOUNTER — Encounter (HOSPITAL_COMMUNITY)
Admission: RE | Disposition: A | Discharge status: Skilled Nursing Facility | Payer: Self-pay | Source: Ambulatory Visit | Attending: Orthopaedic Surgery

## 2022-07-18 ENCOUNTER — Ambulatory Visit (HOSPITAL_COMMUNITY): Payer: Medicare Other | Admitting: Physician Assistant

## 2022-07-18 ENCOUNTER — Other Ambulatory Visit: Payer: Self-pay

## 2022-07-18 ENCOUNTER — Encounter (HOSPITAL_COMMUNITY): Payer: Self-pay | Admitting: Orthopaedic Surgery

## 2022-07-18 ENCOUNTER — Ambulatory Visit (HOSPITAL_BASED_OUTPATIENT_CLINIC_OR_DEPARTMENT_OTHER): Payer: Medicare Other | Admitting: Physician Assistant

## 2022-07-18 DIAGNOSIS — M19011 Primary osteoarthritis, right shoulder: Secondary | ICD-10-CM | POA: Diagnosis not present

## 2022-07-18 DIAGNOSIS — Z7984 Long term (current) use of oral hypoglycemic drugs: Secondary | ICD-10-CM | POA: Insufficient documentation

## 2022-07-18 DIAGNOSIS — Z794 Long term (current) use of insulin: Secondary | ICD-10-CM | POA: Diagnosis not present

## 2022-07-18 DIAGNOSIS — J45909 Unspecified asthma, uncomplicated: Secondary | ICD-10-CM | POA: Diagnosis not present

## 2022-07-18 DIAGNOSIS — Z79899 Other long term (current) drug therapy: Secondary | ICD-10-CM | POA: Insufficient documentation

## 2022-07-18 DIAGNOSIS — M75101 Unspecified rotator cuff tear or rupture of right shoulder, not specified as traumatic: Secondary | ICD-10-CM | POA: Diagnosis not present

## 2022-07-18 DIAGNOSIS — Z7982 Long term (current) use of aspirin: Secondary | ICD-10-CM | POA: Diagnosis not present

## 2022-07-18 DIAGNOSIS — E119 Type 2 diabetes mellitus without complications: Secondary | ICD-10-CM | POA: Diagnosis not present

## 2022-07-18 DIAGNOSIS — I10 Essential (primary) hypertension: Secondary | ICD-10-CM

## 2022-07-18 DIAGNOSIS — Z87891 Personal history of nicotine dependence: Secondary | ICD-10-CM

## 2022-07-18 DIAGNOSIS — Z96653 Presence of artificial knee joint, bilateral: Secondary | ICD-10-CM | POA: Diagnosis not present

## 2022-07-18 DIAGNOSIS — E039 Hypothyroidism, unspecified: Secondary | ICD-10-CM | POA: Diagnosis not present

## 2022-07-18 DIAGNOSIS — Z96611 Presence of right artificial shoulder joint: Secondary | ICD-10-CM | POA: Insufficient documentation

## 2022-07-18 DIAGNOSIS — Z96612 Presence of left artificial shoulder joint: Secondary | ICD-10-CM | POA: Insufficient documentation

## 2022-07-18 HISTORY — PX: REVERSE SHOULDER ARTHROPLASTY: SHX5054

## 2022-07-18 LAB — CBC
HCT: 37.2 % (ref 36.0–46.0)
Hemoglobin: 12.9 g/dL (ref 12.0–15.0)
MCH: 28.5 pg (ref 26.0–34.0)
MCHC: 34.7 g/dL (ref 30.0–36.0)
MCV: 82.3 fL (ref 80.0–100.0)
Platelets: 157 10*3/uL (ref 150–400)
RBC: 4.52 MIL/uL (ref 3.87–5.11)
RDW: 16 % — ABNORMAL HIGH (ref 11.5–15.5)
WBC: 10.7 10*3/uL — ABNORMAL HIGH (ref 4.0–10.5)
nRBC: 0 % (ref 0.0–0.2)

## 2022-07-18 LAB — GLUCOSE, CAPILLARY
Glucose-Capillary: 156 mg/dL — ABNORMAL HIGH (ref 70–99)
Glucose-Capillary: 157 mg/dL — ABNORMAL HIGH (ref 70–99)
Glucose-Capillary: 203 mg/dL — ABNORMAL HIGH (ref 70–99)
Glucose-Capillary: 313 mg/dL — ABNORMAL HIGH (ref 70–99)
Glucose-Capillary: 213 mg/dL — ABNORMAL HIGH (ref 70–99)

## 2022-07-18 LAB — CREATININE, SERUM
Creatinine, Ser: 0.47 mg/dL (ref 0.44–1.00)
GFR, Estimated: >60 mL/min (ref >60)

## 2022-07-18 SURGERY — ARTHROPLASTY, SHOULDER, TOTAL, REVERSE
Anesthesia: General | Site: Shoulder | Laterality: Right

## 2022-07-18 MED ORDER — BENZONATATE 100 MG PO CAPS
100.0000 mg | ORAL_CAPSULE | Freq: Three times a day (TID) | ORAL | Status: DC
  Administered 2022-07-19 – 2022-07-23 (×9): 100 mg via ORAL
  Filled 2022-07-18 (×12): qty 1

## 2022-07-18 MED ORDER — CEFAZOLIN SODIUM-DEXTROSE 2-4 GM/100ML-% IV SOLN
2.0000 g | Freq: Four times a day (QID) | INTRAVENOUS | Status: AC
  Administered 2022-07-18 – 2022-07-19 (×3): 2 g via INTRAVENOUS
  Filled 2022-07-18 (×3): qty 100

## 2022-07-18 MED ORDER — METOPROLOL SUCCINATE ER 25 MG PO TB24
25.0000 mg | ORAL_TABLET | Freq: Every day | ORAL | Status: DC
  Administered 2022-07-18 – 2022-07-23 (×5): 25 mg via ORAL
  Filled 2022-07-18 (×6): qty 1

## 2022-07-18 MED ORDER — DOCUSATE SODIUM 100 MG PO CAPS
100.0000 mg | ORAL_CAPSULE | Freq: Two times a day (BID) | ORAL | Status: DC
  Administered 2022-07-19 – 2022-07-23 (×8): 100 mg via ORAL
  Filled 2022-07-18 (×10): qty 1

## 2022-07-18 MED ORDER — MELATONIN 5 MG PO TABS
5.0000 mg | ORAL_TABLET | Freq: Every evening | ORAL | Status: DC | PRN
  Administered 2022-07-21: 5 mg via ORAL
  Filled 2022-07-18: qty 1

## 2022-07-18 MED ORDER — LAMOTRIGINE 25 MG PO TABS
150.0000 mg | ORAL_TABLET | Freq: Every day | ORAL | Status: DC
  Administered 2022-07-18 – 2022-07-22 (×5): 150 mg via ORAL
  Filled 2022-07-18 (×5): qty 2

## 2022-07-18 MED ORDER — BISACODYL 10 MG RE SUPP: 10.0000 mg | Freq: Every day | RECTAL | Status: DC | PRN

## 2022-07-18 MED ORDER — ROCURONIUM BROMIDE 10 MG/ML (PF) SYRINGE
PREFILLED_SYRINGE | INTRAVENOUS | Status: AC
  Filled 2022-07-18: qty 10

## 2022-07-18 MED ORDER — ONDANSETRON HCL 4 MG/2ML IJ SOLN
INTRAMUSCULAR | Status: DC | PRN
  Administered 2022-07-18: 4 mg via INTRAVENOUS

## 2022-07-18 MED ORDER — PROPOFOL 10 MG/ML IV BOLUS
INTRAVENOUS | Status: AC
  Filled 2022-07-18: qty 20

## 2022-07-18 MED ORDER — DEXAMETHASONE SODIUM PHOSPHATE 10 MG/ML IJ SOLN
INTRAMUSCULAR | Status: DC | PRN
  Administered 2022-07-18: 10 mg via INTRAVENOUS

## 2022-07-18 MED ORDER — STERILE WATER FOR IRRIGATION IR SOLN
Status: DC | PRN
  Administered 2022-07-18: 2000 mL

## 2022-07-18 MED ORDER — TRAZODONE HCL 100 MG PO TABS
100.0000 mg | ORAL_TABLET | Freq: Every day | ORAL | Status: DC
  Administered 2022-07-18 – 2022-07-22 (×5): 100 mg via ORAL
  Filled 2022-07-18 (×5): qty 1

## 2022-07-18 MED ORDER — CELECOXIB 200 MG PO CAPS
200.0000 mg | ORAL_CAPSULE | Freq: Two times a day (BID) | ORAL | Status: DC
  Administered 2022-07-18 – 2022-07-23 (×10): 200 mg via ORAL
  Filled 2022-07-18 (×10): qty 1

## 2022-07-18 MED ORDER — ACETAMINOPHEN 500 MG PO TABS
1000.0000 mg | ORAL_TABLET | Freq: Three times a day (TID) | ORAL | Status: AC
  Administered 2022-07-18 – 2022-07-19 (×3): 1000 mg via ORAL
  Filled 2022-07-18 (×3): qty 2

## 2022-07-18 MED ORDER — LACTATED RINGERS IV SOLN: INTRAVENOUS | Status: DC

## 2022-07-18 MED ORDER — LIDOCAINE HCL (PF) 2 % IJ SOLN
INTRAMUSCULAR | Status: AC
  Filled 2022-07-18: qty 5

## 2022-07-18 MED ORDER — FENTANYL CITRATE PF 50 MCG/ML IJ SOSY: 25.0000 ug | PREFILLED_SYRINGE | INTRAMUSCULAR | Status: DC | PRN

## 2022-07-18 MED ORDER — PROPOFOL 10 MG/ML IV BOLUS
INTRAVENOUS | Status: DC | PRN
  Administered 2022-07-18: 150 mg via INTRAVENOUS

## 2022-07-18 MED ORDER — METHOCARBAMOL 1000 MG/10ML IJ SOLN: 500.0000 mg | Freq: Four times a day (QID) | INTRAVENOUS | Status: DC | PRN

## 2022-07-18 MED ORDER — NORTRIPTYLINE HCL 25 MG PO CAPS: 75.0000 mg | ORAL_CAPSULE | Freq: Every day | ORAL | Status: DC

## 2022-07-18 MED ORDER — BUPROPION HCL ER (XL) 150 MG PO TB24
300.0000 mg | ORAL_TABLET | Freq: Every day | ORAL | Status: DC
  Administered 2022-07-18 – 2022-07-23 (×6): 300 mg via ORAL
  Filled 2022-07-18 (×6): qty 2

## 2022-07-18 MED ORDER — CHLORHEXIDINE GLUCONATE 0.12 % MT SOLN
15.0000 mL | Freq: Once | OROMUCOSAL | Status: AC
  Administered 2022-07-18: 15 mL via OROMUCOSAL

## 2022-07-18 MED ORDER — FENTANYL CITRATE (PF) 100 MCG/2ML IJ SOLN
INTRAMUSCULAR | Status: DC | PRN
  Administered 2022-07-18: 50 ug via INTRAVENOUS
  Administered 2022-07-18 (×2): 25 ug via INTRAVENOUS

## 2022-07-18 MED ORDER — SUGAMMADEX SODIUM 200 MG/2ML IV SOLN
INTRAVENOUS | Status: DC | PRN
  Administered 2022-07-18: 200 mg via INTRAVENOUS

## 2022-07-18 MED ORDER — FENTANYL CITRATE PF 50 MCG/ML IJ SOSY
50.0000 ug | PREFILLED_SYRINGE | INTRAMUSCULAR | Status: DC
  Administered 2022-07-18: 50 ug via INTRAVENOUS
  Filled 2022-07-18: qty 2

## 2022-07-18 MED ORDER — OXYCODONE HCL 5 MG PO TABS
5.0000 mg | ORAL_TABLET | ORAL | Status: DC | PRN
  Administered 2022-07-20 – 2022-07-22 (×6): 10 mg via ORAL
  Filled 2022-07-18 (×4): qty 2

## 2022-07-18 MED ORDER — VANCOMYCIN HCL 1 G IV SOLR
INTRAVENOUS | Status: DC | PRN
  Administered 2022-07-18: 1000 mg

## 2022-07-18 MED ORDER — IRBESARTAN 150 MG PO TABS
150.0000 mg | ORAL_TABLET | Freq: Every day | ORAL | Status: DC
  Administered 2022-07-18 – 2022-07-23 (×5): 150 mg via ORAL
  Filled 2022-07-18 (×6): qty 1

## 2022-07-18 MED ORDER — DIPHENHYDRAMINE HCL 12.5 MG/5ML PO ELIX: 12.5000 mg | ORAL_SOLUTION | ORAL | Status: DC | PRN

## 2022-07-18 MED ORDER — MENTHOL 3 MG MT LOZG: 1.0000 | LOZENGE | OROMUCOSAL | Status: DC | PRN

## 2022-07-18 MED ORDER — DEXAMETHASONE SODIUM PHOSPHATE 10 MG/ML IJ SOLN
INTRAMUSCULAR | Status: AC
  Filled 2022-07-18: qty 1

## 2022-07-18 MED ORDER — ALBUTEROL SULFATE HFA 108 (90 BASE) MCG/ACT IN AERS: 2.0000 | INHALATION_SPRAY | Freq: Four times a day (QID) | RESPIRATORY_TRACT | Status: DC | PRN

## 2022-07-18 MED ORDER — PHENYLEPHRINE HCL-NACL 20-0.9 MG/250ML-% IV SOLN
INTRAVENOUS | Status: DC | PRN
  Administered 2022-07-18: 35 ug/min via INTRAVENOUS

## 2022-07-18 MED ORDER — ORAL CARE MOUTH RINSE: 15.0000 mL | Freq: Once | OROMUCOSAL | Status: AC

## 2022-07-18 MED ORDER — ACETAMINOPHEN 500 MG PO TABS
1000.0000 mg | ORAL_TABLET | Freq: Once | ORAL | Status: AC
  Administered 2022-07-18: 1000 mg via ORAL
  Filled 2022-07-18: qty 2

## 2022-07-18 MED ORDER — PHENOL 1.4 % MT LIQD: 1.0000 | OROMUCOSAL | Status: DC | PRN

## 2022-07-18 MED ORDER — COLCHICINE 0.6 MG PO TABS
0.6000 mg | ORAL_TABLET | Freq: Two times a day (BID) | ORAL | Status: DC
  Administered 2022-07-18 – 2022-07-23 (×10): 0.6 mg via ORAL
  Filled 2022-07-18 (×10): qty 1

## 2022-07-18 MED ORDER — FENTANYL CITRATE (PF) 100 MCG/2ML IJ SOLN
INTRAMUSCULAR | Status: AC
  Filled 2022-07-18: qty 2

## 2022-07-18 MED ORDER — ENOXAPARIN SODIUM 40 MG/0.4ML IJ SOSY
40.0000 mg | PREFILLED_SYRINGE | INTRAMUSCULAR | Status: DC
  Administered 2022-07-19 – 2022-07-23 (×5): 40 mg via SUBCUTANEOUS
  Filled 2022-07-18 (×5): qty 0.4

## 2022-07-18 MED ORDER — ONDANSETRON HCL 4 MG/2ML IJ SOLN: 4.0000 mg | Freq: Four times a day (QID) | INTRAMUSCULAR | Status: DC | PRN

## 2022-07-18 MED ORDER — LIDOCAINE 2% (20 MG/ML) 5 ML SYRINGE
INTRAMUSCULAR | Status: DC | PRN
  Administered 2022-07-18: 40 mg via INTRAVENOUS

## 2022-07-18 MED ORDER — ONDANSETRON HCL 4 MG/2ML IJ SOLN
INTRAMUSCULAR | Status: AC
  Filled 2022-07-18: qty 2

## 2022-07-18 MED ORDER — INSULIN ASPART 100 UNIT/ML IJ SOLN
0.0000 [IU] | Freq: Three times a day (TID) | INTRAMUSCULAR | Status: DC
  Administered 2022-07-18: 5 [IU] via SUBCUTANEOUS
  Administered 2022-07-19 (×2): 3 [IU] via SUBCUTANEOUS
  Administered 2022-07-19: 5 [IU] via SUBCUTANEOUS
  Administered 2022-07-20 (×2): 3 [IU] via SUBCUTANEOUS
  Administered 2022-07-20: 5 [IU] via SUBCUTANEOUS
  Administered 2022-07-21 (×2): 3 [IU] via SUBCUTANEOUS
  Administered 2022-07-21: 2 [IU] via SUBCUTANEOUS
  Administered 2022-07-22 (×2): 3 [IU] via SUBCUTANEOUS
  Administered 2022-07-22: 2 [IU] via SUBCUTANEOUS

## 2022-07-18 MED ORDER — SODIUM CHLORIDE 0.9 % IR SOLN
Status: DC | PRN
  Administered 2022-07-18: 3000 mL

## 2022-07-18 MED ORDER — ONDANSETRON HCL 4 MG PO TABS: 4.0000 mg | ORAL_TABLET | Freq: Four times a day (QID) | ORAL | Status: DC | PRN

## 2022-07-18 MED ORDER — CEFAZOLIN SODIUM-DEXTROSE 2-4 GM/100ML-% IV SOLN
2.0000 g | INTRAVENOUS | Status: AC
  Administered 2022-07-18: 2 g via INTRAVENOUS
  Filled 2022-07-18: qty 100

## 2022-07-18 MED ORDER — ALBUTEROL SULFATE (2.5 MG/3ML) 0.083% IN NEBU: 2.5000 mg | INHALATION_SOLUTION | Freq: Four times a day (QID) | RESPIRATORY_TRACT | Status: DC | PRN

## 2022-07-18 MED ORDER — OXYCODONE HCL 5 MG PO TABS
10.0000 mg | ORAL_TABLET | ORAL | Status: DC | PRN
  Administered 2022-07-18 – 2022-07-19 (×4): 15 mg via ORAL
  Administered 2022-07-20: 10 mg via ORAL
  Administered 2022-07-20: 15 mg via ORAL
  Administered 2022-07-21: 10 mg via ORAL
  Filled 2022-07-18 (×2): qty 3
  Filled 2022-07-18 (×3): qty 2
  Filled 2022-07-18 (×3): qty 3

## 2022-07-18 MED ORDER — PANTOPRAZOLE SODIUM 40 MG PO TBEC
40.0000 mg | DELAYED_RELEASE_TABLET | Freq: Every day | ORAL | Status: DC
  Administered 2022-07-18 – 2022-07-22 (×5): 40 mg via ORAL
  Filled 2022-07-18 (×5): qty 1

## 2022-07-18 MED ORDER — FAMOTIDINE 20 MG PO TABS
20.0000 mg | ORAL_TABLET | Freq: Two times a day (BID) | ORAL | Status: DC
  Administered 2022-07-19 – 2022-07-23 (×9): 20 mg via ORAL
  Filled 2022-07-18 (×10): qty 1

## 2022-07-18 MED ORDER — BUPIVACAINE LIPOSOME 1.3 % IJ SUSP
INTRAMUSCULAR | Status: DC | PRN
  Administered 2022-07-18: 10 mL via PERINEURAL

## 2022-07-18 MED ORDER — BUPIVACAINE HCL (PF) 0.5 % IJ SOLN
INTRAMUSCULAR | Status: DC | PRN
  Administered 2022-07-18: 15 mL via PERINEURAL

## 2022-07-18 MED ORDER — IPRATROPIUM BROMIDE 0.02 % IN SOLN
0.5000 mg | Freq: Four times a day (QID) | RESPIRATORY_TRACT | Status: DC
  Filled 2022-07-18: qty 2.5

## 2022-07-18 MED ORDER — ATORVASTATIN CALCIUM 20 MG PO TABS
20.0000 mg | ORAL_TABLET | Freq: Every evening | ORAL | Status: DC
  Administered 2022-07-18 – 2022-07-22 (×5): 20 mg via ORAL
  Filled 2022-07-18 (×5): qty 1

## 2022-07-18 MED ORDER — NORTRIPTYLINE HCL 25 MG PO CAPS
75.0000 mg | ORAL_CAPSULE | Freq: Every day | ORAL | Status: DC
  Administered 2022-07-18 – 2022-07-22 (×5): 75 mg via ORAL
  Filled 2022-07-18 (×5): qty 3

## 2022-07-18 MED ORDER — VANCOMYCIN HCL 1000 MG IV SOLR
INTRAVENOUS | Status: AC
  Filled 2022-07-18: qty 20

## 2022-07-18 MED ORDER — 0.9 % SODIUM CHLORIDE (POUR BTL) OPTIME
TOPICAL | Status: DC | PRN
  Administered 2022-07-18: 1000 mL

## 2022-07-18 MED ORDER — METOCLOPRAMIDE HCL 5 MG PO TABS: 5.0000 mg | ORAL_TABLET | Freq: Three times a day (TID) | ORAL | Status: DC | PRN

## 2022-07-18 MED ORDER — MIDAZOLAM HCL 2 MG/2ML IJ SOLN
1.0000 mg | INTRAMUSCULAR | Status: DC
  Filled 2022-07-18: qty 2

## 2022-07-18 MED ORDER — AMLODIPINE BESYLATE 5 MG PO TABS
2.5000 mg | ORAL_TABLET | Freq: Every day | ORAL | Status: DC
  Administered 2022-07-18 – 2022-07-23 (×5): 2.5 mg via ORAL
  Filled 2022-07-18 (×6): qty 1

## 2022-07-18 MED ORDER — METOCLOPRAMIDE HCL 5 MG/ML IJ SOLN: 5.0000 mg | Freq: Three times a day (TID) | INTRAMUSCULAR | Status: DC | PRN

## 2022-07-18 MED ORDER — PHENYLEPHRINE 80 MCG/ML (10ML) SYRINGE FOR IV PUSH (FOR BLOOD PRESSURE SUPPORT)
PREFILLED_SYRINGE | INTRAVENOUS | Status: AC
  Filled 2022-07-18: qty 10

## 2022-07-18 MED ORDER — LEVOTHYROXINE SODIUM 50 MCG PO TABS
50.0000 ug | ORAL_TABLET | Freq: Every day | ORAL | Status: DC
  Administered 2022-07-19 – 2022-07-23 (×5): 50 ug via ORAL
  Filled 2022-07-18 (×5): qty 1

## 2022-07-18 MED ORDER — HYDROMORPHONE HCL 1 MG/ML IJ SOLN
0.5000 mg | INTRAMUSCULAR | Status: DC | PRN
  Administered 2022-07-20 – 2022-07-22 (×4): 0.5 mg via INTRAVENOUS
  Filled 2022-07-18 (×4): qty 0.5

## 2022-07-18 MED ORDER — POLYETHYLENE GLYCOL 3350 17 G PO PACK: 17.0000 g | PACK | Freq: Every day | ORAL | Status: DC | PRN

## 2022-07-18 MED ORDER — TOBRAMYCIN SULFATE 1.2 G IJ SOLR
INTRAMUSCULAR | Status: AC
  Filled 2022-07-18: qty 1.2

## 2022-07-18 MED ORDER — ROCURONIUM BROMIDE 10 MG/ML (PF) SYRINGE
PREFILLED_SYRINGE | INTRAVENOUS | Status: DC | PRN
  Administered 2022-07-18: 50 mg via INTRAVENOUS
  Administered 2022-07-18: 20 mg via INTRAVENOUS

## 2022-07-18 MED ORDER — MAGNESIUM CITRATE PO SOLN: 1.0000 | Freq: Once | ORAL | Status: DC | PRN

## 2022-07-18 MED ORDER — TRANEXAMIC ACID-NACL 1000-0.7 MG/100ML-% IV SOLN
1000.0000 mg | INTRAVENOUS | Status: AC
  Administered 2022-07-18: 1000 mg via INTRAVENOUS
  Filled 2022-07-18: qty 100

## 2022-07-18 MED ORDER — IPRATROPIUM BROMIDE 0.02 % IN SOLN: 0.5000 mg | Freq: Four times a day (QID) | RESPIRATORY_TRACT | Status: DC | PRN

## 2022-07-18 MED ORDER — ONDANSETRON HCL 4 MG/2ML IJ SOLN: 4.0000 mg | Freq: Once | INTRAMUSCULAR | Status: DC | PRN

## 2022-07-18 MED ORDER — INSULIN DETEMIR 100 UNIT/ML ~~LOC~~ SOLN
17.0000 [IU] | Freq: Every day | SUBCUTANEOUS | Status: DC
  Administered 2022-07-18 – 2022-07-22 (×5): 17 [IU] via SUBCUTANEOUS
  Filled 2022-07-18 (×6): qty 0.17

## 2022-07-18 MED ORDER — METHOCARBAMOL 500 MG PO TABS
500.0000 mg | ORAL_TABLET | Freq: Four times a day (QID) | ORAL | Status: DC | PRN
  Administered 2022-07-19 – 2022-07-22 (×6): 500 mg via ORAL
  Filled 2022-07-18 (×6): qty 1

## 2022-07-18 MED ORDER — PHENYLEPHRINE 80 MCG/ML (10ML) SYRINGE FOR IV PUSH (FOR BLOOD PRESSURE SUPPORT)
PREFILLED_SYRINGE | INTRAVENOUS | Status: DC | PRN
  Administered 2022-07-18: 160 ug via INTRAVENOUS

## 2022-07-18 SURGICAL SUPPLY — 64 items
AUG BASEPLATE 15DEG 25 WEDGE (Joint) ×1 IMPLANT
AUGMENT BASEPLATE 15DEG 25 WDG (Joint) IMPLANT
BAG COUNTER SPONGE SURGICOUNT (BAG) ×1 IMPLANT
BIT DRILL 3.2 PERIPHERAL SCREW (BIT) IMPLANT
BLADE SAW SGTL 73X25 THK (BLADE) ×1 IMPLANT
CHLORAPREP W/TINT 26 (MISCELLANEOUS) ×2 IMPLANT
CLSR STERI-STRIP ANTIMIC 1/2X4 (GAUZE/BANDAGES/DRESSINGS) ×1 IMPLANT
COOLER ICEMAN CLASSIC (MISCELLANEOUS) IMPLANT
COVER BACK TABLE 60X90IN (DRAPES) ×1 IMPLANT
COVER SURGICAL LIGHT HANDLE (MISCELLANEOUS) ×1 IMPLANT
DRAPE C-ARM 42X120 X-RAY (DRAPES) IMPLANT
DRAPE INCISE IOBAN 66X45 STRL (DRAPES) ×1 IMPLANT
DRAPE ORTHO SPLIT 77X108 STRL (DRAPES) ×2
DRAPE SHEET LG 3/4 BI-LAMINATE (DRAPES) ×2 IMPLANT
DRAPE SURG ORHT 6 SPLT 77X108 (DRAPES) ×2 IMPLANT
DRSG AQUACEL AG ADV 3.5X 6 (GAUZE/BANDAGES/DRESSINGS) ×1 IMPLANT
ELECT BLADE TIP CTD 4 INCH (ELECTRODE) ×1 IMPLANT
ELECT PENCIL ROCKER SW 15FT (MISCELLANEOUS) IMPLANT
ELECT REM PT RETURN 15FT ADLT (MISCELLANEOUS) ×1 IMPLANT
FACESHIELD WRAPAROUND (MASK) ×2 IMPLANT
FACESHIELD WRAPAROUND OR TEAM (MASK) ×2 IMPLANT
GLENOSPHERE REV SHOULDER 36 (Joint) IMPLANT
GLOVE BIO SURGEON STRL SZ 6.5 (GLOVE) ×2 IMPLANT
GLOVE BIOGEL PI IND STRL 6.5 (GLOVE) ×1 IMPLANT
GLOVE BIOGEL PI IND STRL 8 (GLOVE) ×1 IMPLANT
GLOVE ECLIPSE 8.0 STRL XLNG CF (GLOVE) ×2 IMPLANT
GOWN STRL REUS W/ TWL LRG LVL3 (GOWN DISPOSABLE) ×2 IMPLANT
GOWN STRL REUS W/TWL LRG LVL3 (GOWN DISPOSABLE) ×2
GUIDEWIRE GLENOID 2.5X220 (WIRE) IMPLANT
HANDPIECE INTERPULSE COAX TIP (DISPOSABLE) ×1
INSERT HUM AEQ SHLD REV 36 +9 (Insert) IMPLANT
KIT BASIN OR (CUSTOM PROCEDURE TRAY) ×1 IMPLANT
KIT STABILIZATION SHOULDER (MISCELLANEOUS) ×1 IMPLANT
KIT TURNOVER KIT A (KITS) IMPLANT
MANIFOLD NEPTUNE II (INSTRUMENTS) ×1 IMPLANT
NDL MAYO CATGUT SZ4 TPR NDL (NEEDLE) IMPLANT
NEEDLE MAYO CATGUT SZ4 (NEEDLE) IMPLANT
NS IRRIG 1000ML POUR BTL (IV SOLUTION) ×1 IMPLANT
PACK SHOULDER (CUSTOM PROCEDURE TRAY) ×1 IMPLANT
PAD COLD SHLDR WRAP-ON (PAD) IMPLANT
RESTRAINT HEAD UNIVERSAL NS (MISCELLANEOUS) ×1 IMPLANT
SCREW 5.5X22 (Screw) IMPLANT
SCREW 5.5X26 (Screw) IMPLANT
SCREW BONE THREAD 6.5X35 (Screw) IMPLANT
SCREW PERIPHERAL 5.0X34 (Screw) IMPLANT
SET HNDPC FAN SPRY TIP SCT (DISPOSABLE) ×1 IMPLANT
SPONGE T-LAP 4X18 ~~LOC~~+RFID (SPONGE) ×1 IMPLANT
STEM HUMERAL 3B LONG 98 (Stem) IMPLANT
STEM HUMERAL SZ 3B LONG 98MM (Stem) ×1 IMPLANT
STEM HUMERAL SZ4BX104 132.5D (Joint) IMPLANT
SUCTION FRAZIER HANDLE 12FR (TUBING)
SUCTION TUBE FRAZIER 12FR DISP (TUBING) IMPLANT
SUT ETHIBOND 2 V 37 (SUTURE) ×1 IMPLANT
SUT ETHIBOND NAB CT1 #1 30IN (SUTURE) ×1 IMPLANT
SUT FIBERWIRE #5 38 CONV NDL (SUTURE) ×2
SUT MNCRL AB 4-0 PS2 18 (SUTURE) ×1 IMPLANT
SUT VIC AB 0 CT1 36 (SUTURE) IMPLANT
SUT VIC AB 3-0 SH 27 (SUTURE) ×1
SUT VIC AB 3-0 SH 27X BRD (SUTURE) ×1 IMPLANT
SUTURE FIBERWR #5 38 CONV NDL (SUTURE) IMPLANT
TOWEL OR 17X26 10 PK STRL BLUE (TOWEL DISPOSABLE) ×1 IMPLANT
TRAY SHOULDER REV OFFSET 1.5 (Joint) IMPLANT
TUBE SUCTION HIGH CAP CLEAR NV (SUCTIONS) ×1 IMPLANT
WATER STERILE IRR 1000ML POUR (IV SOLUTION) ×2 IMPLANT

## 2022-07-18 NOTE — Anesthesia Preprocedure Evaluation (Addendum)
Anesthesia Evaluation  Patient identified by MRN, date of birth, ID band Patient awake    Reviewed: Allergy & Precautions, NPO status , Patient's Chart, lab work & pertinent test results, reviewed documented beta blocker date and time   History of Anesthesia Complications (+) PONV, Emergence Delirium and history of anesthetic complications  Airway Mallampati: III  TM Distance: >3 FB Neck ROM: Full    Dental  (+) Dental Advisory Given, Missing, Chipped, Poor Dentition, Edentulous Upper   Pulmonary asthma , sleep apnea and Continuous Positive Airway Pressure Ventilation , former smoker   Pulmonary exam normal        Cardiovascular hypertension, Pt. on medications and Pt. on home beta blockers Normal cardiovascular exam+ dysrhythmias      Neuro/Psych  Headaches PSYCHIATRIC DISORDERS  Depression       GI/Hepatic Neg liver ROS, hiatal hernia,GERD  Medicated and Controlled,,  Endo/Other  diabetes, Type 2, Insulin Dependent, Oral Hypoglycemic AgentsHypothyroidism   Obesity   Renal/GU negative Renal ROS     Musculoskeletal  (+) Arthritis ,    Abdominal   Peds  Hematology  (+) Blood dyscrasia, anemia  Alpha thalassemia minor    Anesthesia Other Findings Familial Mediterranean fever   Reproductive/Obstetrics                             Anesthesia Physical Anesthesia Plan  ASA: 3  Anesthesia Plan: General   Post-op Pain Management: Regional block* and Tylenol PO (pre-op)*   Induction: Intravenous  PONV Risk Score and Plan: 4 or greater and Treatment may vary due to age or medical condition, Ondansetron, Propofol infusion, Midazolam and Dexamethasone  Airway Management Planned: Oral ETT  Additional Equipment: None  Intra-op Plan:   Post-operative Plan: Extubation in OR  Informed Consent: I have reviewed the patients History and Physical, chart, labs and discussed the procedure  including the risks, benefits and alternatives for the proposed anesthesia with the patient or authorized representative who has indicated his/her understanding and acceptance.     Dental advisory given  Plan Discussed with: CRNA and Anesthesiologist  Anesthesia Plan Comments:        Anesthesia Quick Evaluation

## 2022-07-18 NOTE — Anesthesia Postprocedure Evaluation (Signed)
Anesthesia Post Note  Patient: Film/video editor  Procedure(s) Performed: REVERSE SHOULDER ARTHROPLASTY (Right: Shoulder)     Patient location during evaluation: PACU Anesthesia Type: General Level of consciousness: awake and alert Pain management: pain level controlled Vital Signs Assessment: post-procedure vital signs reviewed and stable Respiratory status: spontaneous breathing, nonlabored ventilation and respiratory function stable Cardiovascular status: stable and blood pressure returned to baseline Anesthetic complications: no   No notable events documented.  Last Vitals:  Vitals:   07/18/22 1245 07/18/22 1300  BP: 120/64 (!) 87/42  Pulse: 84 82  Resp: 20 20  Temp:    SpO2: 93% 96%    Last Pain:  Vitals:   07/18/22 1300  TempSrc:   PainSc: 0-No pain                 Beryle Lathe

## 2022-07-18 NOTE — Anesthesia Procedure Notes (Signed)
Anesthesia Regional Block: Interscalene brachial plexus block   Pre-Anesthetic Checklist: , timeout performed,  Correct Patient, Correct Site, Correct Laterality,  Correct Procedure, Correct Position, site marked,  Risks and benefits discussed,  Surgical consent,  Pre-op evaluation,  At surgeon's request and post-op pain management  Laterality: Right  Prep: chloraprep       Needles:  Injection technique: Single-shot  Needle Type: Echogenic Needle     Needle Length: 5cm  Needle Gauge: 21     Additional Needles:   Narrative:  Start time: 07/18/2022 9:18 AM End time: 07/18/2022 9:21 AM Injection made incrementally with aspirations every 5 mL.  Performed by: Personally  Anesthesiologist: Beryle Lathe, MD  Additional Notes: No pain on injection. No increased resistance to injection. Injection made in 5cc increments. Good needle visualization. Patient tolerated the procedure well.

## 2022-07-18 NOTE — Transfer of Care (Signed)
Immediate Anesthesia Transfer of Care Note  Patient: Sumner Boast  Procedure(s) Performed: REVERSE SHOULDER ARTHROPLASTY (Right: Shoulder)  Patient Location: PACU  Anesthesia Type:General  Level of Consciousness: awake, alert , and oriented  Airway & Oxygen Therapy: Patient Spontanous Breathing and Patient connected to face mask oxygen  Post-op Assessment: Report given to RN and Post -op Vital signs reviewed and stable  Post vital signs: Reviewed and stable  Last Vitals:  Vitals Value Taken Time  BP 122/73 07/18/22 1219  Temp    Pulse 82 07/18/22 1220  Resp 13 07/18/22 1220  SpO2 100 % 07/18/22 1220  Vitals shown include unvalidated device data.  Last Pain:  Vitals:   07/18/22 0921  TempSrc:   PainSc: 0-No pain         Complications: No notable events documented.

## 2022-07-18 NOTE — Interval H&P Note (Signed)
All questions answered, patient wants to proceed with procedure. ? ?

## 2022-07-18 NOTE — Op Note (Signed)
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  Patient's bone quality was extremely poor and we had to make a significant head cut in order to appropriately tension the tissues.  Based on her previous lower extremity amputation she will have to use the construct early and we used a retentive polyethylene to try and assist with this and placed her implants in slightly more tight than typical.  There is significant erosion of the acromion and she is at high risk of acromial stress fracture as well as component dislodgment.  Of note we did have to switch from a size 3 long stem to a size 4 long stem as the bone was so significantly soft that our attempt to use a 3 longstem resulted in an implant that was not well-fixed enough to allow early weightbearing in a patient who is an amputee.  Stem was wasted.  Post-operative plan: The patient will be NWB in sling until nerve block wears off and then okay to use arm for ADLs as well as transfers.  The patient will be admitted for eventual discharge to a skilled nursing facility as the patient has an amputation upper right leg.  DVT prophylaxis Lovenox 40 mg/day until mobilizing and then consider transition in clinic to alternative medicines.  Pain control with PRN pain medication preferring oral medicines.  Follow up plan will be scheduled in approximately 7 days for incision check and XR.  Physical therapy to start for mobility purposes now but no shoulder range of motion encouraged till 4 weeks.  Implants: Tornier long flex size 4 stem, 0 low offset tray, +9 retentive polyethylene, 36 standard glenosphere, 25 full wedge baseplate with a 35 center screw and 4 peripheral screws.  Post-Op Diagnosis:  Same Surgeons:Primary: Bjorn Pippin, MD Assistants:Caroline McBane PA-C Location: [facility] ROOM 06 Anesthesia: General with Exparel Interscalene Antibiotics: Ancef 2g preop, Vancomycin 1000mg  locally Tourniquet time: None Estimated Blood Loss: 100 Complications: None Specimens: None Implants: Implant Name Type Inv. Item Serial No. Manufacturer Lot No. LRB No. Used Action  AUG BASEPLATE 15DEG 25 WEDGE - Joint AUG BASEPLATE 15DEG 25 WEDGE [id number] [employer]  Right 1 Implanted  GLENOSPHERE REV SHOULDER 36 - [id number] Joint GLENOSPHERE REV SHOULDER 36 [id number] [employer]  Right 1 Implanted  SCREW BONE THREAD 6.5X35 - [id number] Screw SCREW BONE THREAD 6.5X35  [employer]  Right 1 Implanted  SCREW PERIPHERAL 5.0X34 - [id number] Screw SCREW PERIPHERAL 5.0X34  [employer]  Right 2 Implanted  SCREW 5.5X22 - [id number] Screw SCREW 5.5X22  [employer]  Right 1 Implanted  SCREW 5.5X26 - [id number] Screw SCREW 5.5X26  [employer]  Right 1 Implanted  TRAY SHOULDER REV OFFSET 1.5 - [id number] Joint TRAY SHOULDER REV OFFSET 1.5 [id number] [employer]  Right 1 Implanted  STEM HUMERAL [id number] 132.5D - [id number] Joint STEM HUMERAL [id number] 132.5D [id number] [employer]  Right 1 Implanted    Indications for Surgery:   Shirley Mcdonald is a 71 y.o. 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  She uses arm for mobility and was unable to continue to do so secondary to pain.  Benefits and risks of operative and nonoperative management were discussed prior to surgery with patient/guardian(s) and informed consent form was completed.  Infection and need for  further surgery were discussed as was prosthetic stability and cuff issues.  We additionally specifically discussed risks of axillary nerve injury, infection, periprosthetic fracture, continued pain and longevity of implants prior to beginning procedure.      Procedure:   The patient was identified  in the preoperative holding area where the surgical site was marked. Block placed by anesthesia with exparel.  The patient was taken to the OR where a procedural timeout was called and the above noted anesthesia was induced.  The patient was positioned beachchair on allen table with spider arm positioner.  Preoperative antibiotics were dosed.  The patient's right shoulder was prepped and draped in the usual sterile fashion.  A second preoperative timeout was called.       Standard deltopectoral approach was performed with a #10 blade. We dissected down to the subcutaneous tissues and the cephalic vein was taken laterally with the deltoid. Clavipectoral fascia was incised in line with the incision. Deep retractors were placed. The long of the biceps tendon was identified and there was significant tenosynovitis present.  Tenodesis was performed to the pectoralis tendon with #2 Ethibond. The remaining biceps was followed up into the rotator interval where it was released.   There is very little subscapularis left and the anterior capsule was treated as if it was the subscapularis.  We peeled it off the anterior surface of the humerus.  We continued releasing the capsule directly off of the osteophytes inferiorly all the way around the corner. This allowed [place] to dislocate the humeral head.   The humeral head had evidence of severe osteoarthritic wear with full-thickness cartilage loss and exposed subchondral bone. There was significant flattening of the humeral head.  Significant proximal migration with thinning of the acromion.  The rotator cuff was carefully examined and noted to be irreperably torn.  The decision was confirmed that a reverse total shoulder was indicated for this patient.  There were osteophytes along the inferior humeral neck. The osteophytes were removed with an osteotome and a rongeur.  Osteophytes were removed with a rongeur and an osteotome and the anatomic neck was well visualized.      A humeral cutting guide was inserted down the intramedullary canal. The version was set at 20 of retroversion. Humeral osteotomy was performed with an oscillating saw. The head fragment was passed off the back table. A starter awl was used to open the humeral canal. We next used T-handle straight sound reamers to ream up to an appropriate fit. A chisel was used to remove proximal humeral bone. We then broached starting with a size one broach and broaching up to 5 which obtained an appropriate fit. The broach handle was removed. A cut protector was placed. The broach handle was removed and a cut protector was placed. The humerus was retracted posteriorly and we turned our attention to glenoid exposure.  The subcoracoid recess was so scarred that it was unable to support allow for a tug test.  We thus stayed away from the inferior axillary recess where the axillary nerve would otherwise stay in place blunt retractors.  An anterior deltoid retractor was then placed as well as a small Hohmann retractor superiorly.   The glenoid was inspected and had evidence of severe osteoarthritic wear with full-thickness cartilage loss and exposed subchondral bone.   At this point we felt based on blueprint templating that a full wedge augment was necessary.  We began by using a full wedge guide to place our center pin as  was templated.  We had good position of this pin and we proceeded with our starter center drill.  This allowed for [place] to use the 15 degree full wedge reamer obtaining circumferential witness marks and good bone preparation for ingrowth.  At this point we proceeded with our center drill and had an intact vault.  We then drilled our center screw to a length of 35 mm.    We selected a 6.5 mm x 35 mm screw and the full wedge baseplate which was placed in the same orientation as our reaming.  We double checked that we had good apposition of the base plate to bone and then proceeded to place 3 locking screws  and one nonlocking screw as is typical.  Next a 36 mm glenosphere was selected and impacted onto the baseplate. The center screw was tightened.  We turned attention back to the humeral side. The cut protector was removed.  It was clear that the overall tension of the system was too high.  We then tried to recess a size 3 long stem and made a auxiliary head cut.  We trialed with this and had reasonable fit and tension.  We trialed with multiple size tray and polyethylene options and selected a 9 which provided good stability and range of motion without excess soft tissue tension. The offset was dialed in to match the normal anatomy. The shoulder was trialed.  There was good ROM in all planes and the shoulder was stable with no inferior translation.  The real humeral implants were opened after again confirming sizes.  The trial was removed. #5 Fiberwire x2 sutures passed through the humeral neck for anterior capsule repair. The humeral component was press-fit however did not obtain a good fit due to compression of the proximal metaphyseal bone.  We tried to bone graft with bone from the head however it still had a suboptimal fit for patient who needed to use her arm for ambulation early.  We thus switched to a size 4 long stem and wasted our size 3 stem.  A +0 low offset tray was selected and impacted onto the stem.  A 36+9 retentive polyethylene liner was impacted onto the stem.  The joint was reduced and thoroughly irrigated with pulsatile lavage.  Anterior capsule was repaired back with #5 Fiberwire sutures through bone tunnels. Hemostasis was obtained. The deltopectoral interval was reapproximated with #1 Ethibond. The subcutaneous tissues were closed with 2-0 Vicryl and the skin was closed with running monocryl.    The wounds were cleaned and dried and an Aquacel dressing was placed. The drapes taken down. The arm was placed into sling with abduction pillow. Patient was awakened, extubated, and transferred  to the recovery room in stable condition. There were no intraoperative complications. The sponge, needle, and attention counts were  correct at the end of the case.       Alfonse Alpers, PA-C, present and scrubbed throughout the case, critical for completion in a timely fashion, and for retraction, instrumentation, closure.

## 2022-07-18 NOTE — Anesthesia Procedure Notes (Signed)
Procedure Name: Intubation Date/Time: 07/18/2022 10:41 AM  Performed by: Maxwell Caul, CRNAPre-anesthesia Checklist: Patient identified, Emergency Drugs available, Suction available and Patient being monitored Patient Re-evaluated:Patient Re-evaluated prior to induction Oxygen Delivery Method: Circle system utilized Preoxygenation: Pre-oxygenation with 100% oxygen Induction Type: IV induction Ventilation: Mask ventilation without difficulty Laryngoscope Size: Mac and 4 Grade View: Grade I Tube type: Oral Tube size: 7.0 mm Number of attempts: 1 Airway Equipment and Method: Stylet Placement Confirmation: ETT inserted through vocal cords under direct vision, positive ETCO2 and breath sounds checked- equal and bilateral Secured at: 22 cm Tube secured with: Tape Dental Injury: Teeth and Oropharynx as per pre-operative assessment

## 2022-07-19 ENCOUNTER — Encounter (HOSPITAL_COMMUNITY): Payer: Self-pay | Admitting: Orthopaedic Surgery

## 2022-07-19 DIAGNOSIS — M19011 Primary osteoarthritis, right shoulder: Secondary | ICD-10-CM | POA: Diagnosis not present

## 2022-07-19 LAB — GLUCOSE, CAPILLARY
Glucose-Capillary: 188 mg/dL — ABNORMAL HIGH (ref 70–99)
Glucose-Capillary: 176 mg/dL — ABNORMAL HIGH (ref 70–99)
Glucose-Capillary: 208 mg/dL — ABNORMAL HIGH (ref 70–99)
Glucose-Capillary: 209 mg/dL — ABNORMAL HIGH (ref 70–99)

## 2022-07-19 MED ORDER — OXYCODONE HCL 5 MG PO TABS: ORAL_TABLET | ORAL | 0 refills | Status: DC

## 2022-07-19 MED ORDER — ONDANSETRON HCL 4 MG PO TABS: 4.0000 mg | ORAL_TABLET | Freq: Three times a day (TID) | ORAL | 1 refills | Status: AC | PRN

## 2022-07-19 MED ORDER — ACETAMINOPHEN 500 MG PO TABS: 1000.0000 mg | ORAL_TABLET | Freq: Three times a day (TID) | ORAL | 0 refills | Status: AC

## 2022-07-19 MED ORDER — ENOXAPARIN SODIUM 40 MG/0.4ML IJ SOSY: 40.0000 mg | PREFILLED_SYRINGE | INTRAMUSCULAR | 0 refills | Status: AC

## 2022-07-19 MED ORDER — DIPHENHYDRAMINE HCL 25 MG PO CAPS: 25.0000 mg | ORAL_CAPSULE | Freq: Four times a day (QID) | ORAL | 0 refills | Status: DC | PRN

## 2022-07-19 NOTE — Progress Notes (Signed)
   ORTHOPAEDIC PROGRESS NOTE  s/p Procedure(s): REVERSE SHOULDER ARTHROPLASTY on 07/18/2022 with Dr. Everardo Pacific   SUBJECTIVE: Reports moderate pain about operative site. Nerve block starting to wear off. No chest pain. No SOB. No nausea/vomiting. No other complaints. Plan to discharge to SNF  OBJECTIVE: PE: General: sitting up in hospital bed, NAD RUE: Dressing CDI and sling well fitting,  full and painless ROM throughout hand with [facility] of 0.  Axillary nerve sensation/motor altered in setting of block and unable to be fully tested.  Distal motor and sensory altered in setting of block.   Vitals:   07/18/22 2216 07/19/22 0545  BP: 117/73 106/60  Pulse: (!) 110 83  Resp: 18 19  Temp: 98.5 F (36.9 C) (!) 97.4 F (36.3 C)  SpO2: 94% 95%   Stable post-op images.   ASSESSMENT: Shirley Mcdonald is a 71 y.o. female doing well postoperatively. POD#1  PLAN: Weightbearing:  - NWB in sling until nerve block wears off  - then okay to use arm for ADLs as well as transfers  - physical therapy to start for mobility purposes now but no shoulder range of motion encouraged until 4 weeks after surgery Insicional and dressing care: Reinforce dressings as needed Orthopedic device(s):  Sling Showering: post-op day #2 with assistance VTE prophylaxis: Lovenox Pain control: PRN pain medications, minimize narcotics as able Follow - up plan: 1 week in office Dispo: SNF. PT/OT evals pending. Patient was referred to [facility] in [place] prior to surgery. TOC following. Appreciate their assistance.  Contact information:  Weekdays 8-5 Dr. Ramond Marrow, Alfonse Alpers PA-C , After hours and holidays please check [email or web address] for group call information for [facility]   Alfonse Alpers, PA-C 07/19/2022

## 2022-07-19 NOTE — TOC Initial Note (Addendum)
Transition of Care [facility]) - Initial/Assessment Note    Patient Details  Name: Shirley Mcdonald MRN: [id number] Date of Birth: [birth year]  Transition of Care [facility]) CM/SW Contact:    Beckie Busing, RN Phone Number:[phone]  07/19/2022, 11:03 AM  Clinical Narrative:                 [facility] consult received for (Plan to discharge to SNF tomorrow AM. [facility] in [place] ) Patient at bedside to discuss disposition plan with patient. CM introduced self and role. Patient states that she is from home where she lives independently . Patient states that she feels that she will need SNF to manage post surgery. CM has explained the SNF placement process. Patient will need to be assessed by therapy for recommendations and then [facility] can start disposition planning based on those recommendations.  Patient verbalized understanding. CM provided patient with a list of SNF's with the understanding that CM will not initiate SNF workup until recommendations have been entered per therapy. MD will need to enter therapy orders for evaluation and recommendations. TOC following.  1600 Orders have been entered for PT evaluation. Awaiting PT evaluation for disposition recommendation.    Expected Discharge Plan: Skilled Nursing Facility Barriers to Discharge: Continued Medical Work up (awaiting therapy recommendation for disposition plan)   Patient Goals and CMS Choice Patient states their goals for this hospitalization and ongoing recovery are:: Wants to get rehab to be able to return home safely CMS Medicare.gov Compare Post Acute Care list provided to:: Patient Choice offered to / list presented to : Patient  Expected Discharge Plan and Services Expected Discharge Plan: Skilled Nursing Facility     Post Acute Care Choice: Skilled Nursing Facility (pending therapy recommendations) Living arrangements for the past 2 months: Apartment                 DME Arranged: N/A DME Agency: NA  (pending assessment)                  Prior Living Arrangements/Services Living arrangements for the past 2 months: Apartment Lives with:: Self Patient language and need for interpreter reviewed:: Yes Do you feel safe going back to the place where you live?: No   Feels like she needs some rehab to be safe  Need for Family Participation in Patient Care: Yes (Comment) Care giver support system in place?: Yes (comment) Current home services:  (n/a) Criminal Activity/Legal Involvement Pertinent to Current Situation/Hospitalization: No - Comment as needed  Activities of Daily Living      Permission Sought/Granted Permission sought to share information with : Family Supports Permission granted to share information with : No              Emotional Assessment Appearance:: Appears stated age Attitude/Demeanor/Rapport: Gracious Affect (typically observed): Pleasant Orientation: : Oriented to Self, Oriented to Place, Oriented to  Time, Oriented to Situation Alcohol / Substance Use: Not Applicable Psych Involvement: No (comment)  Admission diagnosis:  Status post total shoulder arthroplasty, right [Z96.611] 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   Obstructive sleep apnea 02/25/2020   Closed fracture of left proximal humerus 02/11/2019   PCP:  Eartha Inch, MD Pharmacy:   [id number] - [place], [place] - [address] [place] [address] Phone: [phone] Fax: [phone]     Social Determinants of  Health (SDOH) Interventions    Readmission Risk Interventions     No data to display

## 2022-07-19 NOTE — Evaluation (Signed)
Occupational Therapy Evaluation Patient Details Name: Shirley Mcdonald MRN: [id number] DOB: 09/26/1950 Today's Date: 07/19/2022   History of Present Illness Ms. Shirley Mcdonald is s/p a R reverse augmented total shoulder arthroplasty on 07-18-2022, due to end stage arthritis with cuff tear. PMH: DM II, HTN, sleep apnea, thyroid disease   Clinical Impression   The patient is currently presenting well below her baseline level of functioning for self-care management, given new RUE NWB status and associated shoulder ROM limitations, impaired functional mobility, post-op pain, and general deconditioning. She required mod assist for supine to sit, max assist for simulated lower body dressing, and mod assist to perform a lateral scoot transfer from the bed to bedside chair. She lives alone & expressed concerns about being able to manage her self-care care tasks and mobility,  due to her new shoulder surgery and post-op limitations; she further indicated a fear of falling if she were to return home alone. As such, short-term SNF rehab is recommended. OT will continue to follow the pt for further services, in order to maximize her safety and independence with self-care tasks and to decrease the risk for restricted participation in meaningful activities.      Recommendations for follow up therapy are one component of a multi-disciplinary discharge planning process, led by the attending physician.  Recommendations may be updated based on patient status, additional functional criteria and insurance authorization.   Follow Up Recommendations  Skilled nursing-short term rehab (<3 hours/day)     Assistance Recommended at Discharge Frequent or constant Supervision/Assistance  Patient can return home with the following A lot of help with bathing/dressing/bathroom;A lot of help with walking and/or transfers;Assistance with cooking/housework    Functional Status Assessment  Patient has had a recent decline in their  functional status and demonstrates the ability to make significant improvements in function in a reasonable and predictable amount of time.  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      Mobility Bed Mobility Overal bed mobility: Needs Assistance Bed Mobility: Supine to Sit     Supine to sit: Mod assist     General bed mobility comments: Pt required increased time and effort & cues for best transfer technique, including advancing LLE and reaching for bed rail    Transfers Overall transfer level: Needs assistance                Lateral/Scoot Transfers: Mod assist General transfer comment: Pt was instructed to push with her L LE, reach for the chair rail with her LUE, and trunk shifting forward      Balance Overall balance assessment: History of Falls     Sitting balance - Comments: static sitting-good, dynamic sitting-fair                 ADL either performed or assessed with clinical judgement   ADL Overall ADL's : Needs assistance/impaired Eating/Feeding: Independent;Sitting Eating/Feeding Details (indicate cue type and reason): based on clinical judgement Grooming: Set up;Minimal assistance           Upper Body Dressing : Moderate assistance Upper Body Dressing Details (indicate cue type and reason): limited by RUE ROM limitations Lower Body  Dressing: Maximal assistance                      Pertinent Vitals/Pain Pain Assessment Pain Assessment: 0-10 Pain Score: 4  Pain Location: RUE Pain Intervention(s): Ice applied, Repositioned     Hand Dominance Left    Extremity/Trunk Assessment     Lower Extremity Assessment Lower Extremity Assessment: RLE deficits/detail RLE Deficits / Details: 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       Communication Communication Communication: No difficulties   Cognition Arousal/Alertness: Awake/alert Behavior During Therapy: WFL for tasks assessed/performed Overall Cognitive Status: Within Functional Limits for tasks assessed        General Comments: Oriented x4, able to follow commands without difficulty                Home Living Family/patient expects to be discharged to:: Unsure Living Arrangements: Alone;Other (Comment) ([employer])         Prior Functioning/Environment Prior Level of Function : Independent/Modified Independent             Mobility Comments: She performs stand-pivot transfers into and out of her chair power wheelchair, which she uses for mobility inside her home. ADLs Comments: She was modified independent to independent with ADLs. Meals are provided by the facility and the staff also assists with cleaning 1x per week.        OT Problem List: Decreased strength;Decreased range of motion;Decreased activity tolerance;Impaired balance (sitting and/or standing);Decreased knowledge of precautions;Impaired UE functional use;Pain      OT Treatment/Interventions: Self-care/ADL training;Therapeutic exercise;Therapeutic activities;Energy conservation;Patient/family education;DME and/or AE instruction;Balance training    OT Goals(Current goals can be found in the care plan section) Acute Rehab OT Goals Patient Stated Goal: To go to short-term SNF rehab OT Goal Formulation: With patient Time For Goal Achievement: 08/02/22 Potential to Achieve Goals: Good ADL Goals Pt Will Perform Grooming: with set-up;sitting Pt Will Perform Upper Body Dressing: with supervision;sitting Pt Will Perform Lower Body Dressing: with min guard assist Pt Will Transfer to Toilet: with min guard  assist;bedside commode  OT Frequency: Min 2X/week       AM-PAC OT "6 Clicks" Daily Activity     Outcome Measure Help from another person eating meals?: None Help from another person taking care of personal grooming?: A Little Help from another person toileting, which includes using toliet, bedpan, or urinal?: A Lot Help from another person bathing (including washing, rinsing, drying)?: A Lot Help from another person to put on and taking off regular upper body clothing?: A Lot Help from another person to put on and taking off regular lower body clothing?: A Lot 6 Click Score: 15   End of Session Equipment Utilized During Treatment: Gait belt Nurse Communication: Mobility status  Activity Tolerance: Patient tolerated treatment well Patient left: in chair;with call bell/phone within reach  OT Visit Diagnosis: Muscle weakness (generalized) (M62.81);Pain Pain - Right/Left: Right Pain - part of body: Shoulder                Time: 2376-2831 OT Time Calculation (min): 41 min Charges:  OT General Charges $OT Visit: 1 Visit OT Evaluation $OT Eval Moderate Complexity: 1 Mod OT Treatments $Therapeutic Activity: 8-22 mins    Reuben Likes, OTR/L 07/19/2022, 2:32 PM

## 2022-07-20 DIAGNOSIS — M19011 Primary osteoarthritis, right shoulder: Secondary | ICD-10-CM | POA: Diagnosis not present

## 2022-07-20 LAB — GLUCOSE, CAPILLARY
Glucose-Capillary: 174 mg/dL — ABNORMAL HIGH (ref 70–99)
Glucose-Capillary: 233 mg/dL — ABNORMAL HIGH (ref 70–99)
Glucose-Capillary: 188 mg/dL — ABNORMAL HIGH (ref 70–99)
Glucose-Capillary: 180 mg/dL — ABNORMAL HIGH (ref 70–99)

## 2022-07-20 NOTE — Progress Notes (Signed)
Physical Therapy Treatment Patient Details Name: Shirley Mcdonald MRN: [id number] DOB: October 05, 1950 Today's Date: 07/20/2022   History of Present Illness Ms. Parenteau is s/p a R reverse augmented total shoulder arthroplasty on 07-18-2022, due to end stage arthritis with cuff tear. PMH: DM II, HTN, sleep apnea, thyroid disease, R AKA    PT Comments    Nursing attempted sit to stand from recliner with assist of 3, pt was unable to rise. Utilized maximove lift equipment to transfer pt from recliner to bed. +2 total assist for scooting up in bed and for rolling for placement of bedpan.     Recommendations for follow up therapy are one component of a multi-disciplinary discharge planning process, led by the attending physician.  Recommendations may be updated based on patient status, additional functional criteria and insurance authorization.  Follow Up Recommendations  Skilled nursing-short term rehab (<3 hours/day) Can patient physically be transported by private vehicle: No   Assistance Recommended at Discharge Frequent or constant Supervision/Assistance  Patient can return home with the following Two people to help with walking and/or transfers;A lot of help with bathing/dressing/bathroom;Assistance with cooking/housework;Assist for transportation;Help with stairs or ramp for entrance   Equipment Recommendations  None recommended by PT    Recommendations for Other Services       Precautions / Restrictions Precautions Precautions: Shoulder;Fall Type of Shoulder Precautions: Okay to perform RUE hand, wrist and elbow ROM, ok to remove sling for ADLs at waist level, no AROM/PROM R shoulder Shoulder Interventions: Shoulder sling/immobilizer;Off for dressing/bathing/exercises Restrictions Weight Bearing Restrictions: Yes Other Position/Activity Restrictions: OK to remove sling and WB through RUE for mobility; h/o old R AKA     Mobility  Bed Mobility Overal bed mobility: Needs Assistance Bed  Mobility: Rolling Rolling: Max assist, +2 for physical assistance   Supine to sit: Mod assist, +2 for physical assistance     General bed mobility comments: assist to roll for placement of bedpan, then for removal and pericare    Transfers Overall transfer level: Needs assistance Equipment used: Rolling walker (2 wheels) Transfers: Bed to chair/wheelchair/BSC Sit to Stand: Mod assist, From elevated surface, +2 safety/equipment          Lateral/Scoot Transfers: +2 safety/equipment, Min assist General transfer comment: nursing attempted sit to stand from recliner, pt unable to stand with assist of 3. Utilized maximove for chair to bed transfer. +2 total assist to scoot up in bed.    Ambulation/Gait               General Gait Details: non ambulatory at baseline   Stairs             Wheelchair Mobility    Modified Rankin (Stroke Patients Only)       Balance Overall balance assessment: History of Falls   Sitting balance-Leahy Scale: Fair Sitting balance - Comments: static sitting-good, dynamic sitting-fair                                    Cognition Arousal/Alertness: Awake/alert Behavior During Therapy: WFL for tasks assessed/performed Overall Cognitive Status: Within Functional Limits for tasks assessed                                 General Comments: Oriented x4, able to follow commands without difficulty        Exercises  General Comments        Pertinent Vitals/Pain Pain Assessment Pain Score: 8  Pain Location: RUE Pain Descriptors / Indicators: Sore Pain Intervention(s): Limited activity within patient's tolerance, Monitored during session, Premedicated before session, Repositioned, Ice applied    Home Living Family/patient expects to be discharged to:: Skilled nursing facility Living Arrangements: Alone;Other (Comment) ([facility] [employer] [facility])                      Prior  Function            PT Goals (current goals can now be found in the care plan section) Acute Rehab PT Goals Patient Stated Goal: return to independence with WC transfers, agreeable to ST-SNf PT Goal Formulation: With patient Time For Goal Achievement: 08/03/22 Potential to Achieve Goals: Good Progress towards PT goals: Progressing toward goals    Frequency    Min 3X/week      PT Plan Current plan remains appropriate    Co-evaluation              AM-PAC PT "6 Clicks" Mobility   Outcome Measure  Help needed turning from your back to your side while in a flat bed without using bedrails?: A Lot Help needed moving from lying on your back to sitting on the side of a flat bed without using bedrails?: A Lot Help needed moving to and from a bed to a chair (including a wheelchair)?: A Lot Help needed standing up from a chair using your arms (e.g., wheelchair or bedside chair)?: A Lot Help needed to walk in hospital room?: Total Help needed climbing 3-5 steps with a railing? : Total 6 Click Score: 10    End of Session Equipment Utilized During Treatment: Gait belt Activity Tolerance: Patient tolerated treatment well Patient left: with call bell/phone within reach;in bed;with bed alarm set Nurse Communication: Mobility status PT Visit Diagnosis: Difficulty in walking, not elsewhere classified (R26.2)     Time: 1251-1310 PT Time Calculation (min) (ACUTE ONLY): 19 min  Charges:  $Therapeutic Activity: 8-22 mins                     Blondell Reveal Kistler PT 07/20/2022  Acute Rehabilitation Services  Office [phone]

## 2022-07-20 NOTE — Evaluation (Signed)
Physical Therapy Evaluation Patient Details Name: Shirley Mcdonald MRN: [id number] DOB: July 19, 1951 Today's Date: 07/20/2022  History of Present Illness  Shirley Mcdonald is s/p a R reverse augmented total shoulder arthroplasty on 07-18-2022, due to end stage arthritis with cuff tear. PMH: DM II, HTN, sleep apnea, thyroid disease, R AKA  Clinical Impression  Pt admitted with above diagnosis. +2 mod assist for supine to sit, and to stand and pivot to a recliner with a RW. ST-SNF recommended.  Pt currently with functional limitations due to the deficits listed below (see PT Problem List). Pt will benefit from skilled PT to increase their independence and safety with mobility to allow discharge to the venue listed below.          Recommendations for follow up therapy are one component of a multi-disciplinary discharge planning process, led by the attending physician.  Recommendations may be updated based on patient status, additional functional criteria and insurance authorization.  Follow Up Recommendations Skilled nursing-short term rehab (<3 hours/day) Can patient physically be transported by private vehicle: No    Assistance Recommended at Discharge Frequent or constant Supervision/Assistance  Patient can return home with the following  Two people to help with walking and/or transfers;A lot of help with bathing/dressing/bathroom;Assistance with cooking/housework;Assist for transportation;Help with stairs or ramp for entrance    Equipment Recommendations None recommended by PT  Recommendations for Other Services       Functional Status Assessment Patient has had a recent decline in their functional status and demonstrates the ability to make significant improvements in function in a reasonable and predictable amount of time.     Precautions / Restrictions Precautions Precautions: Shoulder;Fall Type of Shoulder Precautions: Okay to perform RUE hand, wrist and elbow ROM, ok to remove sling for ADLs  at waist level, no AROM/PROM R shoulder Shoulder Interventions: Shoulder sling/immobilizer;Off for dressing/bathing/exercises Restrictions Weight Bearing Restrictions: Yes Other Position/Activity Restrictions: OK to remove sling and WB through RUE for mobility; h/o old R AKA      Mobility  Bed Mobility Overal bed mobility: Needs Assistance Bed Mobility: Supine to Sit     Supine to sit: Mod assist, +2 for physical assistance     General bed mobility comments: assist to raise trunk and to pivot hips to EOB with bed pad    Transfers Overall transfer level: Needs assistance Equipment used: Rolling walker (2 wheels) Transfers: Sit to/from Stand Sit to Stand: Mod assist, From elevated surface, +2 safety/equipment          Lateral/Scoot Transfers: +2 safety/equipment, Min assist General transfer comment: assist to power up/steady, pt able to pivot on LLE with increased time, min A to control descent and to guide hips to recliner    Ambulation/Gait               General Gait Details: non ambulatory at baseline  Stairs            Wheelchair Mobility    Modified Rankin (Stroke Patients Only)       Balance Overall balance assessment: History of Falls   Sitting balance-Leahy Scale: Fair Sitting balance - Comments: static sitting-good, dynamic sitting-fair                                     Pertinent Vitals/Pain Pain Assessment Pain Score: 4  Pain Location: RUE Pain Descriptors / Indicators: Sore Pain Intervention(s): Limited activity within patient's tolerance, Monitored during  session, Repositioned, Ice applied    Home Living Family/patient expects to be discharged to:: Skilled nursing facility Living Arrangements: Alone;Other (Comment) ([facility])                      Prior Function Prior Level of Function : Independent/Modified Independent             Mobility Comments: She performs stand-pivot  transfers into and out of her chair power wheelchair, which she uses for mobility inside her home. ADLs Comments: She was modified independent to independent with ADLs. Meals are provided by the facility and the staff also assists with cleaning 1x per week.     Hand Dominance   Dominant Hand: Left    Extremity/Trunk Assessment   Upper Extremity Assessment Upper Extremity Assessment: Defer to OT evaluation;RUE deficits/detail RUE: Unable to fully assess due to immobilization    Lower Extremity Assessment Lower Extremity Assessment: LLE deficits/detail RLE Deficits / Details: Old AKA LLE Deficits / Details: L knee ext +4/5    Cervical / Trunk Assessment Cervical / Trunk Assessment: Normal  Communication   Communication: No difficulties  Cognition Arousal/Alertness: Awake/alert Behavior During Therapy: WFL for tasks assessed/performed Overall Cognitive Status: Within Functional Limits for tasks assessed                                 General Comments: Oriented x4, able to follow commands without difficulty        General Comments      Exercises     Assessment/Plan    PT Assessment Patient needs continued PT services  PT Problem List Decreased activity tolerance;Decreased mobility;Pain       PT Treatment Interventions Therapeutic activities;Functional mobility training;Patient/family education;Therapeutic exercise    PT Goals (Current goals can be found in the Care Plan section)  Acute Rehab PT Goals Patient Stated Goal: return to independence with WC transfers, agreeable to ST-SNf PT Goal Formulation: With patient Time For Goal Achievement: 08/03/22 Potential to Achieve Goals: Good    Frequency Min 3X/week     Co-evaluation               AM-PAC PT "6 Clicks" Mobility  Outcome Measure Help needed turning from your back to your side while in a flat bed without using bedrails?: A Lot Help needed moving from lying on your back to sitting  on the side of a flat bed without using bedrails?: A Lot Help needed moving to and from a bed to a chair (including a wheelchair)?: A Lot Help needed standing up from a chair using your arms (e.g., wheelchair or bedside chair)?: A Lot Help needed to walk in hospital room?: Total Help needed climbing 3-5 steps with a railing? : Total 6 Click Score: 10    End of Session Equipment Utilized During Treatment: Gait belt Activity Tolerance: Patient tolerated treatment well Patient left: in chair;with call bell/phone within reach;with chair alarm set Nurse Communication: Mobility status PT Visit Diagnosis: Difficulty in walking, not elsewhere classified (R26.2)    Time: 1694-5038 PT Time Calculation (min) (ACUTE ONLY): 14 min   Charges:   PT Evaluation $PT Eval Moderate Complexity: 1 Mod         Tamala Ser PT 07/20/2022  Acute Rehabilitation Services  Office [phone]

## 2022-07-20 NOTE — TOC Progression Note (Signed)
Transition of Care [facility]) - Progression Note   Patient Details  Name: Shirley Mcdonald MRN: [id number] Date of Birth: 1951/04/22  Transition of Care [facility]) CM/SW Contact  Ewing Schlein, LCSW Phone Number: 07/20/2022, 3:44 PM  Clinical Narrative: FL2 completed and faxed to [facility] SNF for review. Insurance authorization has been started on the [employer]. CSW confirmed with Neysa Bonito in admissions at [facility] SN[facility] that patient can be admitted there, but cannot be admitted today or over the weekend so a Monday (07/23/22) admission is expected if insurance approves SNF. CSW updated patient and ortho PA regarding the delay with insurance approval. TOC awaiting insurance authorization.  Expected Discharge Plan: Skilled Nursing Facility Barriers to Discharge: Continued Medical Work up (awaiting therapy recommendation for disposition plan)  Expected Discharge Plan and Services Expected Discharge Plan: Skilled Nursing Facility Post Acute Care Choice: Skilled Nursing Facility (pending therapy recommendations) Living arrangements for the past 2 months: Apartment             DME Arranged: N/A DME Agency: NA (pending assessment)  Social Determinants of Health (SDOH) Interventions    Readmission Risk Interventions     No data to display

## 2022-07-20 NOTE — Progress Notes (Signed)
   ORTHOPAEDIC PROGRESS NOTE  s/p Procedure(s): REVERSE SHOULDER ARTHROPLASTY on 07/18/2022 with Dr. Everardo Pacific   SUBJECTIVE: Reports moderate pain about operative site. Feels some better than yesterday. She has been very sleepy since surgery. States she slept most of the day.  No chest pain. No SOB. No nausea/vomiting. No other complaints. Plan to discharge to SNF.   OBJECTIVE: PE: General: resting in hospital bed, NAD RUE: Dressing CDI and sling well fitting. full and painless ROM throughout hand with DPC of 0. + Motor in  AIN, PIN,. Difficulty with ulnar nerve motor function which she states is chronic. Axillary nerve sensation preserved.  Sensation intact in medial, radial, and ulnar distributions. Well perfused digits.    Vitals:   07/19/22 2125 07/20/22 0527  BP:  (!) 129/100  Pulse:  (!) 107  Resp:  20  Temp: 100.3 F (37.9 C) 98.5 F (36.9 C)  SpO2: 93% 92%   Stable post-op images.   ASSESSMENT: Shirley Mcdonald is a 71 y.o. female doing well postoperatively. POD#2  PLAN: Weightbearing:  - NWB in sling until nerve block wears off  - then okay to use arm for ADLs as well as transfers  - physical therapy to start for mobility purposes now but no shoulder range of motion encouraged until 4 weeks after surgery Insicional and dressing care: Reinforce dressings as needed Orthopedic device(s):  Sling Showering: post-op day #2 with assistance VTE prophylaxis: Lovenox Pain control: PRN pain medications, minimize narcotics as able Follow - up plan: 1 week in office Dispo: SNF. OT recommending SNF. PT eval hopefully today. Patient was referred to [facility] in [place] prior to surgery. TOC following. Appreciate their assistance.  Contact information:  Weekdays 8-5 Dr. Ramond Marrow, Alfonse Alpers PA-C , After hours and holidays please check [email or web address] for group call information for [facility]   Alfonse Alpers, PA-C 07/20/2022

## 2022-07-20 NOTE — NC FL2 (Signed)
[place] MEDICAID FL2 LEVEL OF CARE FORM     IDENTIFICATION  Patient Name: Shirley Mcdonald Birthdate: [birth year] Sex: female Admission Date (Current Location): 07/18/2022  [facility] and [place] Number:  Producer, television/film/video and Address:  [facility],  [address] [place], [place] [address]      Provider Number: [id number]  Attending Physician Name and Address:  Bjorn Pippin, MD  Relative Name and Phone Number:  Precious Reel (daughter) Ph: [phone]    Current Level of Care: Hospital Recommended Level of Care: Skilled Nursing Facility Prior Approval Number:    Date Approved/Denied:   PASRR Number: [id number]  Discharge Plan: SNF    Current Diagnoses: 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   Obstructive sleep apnea 02/25/2020   Closed fracture of left proximal humerus 02/11/2019    Orientation RESPIRATION BLADDER Height & Weight     Self, Time, Situation, Place  Normal Incontinent Weight: 195 lb (88.5 kg) Height:  5\' 2"  (157.5 cm)  BEHAVIORAL SYMPTOMS/MOOD NEUROLOGICAL BOWEL NUTRITION STATUS     (N/A) Continent Diet (Carb modified)  AMBULATORY STATUS COMMUNICATION OF NEEDS Skin   Extensive Assist (Patient is nonambulatory at baseline.) Verbally Surgical wounds                       Personal Care Assistance Level of Assistance  Bathing, Feeding, Dressing Bathing Assistance: Maximum assistance Feeding assistance: Independent Dressing Assistance: Maximum assistance     Functional Limitations Info  Sight, Hearing, Speech Sight Info: Impaired Hearing Info: Adequate Speech Info: Adequate    SPECIAL CARE FACTORS FREQUENCY  PT (By licensed PT), OT (By licensed OT)     PT Frequency: 5x's/week OT Frequency: 5x's/week            Contractures Contractures Info: Not present    Additional Factors Info  Code Status, Allergies, Insulin Sliding Scale, Psychotropic Code  Status Info: Full Allergies Info: Hydromorphone Hcl, Hydrocodone-acetaminophen, Hydromorphone, Lotrel (Amlodipine Besy-benazepril Hcl), Oxycontin (Oxycodone Hcl), Saccharin, Tape, Adhesive (Tape), Amlodipine Besy-benazepril Hcl Psychotropic Info: Wellbutrin, Desyrel, Lamictal Insulin Sliding Scale Info: See discharge summary       Current Medications (07/20/2022):  This is the current hospital active medication list Current Facility-Administered Medications  Medication Dose Route Frequency Provider Last Rate Last Admin   albuterol (PROVENTIL) (2.5 MG/3ML) 0.083% nebulizer solution 2.5 mg  2.5 mg Nebulization Q6H PRN 14/08/2021, Dax T, MD       amLODipine (NORVASC) tablet 2.5 mg  2.5 mg Oral Daily 12-09-1995, PA-C   2.5 mg at 07/20/22 0844   atorvastatin (LIPITOR) tablet 20 mg  20 mg Oral QPM McBane, 14/01/23, PA-C   20 mg at 07/19/22 1732   benzonatate (TESSALON) capsule 100 mg  100 mg Oral TID 07/21/22, PA-C   100 mg at 07/19/22 2144   bisacodyl (DULCOLAX) suppository 10 mg  10 mg Rectal Daily PRN 2145, PA-C       buPROPion (WELLBUTRIN XL) 24 hr tablet 300 mg  300 mg Oral Daily Vernetta Honey, PA-C   300 mg at 07/20/22 0845   celecoxib (CELEBREX) capsule 200 mg  200 mg Oral BID 14/01/23, PA-C   200 mg at 07/20/22 0844   colchicine tablet 0.6 mg  0.6 mg Oral BID 14/01/23, PA-C   0.6 mg at 07/20/22 0848   diphenhydrAMINE (BENADRYL) 12.5 MG/5ML elixir 12.5-25 mg  12.5-25 mg Oral Q4H PRN McBane, Jerald Kief, PA-C       docusate sodium (COLACE) capsule 100 mg  100 mg Oral BID Vernetta Honey, PA-C   100 mg at 07/20/22 0845   enoxaparin (LOVENOX) injection 40 mg  40 mg Subcutaneous Q24H Vernetta Honey, PA-C   40 mg at 07/20/22 0846   famotidine (PEPCID) tablet 20 mg  20 mg Oral BID Vernetta Honey, PA-C   20 mg at 07/20/22 0844   HYDROmorphone (DILAUDID) injection 0.5 mg  0.5 mg Intravenous Q4H PRN McBane, Jerald Kief, PA-C       insulin  aspart (novoLOG) injection 0-15 Units  0-15 Units Subcutaneous TID WC Vernetta Honey, PA-C   3 Units at 07/20/22 0847   insulin detemir (LEVEMIR) injection 17 Units  17 Units Subcutaneous QHS Vernetta Honey, [place]   17 Units at 07/19/22 2145   ipratropium (ATROVENT) nebulizer solution 0.5 mg  0.5 mg Nebulization Q6H PRN Bjorn Pippin, MD       irbesartan (AVAPRO) tablet 150 mg  150 mg Oral Daily Vernetta Honey, PA-C   150 mg at 07/20/22 0845   lamoTRIgine (LAMICTAL) tablet 150 mg  150 mg Oral QHS Vernetta Honey, PA-C   150 mg at 07/19/22 2144   levothyroxine (SYNTHROID) tablet 50 mcg  50 mcg Oral QAC breakfast Vernetta Honey, PA-C   50 mcg at 07/20/22 0923   magnesium citrate solution 1 Bottle  1 Bottle Oral Once PRN McBane, Jerald Kief, PA-C       melatonin tablet 5 mg  5 mg Oral QHS PRN McBane, Jerald Kief, PA-C       menthol-cetylpyridinium (CEPACOL) lozenge 3 mg  1 lozenge Oral PRN McBane, Jerald Kief, PA-C       Or   phenol (CHLORASEPTIC) mouth spray 1 spray  1 spray Mouth/Throat PRN McBane, Jerald Kief, PA-C       methocarbamol (ROBAXIN) tablet 500 mg  500 mg Oral Q6H PRN Vernetta Honey, PA-C   500 mg at 07/20/22 0134   Or   methocarbamol (ROBAXIN) 500 mg in dextrose 5 % 50 mL IVPB  500 mg Intravenous Q6H PRN McBane, Jerald Kief, PA-C       metoCLOPramide (REGLAN) tablet 5-10 mg  5-10 mg Oral Q8H PRN McBane, Jerald Kief, PA-C       Or   metoCLOPramide (REGLAN) injection 5-10 mg  5-10 mg Intravenous Q8H PRN McBane, Jerald Kief, PA-C       metoprolol succinate (TOPROL-XL) 24 hr tablet 25 mg  25 mg Oral Daily Vernetta Honey, PA-C   25 mg at 07/20/22 0846   nortriptyline (PAMELOR) capsule 75 mg  75 mg Oral QHS Ramond Marrow T, MD   75 mg at 07/19/22 2145   ondansetron (ZOFRAN) tablet 4 mg  4 mg Oral Q6H PRN Vernetta Honey, PA-C       Or   ondansetron (ZOFRAN) injection 4 mg  4 mg Intravenous Q6H PRN McBane, Jerald Kief, PA-C       oxyCODONE (Oxy IR/ROXICODONE) immediate  release tablet 10-15 mg  10-15 mg Oral Q4H PRN Vernetta Honey, PA-C   15 mg at 07/20/22 0134   oxyCODONE (Oxy IR/ROXICODONE) immediate release tablet 5-10 mg  5-10 mg Oral Q4H PRN Vernetta Honey, PA-C   10 mg at 07/20/22 0843   pantoprazole (PROTONIX) EC tablet 40 mg  40 mg Oral QHS McBane, Caroline N, PA-C   40 mg  at 07/19/22 2144   polyethylene glycol (MIRALAX / GLYCOLAX) packet 17 g  17 g Oral Daily PRN McBane, Jerald Kief, PA-C       traZODone (DESYREL) tablet 100 mg  100 mg Oral QHS Vernetta Honey, PA-C   100 mg at 07/19/22 2144     Discharge Medications: Please see discharge summary for a list of discharge medications.  Relevant Imaging Results:  Relevant Lab Results:   Additional Information SSN: [id number]  Ewing Schlein, LCSW

## 2022-07-21 DIAGNOSIS — M19011 Primary osteoarthritis, right shoulder: Secondary | ICD-10-CM | POA: Diagnosis not present

## 2022-07-21 LAB — GLUCOSE, CAPILLARY
Glucose-Capillary: 159 mg/dL — ABNORMAL HIGH (ref 70–99)
Glucose-Capillary: 142 mg/dL — ABNORMAL HIGH (ref 70–99)
Glucose-Capillary: 156 mg/dL — ABNORMAL HIGH (ref 70–99)
Glucose-Capillary: 140 mg/dL — ABNORMAL HIGH (ref 70–99)

## 2022-07-21 NOTE — Progress Notes (Signed)
Subjective: 3 Days Post-Op Procedure(s) (LRB): REVERSE SHOULDER ARTHROPLASTY (Right) Patient reports pain as moderate.    Objective: Vital signs in last 24 hours: Temp:  [97.5 F (36.4 C)-99.6 F (37.6 C)] 97.5 F (36.4 C) (12/02 1131) Pulse Rate:  [101-107] 103 (12/02 1131) Resp:  [14-16] 15 (12/02 1131) BP: (104-118)/(76-83) 104/83 (12/02 1131) SpO2:  [90 %-94 %] 94 % (12/02 1131)  Intake/Output from previous day: 12/01 0701 - 12/02 0700 In: 900 [P.O.:900] Out: 600 [Urine:600] Intake/Output this shift: No intake/output data recorded.  Recent Labs    07/18/22 1735  HGB 12.9   Recent Labs    07/18/22 1735  WBC 10.7*  RBC 4.52  HCT 37.2  PLT 157   Recent Labs    07/18/22 1735  CREATININE 0.47   No results for input(s): "LABPT", "INR" in the last 72 hours.  Neurovascular intact Sensation intact distally Intact pulses distally Incision: dressing C/D/I   Assessment/Plan: 3 Days Post-Op Procedure(s) (LRB): REVERSE SHOULDER ARTHROPLASTY (Right)  Weightbearing:  - NWB in sling until nerve block wears off  - then okay to use arm for ADLs as well as transfers  - physical therapy to start for mobility purposes now but no shoulder range of motion encouraged until 4 weeks after surgery Insicional and dressing care: Reinforce dressings as needed Orthopedic device(s):  Sling Showering: post-op day #2 with assistance VTE prophylaxis: Lovenox Pain control: PRN pain medications, minimize narcotics as able Follow - up plan: 1 week in office Dispo: SNF. OT/PT recommending SNF. Patient was referred to [facility] in [place] prior to surgery. TOC following. Appreciate their assistance. Likely d/c Mon.      Margart Sickles 07/21/2022, 12:40 PM

## 2022-07-22 DIAGNOSIS — M19011 Primary osteoarthritis, right shoulder: Secondary | ICD-10-CM | POA: Diagnosis not present

## 2022-07-22 LAB — GLUCOSE, CAPILLARY
Glucose-Capillary: 143 mg/dL — ABNORMAL HIGH (ref 70–99)
Glucose-Capillary: 175 mg/dL — ABNORMAL HIGH (ref 70–99)
Glucose-Capillary: 162 mg/dL — ABNORMAL HIGH (ref 70–99)
Glucose-Capillary: 166 mg/dL — ABNORMAL HIGH (ref 70–99)

## 2022-07-22 NOTE — Progress Notes (Signed)
Patient said that she has not been peed all day all. Bladder scan showed >953ml urine retention  in her bladder. Notified to oncall PA [facility], Received new order for IN&out cath. In&Out cath done per order, drained 1600 ml amber color urine. Patient tolerated procedure well.

## 2022-07-22 NOTE — Progress Notes (Addendum)
Patient unable to void this morning, bladder scan showed urine retention. Inserted foley per PA Chadwell, Joshua order.   ___________________________________  In further discussion with current nurse:  Bladder scan suggested >900 mL prior to insertion. At insertion 1600 cc of urine was drained.  Myrene Galas, MD [facility], [facility] [phone]

## 2022-07-22 NOTE — Progress Notes (Addendum)
[facility] (OTS)  4 Days Post-Op Procedure(s) (LRB): REVERSE SHOULDER ARTHROPLASTY (Right)  Subjective: Patient reports pain as moderate.  She is performing some passive motion using her contralateral arm.  Objective: Current Vitals Blood pressure 109/70, pulse 89, temperature 98.3 F (36.8 C), temperature source Oral, resp. rate 16, height 5\' 2"  (1.575 m), weight 88.5 kg, SpO2 94 %. Vital signs in last 24 hours: Temp:  [97.5 F (36.4 C)-98.3 F (36.8 C)] 98.3 F (36.8 C) (12/03 0449) Pulse Rate:  [89-103] 89 (12/03 0449) Resp:  [15-16] 16 (12/03 0449) BP: (104-119)/(70-83) 109/70 (12/03 0449) SpO2:  [94 %-95 %] 94 % (12/03 0449)  Intake/Output from previous day: 12/02 0701 - 12/03 0700 In: 60 [P.O.:60] Out: 1711 [Urine:1711]  LABS No results for input(s): "HGB" in the last 72 hours. No results for input(s): "WBC", "RBC", "HCT", "PLT" in the last 72 hours. No results for input(s): "NA", "K", "CL", "CO2", "BUN", "CREATININE", "GLUCOSE", "CALCIUM" in the last 72 hours. No results for input(s): "LABPT", "INR" in the last 72 hours.   Physical Exam RUE  Sling in place  Sens  Ax/R/M/U intact  Mot   Ax/ R/ PIN/ M/ AIN/ U intact  Brisk CR, RAd 2+  Drsg is dry and pristine.  Assessment/Plan: 4 Days Post-Op Procedure(s) (LRB): REVERSE SHOULDER ARTHROPLASTY (Right) DM with well controlled sugars Urinary retention with 1600 cc out at time of retention S/P RIGHT AKA  1. PT/OT  2. DVT proph Lovenox 3. D/c to SNF tomorrow; F/u 8-14 days with Dr. 14/03 4. Anticipate three days from foley insertion to trial foley removal given the large volume.  Shirley Pacific, MD [facility], [facility] [phone]

## 2022-07-23 DIAGNOSIS — M19011 Primary osteoarthritis, right shoulder: Secondary | ICD-10-CM | POA: Diagnosis not present

## 2022-07-23 LAB — GLUCOSE, CAPILLARY
Glucose-Capillary: 118 mg/dL — ABNORMAL HIGH (ref 70–99)
Glucose-Capillary: 137 mg/dL — ABNORMAL HIGH (ref 70–99)

## 2022-07-23 MED ORDER — BISACODYL 5 MG PO TBEC
5.0000 mg | DELAYED_RELEASE_TABLET | Freq: Once | ORAL | Status: AC
  Administered 2022-07-23: 5 mg via ORAL
  Filled 2022-07-23: qty 1

## 2022-07-23 NOTE — Progress Notes (Signed)
Patient was picked up by PTAR, and AVS was placed in patient's packet for [facility]. Patient was stable for discharge.

## 2022-07-23 NOTE — Discharge Instructions (Addendum)
Ramond Marrow MD, MPH Alfonse Alpers, PA-C [facility] [address] [phone] (tel)   [phone] (fax)   POST-OPERATIVE INSTRUCTIONS - TOTAL SHOULDER REPLACEMENT    WOUND CARE You may leave the operative dressing in place until your follow-up appointment. KEEP THE INCISIONS CLEAN AND DRY. There may be a small amount of fluid/bleeding leaking at the surgical site. This is normal after surgery.  If it fills with liquid or blood please call us immediately to change it for you. Use the provided ice machine or Ice packs as often as possible for the first 3-4 days, then as needed for pain relief.   Keep a layer of cloth or a shirt between your skin and the cooling unit to prevent frost bite as it can get very cold.  SHOWERING: - You may shower on Post-Op Day #2.  - The dressing is water resistant but do not scrub it as it may start to peel up.   - You may remove the sling for showering - Gently pat the area dry.  - Do not soak the shoulder in water.  - Do not go swimming in the pool or ocean until your incision has completely healed (about 4-6 weeks after surgery) - KEEP THE INCISIONS CLEAN AND DRY.  EXERCISES Sling for comfort only You are okay to use your arm for ADLs at waist level Okay to use your right arm for transfers Physical therapy to start for mobility purposes now but no shoulder range of motion encouraged till 4 weeks.  Accidental/Purposeful External Rotation and shoulder flexion (reaching behind you) is to be avoided at all costs for the first month. It is ok to come out of your sling if your are sitting and have assistance for eating.   It is okay to come out of your sling for ADLs and to use your arm for transfers  It is normal for your fingers/hand to become more swollen after surgery and discolored from bruising.   This will resolve over the first few weeks usually after surgery. Please continue to ambulate and do not stay  sitting or lying for too long.  Perform foot and wrist pumps to assist in circulation.    REGIONAL ANESTHESIA (NERVE BLOCKS) The anesthesia team may have performed a nerve block for you this is a great tool used to minimize pain.   The block may start wearing off overnight (between 8-24 hours postop) When the block wears off, your pain may go from nearly zero to the pain you would have had postop without the block. This is an abrupt transition but nothing dangerous is happening.   This can be a challenging period but utilize your as needed pain medications to try and manage this period. We suggest you use the pain medication the first night prior to going to bed, to ease this transition.  You may take an extra dose of narcotic when this happens if needed   POST-OP MEDICATIONS- Multimodal approach to pain control In general your pain will be controlled with a combination of substances.  Prescriptions unless otherwise discussed are electronically sent to your pharmacy.  This is a carefully made plan we use to minimize narcotic use.      Acetaminophen - Non-narcotic pain medicine taken on a scheduled basis  Oxycodone - This is a strong narcotic, to be used only on an "as needed" basis for SEVERE pain. Lovenox - This medicine is used to minimize the risk of blood clots after  surgery. Zofran -  take as needed for nausea Benadryl - you may use benadryl as needed for itching due to narcotic medicine   FOLLOW-UP If you develop a Fever (>101.5), Redness or Drainage from the surgical incision site, please call our office to arrange for an evaluation. Please call the office to schedule a follow-up appointment for a wound check, 7-10 days post-operatively.  IF YOU HAVE ANY QUESTIONS, PLEASE FEEL FREE TO CALL OUR OFFICE.  HELPFUL INFORMATION   You may be more comfortable sleeping in a semi-seated position the first few nights following surgery.  Keep a pillow propped under the elbow and forearm  for comfort.  If you have a recliner type of chair it might be beneficial.  If not that is fine too, but it would be helpful to sleep propped up with pillows behind your operated shoulder as well under your elbow and forearm.  This will reduce pulling on the suture lines.  When dressing, put your operative arm in the sleeve first.  When getting undressed, take your operative arm out last.  Loose fitting, button-down shirts are recommended.  In most states it is against the law to drive while your arm is in a sling. And certainly against the law to drive while taking narcotics.  You may return to work/school in the next couple of days when you feel up to it. Desk work and typing in the sling is fine.  We suggest you use the pain medication the first night prior to going to bed, in order to ease any pain when the anesthesia wears off. You should avoid taking pain medications on an empty stomach as it will make you nauseous.  You should wean off your narcotic medicines as soon as you are able.     Most patients will be off or using minimal narcotics before their first postop appointment.   Do not drink alcoholic beverages or take illicit drugs when taking pain medications.  Pain medication may make you constipated.  Below are a few solutions to try in this order: Decrease the amount of pain medication if you aren't having pain. Drink lots of decaffeinated fluids. Drink prune juice and/or each dried prunes  If the first 3 don't work start with additional solutions Take Colace - an over-the-counter stool softener Take Senokot - an over-the-counter laxative Take Miralax - a stronger over-the-counter laxative   Dental Antibiotics:  In most cases prophylactic antibiotics for Dental procdeures after total joint surgery are not necessary.  Exceptions are as follows:  1. History of prior total joint infection  2. Severely immunocompromised (Organ Transplant, cancer chemotherapy, Rheumatoid  biologic meds such as Humera)  3. Poorly controlled diabetes (A1C &gt; 8.0, blood glucose over 200)  If you have one of these conditions, contact your surgeon for an antibiotic prescription, prior to your dental procedure.   For more information including helpful videos and documents visit our website:   [email or web address]   Foley was placed on 07/22/2022 due to urinary retention. She will require a voiding trial with removal of foley in a few days. This will be done at her skilled nursing facility.

## 2022-07-23 NOTE — Progress Notes (Addendum)
Physical Therapy Treatment Patient Details Name: Shirley Mcdonald MRN: [id number] DOB: 1951-04-23 Today's Date: 07/23/2022   History of Present Illness Shirley Mcdonald is s/p a R reverse augmented total shoulder arthroplasty on 07-18-2022, due to end stage arthritis with cuff tear. PMH: DM II, HTN, sleep apnea, thyroid disease, R AKA    PT Comments    POD x 5 am session. Patient c/o of 8/10 pain in R shoulder, increased to 9/10 briefly after transferring pt to [facility]. Patient is pleasant as well as fearful of falling. Pt stated "I didn't quality for a leg since my stump was too short." Required mod assist +1 to sit on EOB with use of bed pad to scoot pt to EOB. Mod assist +2 needed to guide trunk and bring LLE on stretcher. Required mod assist +2 to stand from elevated bed to steady and for safety. Max assist +2 physical assist needed for stand pivot transfer to [facility] with walker and BSC ankled to do a 1/4 standing pivot. Assisted with pericare. Cues needed to bear weight on RUE. Patient was able to bear min weight through RUE to assist in transfers. EMT arrived during session to transport pt to SNF. Pt will need ST Rehab at SNF to address mobility and functional decline prior to safely returning home.   Recommendations for follow up therapy are one component of a multi-disciplinary discharge planning process, led by the attending physician.  Recommendations may be updated based on patient status, additional functional criteria and insurance authorization.  Follow Up Recommendations  Skilled nursing-short term rehab (<3 hours/day) Can patient physically be transported by private vehicle: No   Assistance Recommended at Discharge Frequent or constant Supervision/Assistance  Patient can return home with the following Two people to help with walking and/or transfers;A lot of help with bathing/dressing/bathroom;Assistance with cooking/housework;Assist for transportation;Help with stairs or ramp for entrance    Equipment Recommendations  None recommended by PT    Recommendations for Other Services       Precautions / Restrictions Precautions Precautions: Shoulder;Fall Type of Shoulder Precautions: Okay to perform RUE hand, wrist and elbow ROM, ok to remove sling for ADLs at waist level, no AROM/PROM R shoulder Shoulder Interventions: Shoulder sling/immobilizer;Off for dressing/bathing/exercises Restrictions Weight Bearing Restrictions: Yes RUE Weight Bearing: Non weight bearing Other Position/Activity Restrictions: OK to remove sling and WB through RUE for mobility; h/o old R AKA     Mobility  Bed Mobility Overal bed mobility: Needs Assistance       Supine to sit: HOB elevated, Mod assist Sit to supine: Mod assist, +2 for physical assistance   General bed mobility comments: Mod assist +1 needed with bed pad to scoot pt to EOB, Mod assist +2 needed to guide trunk and bring LLE on stretcher.    Transfers Overall transfer level: Needs assistance Equipment used: Rolling walker (2 wheels) (BSC) Transfers: Sit to/from Stand, Bed to chair/wheelchair/BSC Sit to Stand: From elevated surface, +2 safety/equipment, Mod assist Stand pivot transfers: Max assist, +2 physical assistance         General transfer comment: Required mod assist +2 to stand from elevated bed. Max assist +2 physical assist needed for stand pivot transfer to [facility] with walker and BSC ankled to do a 1/4 standing pivot. Patient fearful of falling. Cues needed to bear weight on RUE.Patient was able to bear min weight through RUE to assist in transfers.    Ambulation/Gait               General Gait  Details: non ambulatory at baseline   Stairs             Wheelchair Mobility    Modified Rankin (Stroke Patients Only)       Balance                                            Cognition Arousal/Alertness: Awake/alert Behavior During Therapy: WFL for tasks assessed/performed Overall  Cognitive Status: Within Functional Limits for tasks assessed                                 General Comments: Oriented x4, able to follow commands without difficulty        Exercises      General Comments        Pertinent Vitals/Pain Pain Assessment Pain Assessment: 0-10 Pain Score: 8  Pain Location: RUE Pain Descriptors / Indicators: Sore Pain Intervention(s): Limited activity within patient's tolerance, Monitored during session, Repositioned    Home Living Family/patient expects to be discharged to:: Skilled nursing facility Living Arrangements: Alone;Other (Comment)                      Prior Function            PT Goals (current goals can now be found in the care plan section) Acute Rehab PT Goals Patient Stated Goal: return to independence with WC transfers, agreeable to ST-SNf PT Goal Formulation: With patient Time For Goal Achievement: 08/03/22 Potential to Achieve Goals: Good Progress towards PT goals: Progressing toward goals    Frequency    Min 3X/week      PT Plan      Co-evaluation              AM-PAC PT "6 Clicks" Mobility   Outcome Measure  Help needed turning from your back to your side while in a flat bed without using bedrails?: A Lot Help needed moving from lying on your back to sitting on the side of a flat bed without using bedrails?: A Lot Help needed moving to and from a bed to a chair (including a wheelchair)?: A Lot Help needed standing up from a chair using your arms (e.g., wheelchair or bedside chair)?: A Lot Help needed to walk in hospital room?: Total Help needed climbing 3-5 steps with a railing? : Total 6 Click Score: 10    End of Session Equipment Utilized During Treatment: Gait belt Activity Tolerance: Patient tolerated treatment well Patient left: Other (comment) (Patient discharged on stretcher) Nurse Communication: Mobility status PT Visit Diagnosis: Difficulty in walking, not elsewhere  classified (R26.2)     Time: 1610-9604 PT Time Calculation (min) (ACUTE ONLY): 23 min  Charges:  $Therapeutic Activity: 23-37 mins                     }   Shirley Mcdonald 07/23/2022, 12:40 PM

## 2022-07-23 NOTE — Discharge Summary (Cosign Needed Addendum)
Patient ID: Shirley Mcdonald MRN: [id number] DOB/AGE: Apr 01, 1951 71 y.o.  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 She tolerated procedure well.  She was kept for monitoring overnight for pain control, medical monitoring postop, PT/OT evals, and discharge planning. She was kept in the hospital while waiting for insurance approval for SNF.  Patient had urinary retention over the weekend. A foley was placed on 12/3. She will need a voiding trial with foley removal approximately 3 days from insertion due to the large volumes of urine. This was discussed with case management and SNF. The medical provider at the SNF to perform voiding trial a few days after she gets to her facility. She was found to be stable for DC to SNF on 07/23/2022.  Patient was instructed on specific activity restrictions and all questions were answered.   Consults: PT/OT  Significant Diagnostic Studies: No additional pertinent studies  Treatments: Surgery  Discharge Exam: General: resting in hospital bed, NAD RUE: Dressing  CDI and sling well fitting. full and painless ROM throughout hand with DPC of 0. + Motor in  AIN, PIN,. Difficulty with ulnar nerve motor function which she states is chronic. Axillary nerve sensation preserved.  Sensation intact in medial, radial, and ulnar distributions. Well perfused digits.   Disposition: Discharge disposition: 03-[facility]     SNF  Discharge Instructions     Call MD for:  redness, tenderness, or signs of infection (pain, swelling, redness, odor or green/yellow discharge around incision site)   Complete by: As directed    Call MD for:  severe uncontrolled pain   Complete by: As directed    Call MD for:  temperature >100.4   Complete by: As directed    Diet - low sodium heart healthy   Complete by: As directed       Allergies as of 07/23/2022       Reactions   Hydromorphone Hcl Rash   Pt states she did not have a rash on this medication   Hydrocodone-acetaminophen Itching   Makes face itch    Hydromorphone    Sleepy   Lotrel [amlodipine Besy-benazepril Hcl] Cough   Oxycontin [oxycodone Hcl] Itching   Saccharin    Causes stomach pain    Tape    Blister   Adhesive [tape] Rash   Causes blistering   Amlodipine Besy-benazepril Hcl Cough   Saccharin Other (See Comments)        Medication List     STOP  taking these medications    ALKA-SELTZER PO   aspirin EC 81 MG tablet   ondansetron 4 MG disintegrating tablet Commonly known as: Zofran ODT       TAKE these medications    acetaminophen 500 MG tablet Commonly known as: TYLENOL Take 2 tablets (1,000 mg total) by mouth every 8 (eight) hours for 14 days.   albuterol 108 (90 Base) MCG/ACT inhaler Commonly known as: VENTOLIN HFA Inhale 2 puffs into the lungs every 6 (six) hours as needed for wheezing or shortness of breath.   ALKA-SELTZER ANTACID PO Take 1 tablet by mouth daily as needed (gummie.  chew one tablet if needed for GERD).   amLODipine 2.5 MG tablet Commonly  known as: NORVASC Take 2.5 mg by mouth daily.   ascorbic acid 500 MG tablet Commonly known as: VITAMIN C Take 1,000 mg by mouth every evening.   atorvastatin 20 MG tablet Commonly known as: LIPITOR Take 20 mg by mouth every evening.   benzonatate 100 MG capsule Commonly known as: TESSALON Take 1 capsule (100 mg total) by mouth every 8 (eight) hours.   buPROPion 300 MG 24 hr tablet Commonly known as: WELLBUTRIN XL Take 300 mg by mouth daily.   butalbital-acetaminophen-caffeine 50-325-40 MG tablet Commonly known as: FIORICET Take 1 tablet by mouth every 6 (six) hours as needed for migraine.   CALCIUM-VITAMIN D PO Take 1 tablet by mouth in the morning and at bedtime.   celecoxib 100 MG capsule Commonly known as: CELEBREX Take 100 mg by mouth 2 (two) times daily.   colchicine 0.6 MG tablet Take 0.6 mg by mouth 2 (two) times daily.   diphenhydrAMINE 25 mg capsule Commonly known as: Benadryl Allergy Take 1 capsule (25 mg total) by mouth every 6 (six) hours as needed for itching.   docusate sodium 250 MG capsule Commonly known as: COLACE Take 250 mg by mouth at bedtime.   enoxaparin 40 MG/0.4ML injection Commonly known as: LOVENOX Inject 0.4 mLs (40 mg total) into the skin daily. For 4 weeks for DVT prophylaxis after surgery.   famotidine 20 MG tablet Commonly known as: PEPCID Take 20 mg by mouth 2 (two) times daily.   insulin lispro 100 UNIT/ML KwikPen Commonly known as: HUMALOG Inject 5-7 Units into the skin 3 (three) times daily with meals. Sliding scale   ipratropium 0.02 % nebulizer solution Commonly known as: ATROVENT Take 2.5 mLs (0.5 mg total) by nebulization 4 (four) times daily.   lactobacillus acidophilus Tabs tablet Take 1 tablet by mouth daily.   lamoTRIgine 150 MG tablet Commonly known as: LAMICTAL Take 150 mg by mouth at bedtime.   Levemir 100 UNIT/ML injection Generic drug: insulin detemir Inject 17 Units into the skin at bedtime.    levothyroxine 50 MCG tablet Commonly known as: SYNTHROID Take 50 mcg by mouth daily before breakfast.   melatonin 5 MG Tabs Take 5 mg by mouth at bedtime.   metFORMIN 500 MG 24 hr tablet Commonly known as: GLUCOPHAGE-XR Take 500 mg by mouth 2 (two) times daily with a meal.   metoprolol succinate 25 MG 24 hr tablet Commonly known as: TOPROL-XL Take 25 mg by mouth daily.   multivitamin with minerals Tabs tablet Take 1 tablet by mouth every evening.   nortriptyline 75 MG capsule Commonly known as: PAMELOR Take 75 mg by mouth at bedtime.   olmesartan 20 MG tablet Commonly known as: BENICAR Take 20 mg by mouth daily.   ondansetron 4 MG tablet Commonly known as:  Zofran Take 1 tablet (4 mg total) by mouth every 8 (eight) hours as needed for up to 7 days for nausea or vomiting.   oxyCODONE 5 MG immediate release tablet Commonly known as: Oxy IR/ROXICODONE Take 1 pills every 6 hrs as needed for severe pain after surgery   pantoprazole 40 MG tablet Commonly known as: PROTONIX Take 40 mg by mouth at bedtime.   traZODone 50 MG tablet Commonly known as: DESYREL Take 100 mg by mouth at bedtime.   UNABLE TO FIND Take 25 mg by mouth in the morning and at bedtime. CBD GUMMY   VITAMIN B COMPLEX PO Take 1 tablet by mouth at bedtime.   Vitamin D3 125 MCG (5000 UT) Tabs Take 5,000 Units by mouth every evening.        Alfonse Alpers, PA-C 07/23/2022

## 2022-07-23 NOTE — TOC Transition Note (Signed)
Transition of Care [facility]) - CM/SW Discharge Note  Patient Details  Name: Shirley Mcdonald MRN: [id number] Date of Birth: 1950-11-30  [facility]) CM/SW Contact:  Ewing Schlein, LCSW Phone Number: 07/23/2022, 11:35 AM  Clinical Narrative: Patient has been approved for SNF for 12/2-12/5 and the reference ID# is: [id number]. CSW confirmed admission with Neysa Bonito in admissions at [facility]. The number for report is [phone]. Discharge summary, discharge orders, and SNF transfer report faxed to facility in hub.  Medical necessity form done; PTAR scheduled. Discharge packet completed. Patient notified of discharge and insurance approval. RN updated. TOC signing off.  Final next level of care: Skilled Nursing Facility Barriers to Discharge: Barriers Resolved  Patient Goals and CMS Choice Patient states their goals for this hospitalization and ongoing recovery are:: Wants to get rehab to be able to return home safely CMS Medicare.gov Compare Post Acute Care list provided to:: Patient Choice offered to / list presented to : Patient  Discharge Placement Existing PASRR number confirmed : 07/20/22          Patient chooses bed at: Other - please specify in the comment section below: [facility]) Patient to be transferred to facility by: PTAR Patient and family notified of of transfer: 07/23/22  Discharge Plan and Services Post Acute Care Choice: Skilled Nursing Facility (pending therapy recommendations)          DME Arranged: N/A DME Agency: NA (pending assessment)  Social Determinants of Health (SDOH) Interventions   Readmission Risk Interventions     No data to display

## 2022-07-23 NOTE — Progress Notes (Addendum)
   ORTHOPAEDIC PROGRESS NOTE  s/p Procedure(s): REVERSE SHOULDER ARTHROPLASTY on 07/18/2022 with Dr. Everardo Pacific   SUBJECTIVE: Reports moderate pain about operative site. She had urinary retention over the weekend. She had a foley inserted yesterday. Patient notes she has had urinary retention before and is not surprised by this.   No chest pain. No SOB. No nausea/vomiting. No other complaints. Plan to discharge to SNF.   OBJECTIVE: PE: General: resting in hospital bed, NAD RUE: Dressing CDI and sling well fitting. full and painless ROM throughout hand with DPC of 0. + Motor in  AIN, PIN,. Difficulty with ulnar nerve motor function which she states is chronic. Axillary nerve sensation preserved.  Sensation intact in medial, radial, and ulnar distributions. Well perfused digits.    Vitals:   07/22/22 2103 07/23/22 0606  BP: 120/72 108/66  Pulse: 88 81  Resp: 16 15  Temp: 98.1 F (36.7 C) 98.3 F (36.8 C)  SpO2: 94% 94%   Stable post-op images.   ASSESSMENT: Shirley Mcdonald is a 71 y.o. female   PLAN: Weightbearing:  - NWB in sling until nerve block wears off  - then okay to use arm for ADLs as well as transfers  - physical therapy to start for mobility purposes now but no shoulder range of motion encouraged until 4 weeks after surgery Insicional and dressing care: Reinforce dressings as needed Orthopedic device(s):  Sling Showering: post-op day #2 with assistance VTE prophylaxis: Lovenox Pain control: PRN pain medications, minimize narcotics as able Urinary retention: will need trial foley removal in approximately 2 days. I discussed this with nurse case manager who spoke to SNF. Provider at SNF can perform the voiding trial at SNF. This will not delay her discharge.    Follow - up plan: 1 week in office Dispo: SNF. PT/OT recommending SNF.  Patient was referred to [facility] in [place] prior to surgery. TOC following. Appreciate their assistance.   Contact information:  Weekdays 8-5 Dr. Ramond Marrow, Alfonse Alpers PA-C , After hours and holidays please check [email or web address] for group call information for [facility]   Alfonse Alpers, PA-C 07/23/2022

## 2022-12-30 ENCOUNTER — Emergency Department (HOSPITAL_COMMUNITY): Payer: Medicare Other

## 2022-12-30 ENCOUNTER — Other Ambulatory Visit: Payer: Self-pay

## 2022-12-30 ENCOUNTER — Emergency Department (HOSPITAL_COMMUNITY)
Admission: EM | Admit: 2022-12-30 | Discharge: 2022-12-31 | Disposition: A | Discharge status: Home / Self Care | Payer: Medicare Other | Attending: Emergency Medicine | Admitting: Emergency Medicine

## 2022-12-30 DIAGNOSIS — Z7984 Long term (current) use of oral hypoglycemic drugs: Secondary | ICD-10-CM | POA: Insufficient documentation

## 2022-12-30 DIAGNOSIS — Z79899 Other long term (current) drug therapy: Secondary | ICD-10-CM | POA: Insufficient documentation

## 2022-12-30 DIAGNOSIS — M25552 Pain in left hip: Secondary | ICD-10-CM | POA: Diagnosis not present

## 2022-12-30 DIAGNOSIS — Z7951 Long term (current) use of inhaled steroids: Secondary | ICD-10-CM | POA: Diagnosis not present

## 2022-12-30 DIAGNOSIS — S8992XA Unspecified injury of left lower leg, initial encounter: Secondary | ICD-10-CM | POA: Diagnosis present

## 2022-12-30 DIAGNOSIS — J45909 Unspecified asthma, uncomplicated: Secondary | ICD-10-CM | POA: Diagnosis not present

## 2022-12-30 DIAGNOSIS — Y92810 Car as the place of occurrence of the external cause: Secondary | ICD-10-CM | POA: Insufficient documentation

## 2022-12-30 DIAGNOSIS — W19XXXA Unspecified fall, initial encounter: Secondary | ICD-10-CM | POA: Diagnosis not present

## 2022-12-30 DIAGNOSIS — Z96611 Presence of right artificial shoulder joint: Secondary | ICD-10-CM | POA: Insufficient documentation

## 2022-12-30 DIAGNOSIS — I10 Essential (primary) hypertension: Secondary | ICD-10-CM | POA: Insufficient documentation

## 2022-12-30 DIAGNOSIS — I77810 Thoracic aortic ectasia: Secondary | ICD-10-CM | POA: Diagnosis not present

## 2022-12-30 DIAGNOSIS — Z794 Long term (current) use of insulin: Secondary | ICD-10-CM | POA: Diagnosis not present

## 2022-12-30 DIAGNOSIS — E039 Hypothyroidism, unspecified: Secondary | ICD-10-CM | POA: Diagnosis not present

## 2022-12-30 DIAGNOSIS — E119 Type 2 diabetes mellitus without complications: Secondary | ICD-10-CM | POA: Insufficient documentation

## 2022-12-30 DIAGNOSIS — M25462 Effusion, left knee: Secondary | ICD-10-CM

## 2022-12-30 DIAGNOSIS — S8392XA Sprain of unspecified site of left knee, initial encounter: Secondary | ICD-10-CM

## 2022-12-30 DIAGNOSIS — E871 Hypo-osmolality and hyponatremia: Secondary | ICD-10-CM | POA: Diagnosis not present

## 2022-12-30 MED ORDER — KETOROLAC TROMETHAMINE 60 MG/2ML IM SOLN
30.0000 mg | Freq: Once | INTRAMUSCULAR | Status: AC
  Administered 2022-12-31: 30 mg via INTRAMUSCULAR
  Filled 2022-12-30: qty 2

## 2022-12-30 MED ORDER — ACETAMINOPHEN 500 MG PO TABS
1000.0000 mg | ORAL_TABLET | Freq: Once | ORAL | Status: AC
  Administered 2022-12-31: 1000 mg via ORAL
  Filled 2022-12-30: qty 2

## 2022-12-30 NOTE — ED Triage Notes (Addendum)
Pt arrives c/o L knee and leg pain after mechanical fall from standing position that occurred around noon today. Pt was attempting to get out of car today when she fell down. Pt also states that she is having some pain around her trunk because her nephew helped her get up off the ground "in a bear hug". Pt states that she was able to stand in crouched position after fall, which is baseline for her. States that she is unable to stand now. Wheelchair confined at baseline, but usually able to stand and pivot.  Took her daily celebrex and tylenol this AM for chronic arthritis pain.

## 2022-12-31 ENCOUNTER — Emergency Department (HOSPITAL_COMMUNITY)
Admission: EM | Admit: 2022-12-31 | Discharge: 2023-01-01 | Disposition: A | Discharge status: Home / Self Care | Payer: Medicare Other | Source: Home / Self Care | Attending: Emergency Medicine | Admitting: Emergency Medicine

## 2022-12-31 DIAGNOSIS — E039 Hypothyroidism, unspecified: Secondary | ICD-10-CM | POA: Insufficient documentation

## 2022-12-31 DIAGNOSIS — M25462 Effusion, left knee: Secondary | ICD-10-CM

## 2022-12-31 DIAGNOSIS — Z794 Long term (current) use of insulin: Secondary | ICD-10-CM | POA: Insufficient documentation

## 2022-12-31 DIAGNOSIS — I77810 Thoracic aortic ectasia: Secondary | ICD-10-CM

## 2022-12-31 DIAGNOSIS — Z7984 Long term (current) use of oral hypoglycemic drugs: Secondary | ICD-10-CM | POA: Insufficient documentation

## 2022-12-31 DIAGNOSIS — E119 Type 2 diabetes mellitus without complications: Secondary | ICD-10-CM | POA: Insufficient documentation

## 2022-12-31 DIAGNOSIS — W19XXXA Unspecified fall, initial encounter: Secondary | ICD-10-CM

## 2022-12-31 DIAGNOSIS — Z79899 Other long term (current) drug therapy: Secondary | ICD-10-CM | POA: Insufficient documentation

## 2022-12-31 DIAGNOSIS — E871 Hypo-osmolality and hyponatremia: Secondary | ICD-10-CM | POA: Insufficient documentation

## 2022-12-31 DIAGNOSIS — I10 Essential (primary) hypertension: Secondary | ICD-10-CM | POA: Insufficient documentation

## 2022-12-31 NOTE — ED Triage Notes (Signed)
Patient arrived via [facility] with complaints of a known knee effusion, arrived to ED due to lack of help at home. States wanting admission to the hospital.

## 2022-12-31 NOTE — ED Notes (Signed)
[facility] called for transportation  

## 2022-12-31 NOTE — ED Provider Notes (Signed)
[facility] AT [facility] Provider Note  CSN: [id number] Arrival date & time: 12/30/22 2248  Chief Complaint(s) Fall and Leg Pain  HPI Shirley Mcdonald is a 72 y.o. female with h/o right AKA and left TKR here after mechanical fall this afternoon resulting in left knee pain. She reports falling and twisting her left leg under her buttock while trying to get in the car. Her family member was able to lift her into the car. This evening while trying to transition and bearing some weight on the leg, she noted severe pain. She also endorses mild left hip pain. No other injuries.    The history is provided by the patient.    Past Medical History Past Medical History:  Diagnosis Date   Alpha (0) thalassemia (HCC)    Minor   Alpha thalassemia minor    Arthritis    Asthma    Complication of anesthesia    Combative   Depression    Diabetes mellitus without complication (HCC)    Diabetes mellitus without complication (HCC)    type 2    Dysrhythmia    Familial Mediterranean fever (HCC)    Headache    History of hiatal hernia    Hypertension    Hypothyroidism    Pneumonia    PONV (postoperative nausea and vomiting) 1979   Sleep apnea    Sleep apnea    No CPOP   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 Authorizing Provider  albuterol (VENTOLIN HFA) 108 (90 Base) MCG/ACT inhaler Inhale 2 puffs into the lungs every 6 (six) hours as needed for wheezing or shortness of breath.    [provider]  amLODipine (NORVASC) 2.5 MG tablet Take 2.5 mg by mouth daily.    [provider]  atorvastatin (LIPITOR) 20 MG tablet Take 20 mg by mouth every evening. 06/24/16   [provider]   B Complex Vitamins (VITAMIN B COMPLEX PO) Take 1 tablet by mouth at bedtime.    [provider]  benzonatate (TESSALON) 100 MG capsule Take 1 capsule (100 mg total) by mouth every 8 (eight) hours. 09/10/20   Palumbo, April, MD  buPROPion (WELLBUTRIN XL) 300 MG 24 hr tablet Take 300 mg by mouth daily. 08/17/16   [provider]  butalbital-acetaminophen-caffeine (FIORICET) 50-325-40 MG tablet Take 1 tablet by mouth every 6 (six) hours as needed for migraine. 03/29/22   [provider]  Calcium Carbonate Antacid (ALKA-SELTZER ANTACID PO) Take 1 tablet by mouth daily as needed (gummie.  chew one tablet if needed for GERD).    [provider]  CALCIUM-VITAMIN D PO Take 1 tablet by mouth in the morning and at bedtime.    [provider]  celecoxib (CELEBREX) 100 MG capsule Take 100 mg by mouth 2 (two) times daily.    [provider]  Cholecalciferol (VITAMIN D3) 125 MCG (5000 UT) TABS Take 5,000 Units by mouth every evening.    [provider]  colchicine 0.6 MG tablet Take 0.6 mg by mouth 2 (two) times daily.    [provider]  diphenhydrAMINE (BENADRYL ALLERGY) 25 mg capsule Take 1 capsule (25 mg total) by mouth every 6 (six) hours as needed for itching. 07/19/22  McBane, Jerald Kief, PA-C  docusate sodium (COLACE) 250 MG capsule Take 250 mg by mouth at bedtime.    [provider]  famotidine (PEPCID) 20 MG tablet Take 20 mg by mouth 2 (two) times daily. 09/12/21   [provider]  insulin lispro (HUMALOG) 100 UNIT/ML KwikPen Inject 5-7 Units into the skin 3 (three) times daily with meals. Sliding scale 08/15/16   [provider]  ipratropium (ATROVENT) 0.02 % nebulizer solution Take 2.5 mLs (0.5 mg total) by nebulization 4 (four) times daily. 09/10/20   Palumbo, April, MD  lactobacillus acidophilus (BACID) TABS tablet Take 1 tablet by mouth daily.    [provider]  lamoTRIgine (LAMICTAL) 150 MG  tablet Take 150 mg by mouth at bedtime.    [provider]  LEVEMIR 100 UNIT/ML injection Inject 17 Units into the skin at bedtime. 02/21/21   [provider]  levothyroxine (SYNTHROID) 50 MCG tablet Take 50 mcg by mouth daily before breakfast.    [provider]  melatonin 5 MG TABS Take 5 mg by mouth at bedtime.    [provider]  metFORMIN (GLUCOPHAGE-XR) 500 MG 24 hr tablet Take 500 mg by mouth 2 (two) times daily with a meal.  07/25/16   [provider]  metoprolol succinate (TOPROL-XL) 25 MG 24 hr tablet Take 25 mg by mouth daily.    [provider]  Multiple Vitamin (MULTIVITAMIN WITH MINERALS) TABS tablet Take 1 tablet by mouth every evening.    [provider]  nortriptyline (PAMELOR) 75 MG capsule Take 75 mg by mouth at bedtime.    [provider]  olmesartan (BENICAR) 20 MG tablet Take 20 mg by mouth daily.    [provider]  oxyCODONE (OXY IR/ROXICODONE) 5 MG immediate release tablet Take 1 pills every 6 hrs as needed for severe pain after surgery 07/19/22   Vernetta Honey, PA-C  pantoprazole (PROTONIX) 40 MG tablet Take 40 mg by mouth at bedtime.    [provider]  traZODone (DESYREL) 50 MG tablet Take 100 mg by mouth at bedtime. 07/06/19   [provider]  UNABLE TO FIND Take 25 mg by mouth in the morning and at bedtime. CBD GUMMY    [provider]  vitamin C (ASCORBIC ACID) 500 MG tablet Take 1,000 mg by mouth every evening.    [provider]                                                                                                                                    Allergies Hydromorphone hcl, Hydrocodone-acetaminophen, Hydromorphone, Lotrel [amlodipine besy-benazepril hcl], Oxycontin [oxycodone hcl], Saccharin, Tape, Adhesive [tape], Amlodipine besy-benazepril hcl, and Saccharin  Review of Systems Review of Systems As noted in HPI  Physical Exam Vital  Signs  I have reviewed the triage vital signs BP (!) 110/96 (BP Location: Right Arm)   Pulse (!) 111  Temp 98.2 F (36.8 C) (Oral)   Resp 18   Ht 5\' 2"  (1.575 m)   Wt 88.5 kg   SpO2 98%   BMI 35.67 kg/m   Physical Exam Vitals reviewed.  Constitutional:      General: She is not in acute distress.    Appearance: She is well-developed. She is not diaphoretic.  HENT:     Head: Normocephalic and atraumatic.     Right Ear: External ear normal.     Left Ear: External ear normal.     Nose: Nose normal.  Eyes:     General: No scleral icterus.    Conjunctiva/sclera: Conjunctivae normal.  Neck:     Trachea: Phonation normal.  Cardiovascular:     Rate and Rhythm: Normal rate and regular rhythm.  Pulmonary:     Effort: Pulmonary effort is normal. No respiratory distress.     Breath sounds: No stridor.  Abdominal:     General: There is no distension.  Musculoskeletal:     Cervical back: Normal range of motion.     Left hip: Tenderness (mild) present.     Left knee: Swelling present. No erythema or lacerations. Decreased range of motion. Tenderness present over the medial joint line and MCL.  Neurological:     Mental Status: She is alert and oriented to person, place, and time.  Psychiatric:        Behavior: Behavior normal.     ED Results and Treatments Labs (all labs ordered are listed, but only abnormal results are displayed) Labs Reviewed - No data to display                                                                                                                       EKG  EKG Interpretation  Date/Time:  Monday Dec 31 2022 00:36:45 EDT Ventricular Rate:  104 PR Interval:  201 QRS Duration: 125 QT Interval:  357 QTC Calculation: 470 R Axis:   5 Text Interpretation: Sinus tachycardia IVCD, consider atypical LBBB No acute changes Confirmed by Drema Pry [phone]) on 12/31/2022 1:01:46 AM       Radiology DG HIP UNILAT W OR W/O PELVIS 2-3 VIEWS  LEFT  Result Date: 12/31/2022 CLINICAL DATA:  Recent fall with left hip pain, initial encounter EXAM: DG HIP (WITH OR WITHOUT PELVIS) 2-3V LEFT COMPARISON:  None Available. FINDINGS: Pelvic ring is intact. Considerable postoperative changes are noted in the pelvis. Severe degenerative changes of left hip joint are noted with loss of joint space superiorly with subchondral sclerosis and cyst formation as well as remodeling of the femoral head. No fracture is seen. No soft tissue abnormality is noted. IMPRESSION: Degenerative changes of the left hip joint with remodeling of the left femoral head. No acute fracture is seen. Electronically Signed   By: Alcide Clever M.D.   On: 12/31/2022 00:25   DG Knee Complete 4 Views Left  Result Date: 12/31/2022 CLINICAL DATA:  Status post fall. EXAM: LEFT KNEE -  COMPLETE 4+ VIEW COMPARISON:  None Available. FINDINGS: A left knee replacement is seen without evidence of surrounding lucency to suggest the presence of hardware loosening or infection. No evidence of an acute fracture or dislocation. A chronic deformity is seen along the dorsal aspect of the left patella. A small to moderate sized joint effusion is noted. IMPRESSION: 1. Intact left knee replacement without evidence of hardware loosening or infection. 2. Small to moderate-sized left knee effusion. Electronically Signed   By: Aram Candela M.D.   On: 12/31/2022 00:24    Medications Ordered in ED Medications  ketorolac (TORADOL) injection 30 mg (30 mg Intramuscular Given 12/31/22 0028)  acetaminophen (TYLENOL) tablet 1,000 mg (1,000 mg Oral Given 12/31/22 0028)                                                                                                                                     Procedures Procedures  (including critical care time)  Medical Decision Making / ED Course  Click here for ABCD2, HEART and other calculators  Medical Decision Making Amount and/or Complexity of Data  Reviewed Radiology: ordered and independent interpretation performed. Decision-making details documented in ED Course.  Risk OTC drugs. Prescription drug management.    No acute injuries from the fall. Given IM toradol and oral tylenol for pain. RICE with knee brace recommended. Recommend ortho follow up with her orthopedic surgeon.      Final Clinical Impression(s) / ED Diagnoses Final diagnoses:  Fall, initial encounter  Sprain of left knee, unspecified ligament, initial encounter  Effusion of left knee   The patient appears reasonably screened and/or stabilized for discharge and I doubt any other medical condition or other [facility] requiring further screening, evaluation, or treatment in the ED at this time. I have discussed the findings, Dx and Tx plan with the patient/family who expressed understanding and agree(s) with the plan. Discharge instructions discussed at length. The patient/family was given strict return precautions who verbalized understanding of the instructions. No further questions at time of discharge.  Disposition: Discharge  Condition: Good  ED Discharge Orders     None        Follow Up: Eartha Inch, MD [address] [place] [address] [phone]  Call  as needed  Specialists, [facility] [address] [place] [address] [phone]  Call  to schedule an appointment for close follow up           This chart was dictated using voice recognition software.  Despite best efforts to proofread,  errors can occur which can change the documentation meaning.    Nira Conn, MD 12/31/22 [phone]

## 2022-12-31 NOTE — Discharge Instructions (Addendum)
For pain control you may take 1000 mg of Tylenol every 8 hours as needed. 

## 2023-01-01 ENCOUNTER — Emergency Department (HOSPITAL_COMMUNITY): Payer: Medicare Other

## 2023-01-01 ENCOUNTER — Encounter (HOSPITAL_COMMUNITY): Payer: Self-pay

## 2023-01-01 LAB — BASIC METABOLIC PANEL
Anion gap: 9 (ref 5–15)
BUN: 12 mg/dL (ref 8–23)
CO2: 24 mmol/L (ref 22–32)
Calcium: 9.3 mg/dL (ref 8.9–10.3)
Chloride: 101 mmol/L (ref 98–111)
Creatinine, Ser: 0.51 mg/dL (ref 0.44–1.00)
GFR, Estimated: >60 mL/min (ref >60)
Glucose, Bld: 167 mg/dL — ABNORMAL HIGH (ref 70–99)
Potassium: 3.7 mmol/L (ref 3.5–5.1)
Sodium: 134 mmol/L — ABNORMAL LOW (ref 135–145)

## 2023-01-01 LAB — CBC WITH DIFFERENTIAL/PLATELET
Abs Immature Granulocytes: 0.05 10*3/uL (ref 0.00–0.07)
Basophils Absolute: 0.1 10*3/uL (ref 0.0–0.1)
Basophils Relative: 1 %
Eosinophils Absolute: 0.4 10*3/uL (ref 0.0–0.5)
Eosinophils Relative: 5 %
HCT: 38.8 % (ref 36.0–46.0)
Hemoglobin: 13.5 g/dL (ref 12.0–15.0)
Immature Granulocytes: 1 %
Lymphocytes Relative: 13 %
Lymphs Abs: 1.3 10*3/uL (ref 0.7–4.0)
MCH: 28.4 pg (ref 26.0–34.0)
MCHC: 34.8 g/dL (ref 30.0–36.0)
MCV: 81.7 fL (ref 80.0–100.0)
Monocytes Absolute: 0.8 10*3/uL (ref 0.1–1.0)
Monocytes Relative: 8 %
Neutro Abs: 6.8 10*3/uL (ref 1.7–7.7)
Neutrophils Relative %: 72 %
Platelets: 201 10*3/uL (ref 150–400)
RBC: 4.75 MIL/uL (ref 3.87–5.11)
RDW: 16.3 % — ABNORMAL HIGH (ref 11.5–15.5)
WBC: 9.4 10*3/uL (ref 4.0–10.5)
nRBC: 0 % (ref 0.0–0.2)

## 2023-01-01 LAB — D-DIMER, QUANTITATIVE: D-Dimer, Quant: 0.86 ug/mL-FEU — ABNORMAL HIGH (ref 0.00–0.50)

## 2023-01-01 LAB — CBG MONITORING, ED
Glucose-Capillary: 139 mg/dL — ABNORMAL HIGH (ref 70–99)
Glucose-Capillary: 189 mg/dL — ABNORMAL HIGH (ref 70–99)

## 2023-01-01 LAB — TROPONIN I (HIGH SENSITIVITY): Troponin I (High Sensitivity): 5 ng/L (ref <18)

## 2023-01-01 MED ORDER — NORTRIPTYLINE HCL 25 MG PO CAPS
75.0000 mg | ORAL_CAPSULE | Freq: Every day | ORAL | Status: DC
  Filled 2023-01-01: qty 3

## 2023-01-01 MED ORDER — OXYCODONE HCL 5 MG PO TABS
5.0000 mg | ORAL_TABLET | Freq: Four times a day (QID) | ORAL | Status: DC | PRN
  Administered 2023-01-01 (×3): 5 mg via ORAL
  Filled 2023-01-01 (×3): qty 1

## 2023-01-01 MED ORDER — LEVOTHYROXINE SODIUM 50 MCG PO TABS: 50.0000 ug | ORAL_TABLET | Freq: Every day | ORAL | Status: DC

## 2023-01-01 MED ORDER — TRAMADOL HCL 50 MG PO TABS
50.0000 mg | ORAL_TABLET | Freq: Once | ORAL | Status: AC
  Administered 2023-01-01: 50 mg via ORAL
  Filled 2023-01-01: qty 1

## 2023-01-01 MED ORDER — MELATONIN 5 MG PO TABS: 5.0000 mg | ORAL_TABLET | Freq: Every day | ORAL | Status: DC

## 2023-01-01 MED ORDER — BUPROPION HCL ER (XL) 150 MG PO TB24
300.0000 mg | ORAL_TABLET | Freq: Every day | ORAL | Status: DC
  Administered 2023-01-01: 300 mg via ORAL
  Filled 2023-01-01: qty 2

## 2023-01-01 MED ORDER — VITAMIN D3 25 MCG (1000 UNIT) PO TABS
5000.0000 [IU] | ORAL_TABLET | Freq: Every evening | ORAL | Status: DC
  Filled 2023-01-01: qty 5

## 2023-01-01 MED ORDER — ATORVASTATIN CALCIUM 10 MG PO TABS: 20.0000 mg | ORAL_TABLET | Freq: Every evening | ORAL | Status: DC

## 2023-01-01 MED ORDER — ACETAMINOPHEN 325 MG PO TABS
650.0000 mg | ORAL_TABLET | Freq: Four times a day (QID) | ORAL | Status: DC | PRN
  Administered 2023-01-01: 650 mg via ORAL
  Filled 2023-01-01: qty 2

## 2023-01-01 MED ORDER — METFORMIN HCL ER 500 MG PO TB24: 500.0000 mg | ORAL_TABLET | Freq: Two times a day (BID) | ORAL | Status: DC

## 2023-01-01 MED ORDER — DOCUSATE SODIUM 50 MG PO CAPS
250.0000 mg | ORAL_CAPSULE | Freq: Every day | ORAL | Status: DC
  Filled 2023-01-01: qty 1

## 2023-01-01 MED ORDER — IPRATROPIUM BROMIDE 0.02 % IN SOLN: 0.5000 mg | Freq: Four times a day (QID) | RESPIRATORY_TRACT | Status: DC

## 2023-01-01 MED ORDER — IOHEXOL 350 MG/ML SOLN
75.0000 mL | Freq: Once | INTRAVENOUS | Status: AC | PRN
  Administered 2023-01-01: 75 mL via INTRAVENOUS

## 2023-01-01 MED ORDER — INSULIN ASPART 100 UNIT/ML IJ SOLN
0.0000 [IU] | Freq: Three times a day (TID) | INTRAMUSCULAR | Status: DC
  Administered 2023-01-01: 3 [IU] via SUBCUTANEOUS
  Administered 2023-01-01: 2 [IU] via SUBCUTANEOUS
  Filled 2023-01-01: qty 0.15

## 2023-01-01 MED ORDER — INSULIN LISPRO (1 UNIT DIAL) 100 UNIT/ML (KWIKPEN): 5.0000 [IU] | PEN_INJECTOR | Freq: Three times a day (TID) | SUBCUTANEOUS | Status: DC

## 2023-01-01 MED ORDER — METOPROLOL SUCCINATE ER 25 MG PO TB24
25.0000 mg | ORAL_TABLET | Freq: Every day | ORAL | Status: DC
  Administered 2023-01-01: 25 mg via ORAL
  Filled 2023-01-01: qty 1

## 2023-01-01 MED ORDER — INSULIN DETEMIR 100 UNIT/ML ~~LOC~~ SOLN: 17.0000 [IU] | Freq: Every day | SUBCUTANEOUS | Status: DC

## 2023-01-01 MED ORDER — CELECOXIB 100 MG PO CAPS
100.0000 mg | ORAL_CAPSULE | Freq: Two times a day (BID) | ORAL | Status: DC
  Administered 2023-01-01: 100 mg via ORAL
  Filled 2023-01-01 (×2): qty 1

## 2023-01-01 MED ORDER — ALBUTEROL SULFATE HFA 108 (90 BASE) MCG/ACT IN AERS: 2.0000 | INHALATION_SPRAY | Freq: Four times a day (QID) | RESPIRATORY_TRACT | Status: DC | PRN

## 2023-01-01 MED ORDER — IRBESARTAN 150 MG PO TABS
150.0000 mg | ORAL_TABLET | Freq: Every day | ORAL | Status: DC
  Administered 2023-01-01: 150 mg via ORAL
  Filled 2023-01-01: qty 1

## 2023-01-01 MED ORDER — LAMOTRIGINE 100 MG PO TABS: 200.0000 mg | ORAL_TABLET | Freq: Every day | ORAL | Status: DC

## 2023-01-01 MED ORDER — DIPHENHYDRAMINE HCL 25 MG PO CAPS: 25.0000 mg | ORAL_CAPSULE | Freq: Four times a day (QID) | ORAL | Status: DC | PRN

## 2023-01-01 MED ORDER — PANTOPRAZOLE SODIUM 40 MG PO TBEC: 40.0000 mg | DELAYED_RELEASE_TABLET | Freq: Every day | ORAL | Status: DC

## 2023-01-01 MED ORDER — LEVOTHYROXINE SODIUM 50 MCG PO TABS
50.0000 ug | ORAL_TABLET | Freq: Every day | ORAL | Status: DC
  Administered 2023-01-01: 50 ug via ORAL
  Filled 2023-01-01: qty 1

## 2023-01-01 MED ORDER — ACETAMINOPHEN 500 MG PO TABS
1000.0000 mg | ORAL_TABLET | Freq: Once | ORAL | Status: AC
  Administered 2023-01-01: 1000 mg via ORAL
  Filled 2023-01-01: qty 2

## 2023-01-01 MED ORDER — FAMOTIDINE 20 MG PO TABS
20.0000 mg | ORAL_TABLET | Freq: Two times a day (BID) | ORAL | Status: DC
  Administered 2023-01-01 (×2): 20 mg via ORAL
  Filled 2023-01-01 (×2): qty 1

## 2023-01-01 MED ORDER — AMLODIPINE BESYLATE 5 MG PO TABS
2.5000 mg | ORAL_TABLET | Freq: Every day | ORAL | Status: DC
  Administered 2023-01-01: 2.5 mg via ORAL
  Filled 2023-01-01: qty 1

## 2023-01-01 MED ORDER — ADULT MULTIVITAMIN W/MINERALS CH: 1.0000 | ORAL_TABLET | Freq: Every evening | ORAL | Status: DC

## 2023-01-01 MED ORDER — INSULIN GLARGINE-YFGN 100 UNIT/ML ~~LOC~~ SOLN
17.0000 [IU] | Freq: Every day | SUBCUTANEOUS | Status: DC
  Filled 2023-01-01: qty 0.17

## 2023-01-01 NOTE — Progress Notes (Addendum)
Presented bed offers to pt. Pt has chosen Shirley Mcdonald at this time. TOC to initiate Auth.  Addend @ 11:04 AM Auth initiated.

## 2023-01-01 NOTE — ED Provider Notes (Signed)
5:47 AM Patient has been medically cleared and while waiting on TOC and PT/OT, patient complained of chest pain.  Patient's pain is reproducible.  She was complaining of pain earlier due to heavy squeeze and "bearhug" from family member. She was complained to nurses difficulty breathing.  EKG with no significant changes from prior, no evidence suggestive of ACS.  Sending for troponin and chest x-ray as well as D dimer due to bed bound status, ongoing mild tachycardia, and SOB.  7:09 AM D-dimer positive.  Troponin 5 and unremarkable, does not need repeat Pete as pain has been ongoing since yesterday.  Not concern for ACS.  CTA PE study ordered.  Signed out to Dr Criss Alvine pending CTAPE. If negative, medically cleared for [facility] and PT/OT for SNF placement.    Mardene Sayer, MD 01/01/23 [phone]

## 2023-01-01 NOTE — ED Provider Notes (Addendum)
Patient's CTA shows no PE. She was made aware of the aortic ectasia and need for repeat CT in a year.  She does note that she has been having some chest pressure since her fall 2 days ago.  Her troponin is negative after many hours of symptoms and her EKG does not show any obvious ischemia.  Her CT does show that she likely has some coronary disease but I do not think this is active today.  She does state that she has a cardiology follow-up coming up on 5/16.  I encouraged her to keep this appointment and at this point she appears medically stable for placement.   Pricilla Loveless, MD 01/01/23 [phone]   Patient accepted to SNF. She feels well at this time. VS are stable. D/c with return precautions.    Pricilla Loveless, MD 01/01/23 [phone]

## 2023-01-01 NOTE — Progress Notes (Addendum)
Pt's auth approved. EDP and RN made aware of pt desat while this CSW was present in room. Pt has completed intake paperwork for [employer] [employer]. [facility] to transport.   Addend @ 1:51 PM Per MD, pt stable for d/c and not requiring o2 at this time. Provided RN with room assignment and report #. [facility] called.

## 2023-01-01 NOTE — [place] FL2 (Signed)
[place] MEDICAID FL2 LEVEL OF CARE FORM     IDENTIFICATION  Patient Name: Shirley Mcdonald Birthdate: [birth year] Sex: female Admission Date (Current Location): 12/31/2022  [facility] and [place] Number:  Producer, television/film/video and Address:  [facility],  [address] 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   Obstructive sleep apnea 02/25/2020   Closed fracture of left proximal humerus 02/11/2019    Orientation RESPIRATION BLADDER Height & Weight     Self, Time, Situation, Place  Normal Continent Weight: 195 lb (88.5 kg) Height:  5\' 2"  (157.5 cm)  BEHAVIORAL SYMPTOMS/MOOD NEUROLOGICAL BOWEL NUTRITION STATUS      Continent Diet (Regular)  AMBULATORY STATUS COMMUNICATION OF NEEDS Skin   Extensive Assist Verbally Normal                       Personal Care Assistance Level of Assistance  Bathing, Feeding, Dressing Bathing Assistance: Maximum assistance Feeding assistance: Limited assistance Dressing Assistance: Maximum assistance     Functional Limitations Info  Sight, Hearing, Speech Sight Info: Adequate Hearing Info: Adequate Speech Info: Adequate    SPECIAL CARE FACTORS FREQUENCY  PT (By licensed PT), OT (By licensed OT)     PT Frequency: x5/week OT Frequency: x5/week            Contractures Contractures Info: Not present    Additional Factors Info  Code Status, Allergies, Psychotropic Code Status Info: Full Allergies Info: Hydromorphone Hcl  Hydrocodone-acetaminophen   Hydromorphone  Lotrel (Amlodipine Besy-benazepril Hcl)  Oxycontin (Oxycodone Hcl)  Saccharin  Tape  Adhesive (Tape)  Amlodipine Besy-benazepril Hcl  Saccharin Psychotropic Info: Wellbutrin Lamictal         Current Medications (01/01/2023):  This is the current hospital active medication list Current Facility-Administered Medications  Medication Dose Route Frequency Provider Last Rate Last Admin   acetaminophen (TYLENOL) tablet 650 mg  650 mg Oral Q6H PRN Mardene Sayer, MD       albuterol (VENTOLIN HFA) 108 (90 Base) MCG/ACT inhaler 2 puff  2 puff Inhalation Q6H PRN Vivien Rossetti C, MD       amLODipine (NORVASC) tablet 2.5 mg  2.5 mg Oral Daily Vivien Rossetti C, MD   2.5 mg at 01/01/23 1034   atorvastatin (LIPITOR) tablet 20 mg  20 mg Oral QPM Vivien Rossetti C, MD       buPROPion (WELLBUTRIN XL) 24 hr tablet 300 mg  300 mg Oral Daily Vivien Rossetti C, MD   300 mg at 01/01/23 1034   celecoxib (CELEBREX) capsule 100 mg  100 mg Oral BID Vivien Rossetti C, MD   100 mg at 01/01/23 8469   cholecalciferol (VITAMIN D3) tablet 5,000 Units  5,000 Units Oral QPM Vivien Rossetti C, MD       diphenhydrAMINE (BENADRYL) capsule 25 mg  25 mg Oral Q6H PRN Mardene Sayer, MD       docusate sodium (COLACE) capsule 250 mg  250 mg Oral QHS Mardene Sayer, MD  famotidine (PEPCID) tablet 20 mg  20 mg Oral BID Vivien Rossetti C, MD   20 mg at 01/01/23 1034   insulin aspart (novoLOG) injection 0-15 Units  0-15 Units Subcutaneous TID WC Mardene Sayer, MD   2 Units at 01/01/23 0745   insulin glargine-yfgn (SEMGLEE) injection 17 Units  17 Units Subcutaneous QHS Mardene Sayer, MD       irbesartan (AVAPRO) tablet 150 mg  150 mg Oral Daily Vivien Rossetti C, MD   150 mg at 01/01/23 1034   lamoTRIgine (LAMICTAL) tablet 200 mg  200 mg Oral QHS Mardene Sayer, MD       levothyroxine (SYNTHROID) tablet 50 mcg  50 mcg Oral Q0600 Mardene Sayer, MD   50 mcg at 01/01/23  4782   melatonin tablet 5 mg  5 mg Oral QHS Mardene Sayer, MD       metoprolol succinate (TOPROL-XL) 24 hr tablet 25 mg  25 mg Oral Daily Vivien Rossetti C, MD   25 mg at 01/01/23 1034   multivitamin with minerals tablet 1 tablet  1 tablet Oral QPM Mardene Sayer, MD       nortriptyline (PAMELOR) capsule 75 mg  75 mg Oral QHS Vivien Rossetti C, MD       oxyCODONE (Oxy IR/ROXICODONE) immediate release tablet 5 mg  5 mg Oral Q6H PRN Mardene Sayer, MD   5 mg at 01/01/23 1038   pantoprazole (PROTONIX) EC tablet 40 mg  40 mg Oral QHS Mardene Sayer, MD       Current Outpatient Medications  Medication Sig Dispense Refill   acetaminophen (TYLENOL) 500 MG tablet Take 500 mg by mouth every 6 (six) hours as needed for moderate pain.     amLODipine (NORVASC) 2.5 MG tablet Take 2.5 mg by mouth daily.     atorvastatin (LIPITOR) 20 MG tablet Take 20 mg by mouth every evening.     B Complex Vitamins (VITAMIN B COMPLEX PO) Take 1 tablet by mouth at bedtime.     buPROPion (WELLBUTRIN XL) 300 MG 24 hr tablet Take 300 mg by mouth daily.     butalbital-acetaminophen-caffeine (FIORICET) 50-325-40 MG tablet Take 1 tablet by mouth every 6 (six) hours as needed for migraine.     Calcium Carbonate Antacid (ALKA-SELTZER ANTACID PO) Take 1 tablet by mouth daily as needed (gummie.  chew one tablet if needed for GERD).     CALCIUM-VITAMIN D PO Take 1 tablet by mouth in the morning and at bedtime.     celecoxib (CELEBREX) 100 MG capsule Take 100 mg by mouth 2 (two) times daily.     cetirizine (ZYRTEC) 10 MG tablet Take 10 mg by mouth daily.     Cholecalciferol (VITAMIN D3) 125 MCG (5000 UT) TABS Take 5,000 Units by mouth every evening.     colchicine 0.6 MG tablet Take 0.6 mg by mouth 2 (two) times daily.     docusate sodium (COLACE) 250 MG capsule Take 250 mg by mouth at bedtime.     famotidine (PEPCID) 20 MG tablet Take 20 mg by mouth 2 (two) times daily.     insulin lispro (HUMALOG) 100  UNIT/ML KwikPen Inject 2-6 Units into the skin 3 (three) times daily with meals. Sliding scale 150-200=2 units, 201-250=4 units, 251-300=6 units     lactobacillus acidophilus (BACID) TABS tablet Take 1 tablet by mouth daily.     lamoTRIgine (LAMICTAL) 200 MG tablet Take 200 mg by mouth every evening.  LANTUS SOLOSTAR 100 UNIT/ML Solostar Pen Inject 17 Units into the skin at bedtime.     levothyroxine (SYNTHROID) 50 MCG tablet Take 50 mcg by mouth daily before breakfast.     LORazepam (ATIVAN) 0.5 MG tablet Take 0.5 mg by mouth daily as needed for anxiety.     melatonin 5 MG TABS Take 5 mg by mouth at bedtime.     metFORMIN (GLUCOPHAGE-XR) 500 MG 24 hr tablet Take 500 mg by mouth 2 (two) times daily with a meal.     metoprolol succinate (TOPROL-XL) 25 MG 24 hr tablet Take 25 mg by mouth daily.     Multiple Vitamin (MULTIVITAMIN WITH MINERALS) TABS tablet Take 1 tablet by mouth every evening.     nortriptyline (PAMELOR) 75 MG capsule Take 75 mg by mouth at bedtime.     olmesartan (BENICAR) 20 MG tablet Take 20 mg by mouth daily.     pantoprazole (PROTONIX) 40 MG tablet Take 40 mg by mouth at bedtime.     traZODone (DESYREL) 50 MG tablet Take 100 mg by mouth at bedtime.     vitamin C (ASCORBIC ACID) 500 MG tablet Take 1,000 mg by mouth every evening.     benzonatate (TESSALON) 100 MG capsule Take 1 capsule (100 mg total) by mouth every 8 (eight) hours. (Patient not taking: Reported on 01/01/2023) 21 capsule 0   ipratropium (ATROVENT) 0.02 % nebulizer solution Take 2.5 mLs (0.5 mg total) by nebulization 4 (four) times daily. (Patient not taking: Reported on 01/01/2023) 75 mL 0   oxyCODONE (OXY IR/ROXICODONE) 5 MG immediate release tablet Take 1 pills every 6 hrs as needed for severe pain after surgery (Patient not taking: Reported on 01/01/2023) 30 tablet 0     Discharge Medications: Please see discharge summary for a list of discharge medications.  Relevant Imaging Results:  Relevant Lab  Results:   Additional Information SSN: [id number]  Princella Ion, LCSW

## 2023-01-01 NOTE — Evaluation (Signed)
Physical Therapy Evaluation Patient Details Name: Shirley Mcdonald MRN: [id number] DOB: Apr 10, 1951 Today's Date: 01/01/2023  History of Present Illness  Shirley Mcdonald is a 72 y.o. female.  With PMH of DM, hypothyroidism, HTN, right AKA, left TKR who presents with ongoing left knee pain and effusion and difficulty performing ADLs since fall yesterday.  She was seen in ER 5/12, had x-rays done of left knee and left hip without any acute traumatic fracture or dislocation. Seen at [facility]/13 who placed KI, per pt" Let the knee rest">  Clinical Impression  Pt admitted with above diagnosis.  Pt currently with functional limitations due to the deficits listed below (see PT Problem List). Pt will benefit from acute skilled PT to increase their independence and safety with mobility to allow discharge.     The patient  reports have several falls with resulting Left knee pain and unable to bear  weight for transfers. Patient has a KI on LLE which was placed by ortho after visit to [facility] 5/13. Per patient,  ,was told to let the "knee rest" . Per tests, no injury -effusion present.  Patient is on a high stretcher so unable to safely attempt transfers.  Patient resides in [facility] and was mod I with transfers PTA.  Patient will benefit from PT while in acute care and after DC.   Recommendations for follow up therapy are one component of a multi-disciplinary discharge planning process, led by the attending physician.  Recommendations may be updated based on patient status, additional functional criteria and insurance authorization.  Follow Up Recommendations Can patient physically be transported by private vehicle: No     Assistance Recommended at Discharge Frequent or constant Supervision/Assistance  Patient can return home with the following  Two people to help with walking and/or transfers;A little help with bathing/dressing/bathroom;Help with stairs or ramp for entrance;Assist for  transportation    Equipment Recommendations None recommended by PT  Recommendations for Other Services       Functional Status Assessment Patient has had a recent decline in their functional status and demonstrates the ability to make significant improvements in function in a reasonable and predictable amount of time.     Precautions / Restrictions Precautions Precautions: Fall Required Braces or Orthoses: Knee Immobilizer - Left Restrictions Other Position/Activity Restrictions: no specific order for KI, per pt. , placed at  ortho visit and was told" let the Knee rest."      Mobility  Bed Mobility Overal bed mobility: Needs Assistance Bed Mobility: Rolling, Supine to Sit, Sit to Supine Rolling: Min assist   Supine to sit: Min assist, HOB elevated Sit to supine: Min assist   General bed mobility comments: use of bed rails for rolling  and sitting    Transfers                   General transfer comment: NT due to stretcher height and  patient stature    Ambulation/Gait                  Stairs            Wheelchair Mobility    Modified Rankin (Stroke Patients Only)       Balance Overall balance assessment: Needs assistance Sitting-balance support: Feet unsupported Sitting balance-Leahy Scale: Good Sitting balance - Comments: able to shift /lean to each side  Pertinent Vitals/Pain Pain Assessment Pain Score: 4  Pain Location: left knee Pain Descriptors / Indicators: Discomfort Pain Intervention(s): Monitored during session    Home Living Family/patient expects to be discharged to:: Private residence Living Arrangements: Alone   Type of Home: Independent living facility Home Access: Level entry       Home Layout: One level Home Equipment: Wheelchair - power Additional Comments: has two cats at home, siding board    Prior Function Prior Level of Function : Independent/Modified  Independent             Mobility Comments: She performs stand-pivot transfers into and out of her chair power wheelchair, which she uses for mobility inside her home. sometimes uses slideboard "cause my RLE is non existant" ADLs Comments: She was modified independent to independent with ADLs. Meals are provided by the facility and the staff also assists with cleaning 1x per week.     Hand Dominance   Dominant Hand: Left    Extremity/Trunk Assessment        Lower Extremity Assessment Lower Extremity Assessment: RLE deficits/detail;LLE deficits/detail RLE Deficits / Details: high AKA LLE Deficits / Details: removed KI, Knee flexion to ~60*, able to  extend knee, uses hand to lift leg.    Cervical / Trunk Assessment Cervical / Trunk Assessment: Normal  Communication   Communication: No difficulties  Cognition Arousal/Alertness: Awake/alert Behavior During Therapy: WFL for tasks assessed/performed Overall Cognitive Status: Within Functional Limits for tasks assessed                                          General Comments      Exercises     Assessment/Plan    PT Assessment Patient needs continued PT services  PT Problem List Decreased strength;Decreased mobility;Decreased safety awareness;Decreased range of motion;Decreased knowledge of precautions;Decreased activity tolerance;Pain       PT Treatment Interventions DME instruction;Therapeutic activities;Therapeutic exercise;Patient/family education;Functional mobility training;Wheelchair mobility training    PT Goals (Current goals can be found in the Care Plan section)  Acute Rehab PT Goals Patient Stated Goal: to go home PT Goal Formulation: With patient Time For Goal Achievement: 01/15/23 Potential to Achieve Goals: Good    Frequency Min 1X/week     Co-evaluation PT/OT/SLP Co-Evaluation/Treatment: Yes Reason for Co-Treatment: For patient/therapist safety PT goals addressed during  session: Mobility/safety with mobility OT goals addressed during session: ADL's and self-care       AM-PAC PT "6 Clicks" Mobility  Outcome Measure Help needed turning from your back to your side while in a flat bed without using bedrails?: A Little Help needed moving from lying on your back to sitting on the side of a flat bed without using bedrails?: A Little Help needed moving to and from a bed to a chair (including a wheelchair)?: Total Help needed standing up from a chair using your arms (e.g., wheelchair or bedside chair)?: Total Help needed to walk in hospital room?: Total Help needed climbing 3-5 steps with a railing? : Total 6 Click Score: 10    End of Session   Activity Tolerance: Patient tolerated treatment well Patient left: in bed;with call bell/phone within reach Nurse Communication: Mobility status PT Visit Diagnosis: Unsteadiness on feet (R26.81);Difficulty in walking, not elsewhere classified (R26.2);Pain Pain - Right/Left: Left Pain - part of body: Knee    Time: 1610-9604 PT Time Calculation (min) (ACUTE ONLY): 22 min  Charges:   PT Evaluation $PT Eval Low Complexity: 1 Low          Blanchard Kelch PT Acute Rehabilitation Services Office [phone] Weekend [phone]   Rada Hay 01/01/2023, 9:55 AM

## 2023-01-01 NOTE — ED Provider Notes (Signed)
[employer] EMERGENCY DEPARTMENT AT [facility] Provider Note   CSN: [id number] Arrival date & time: 12/31/22  2245     History  Chief Complaint  Patient presents with   Knee Problem    Shirley Mcdonald is a 72 y.o. female.  With PMH of DM, hypothyroidism, HTN, right AKA, left TKR who presents with ongoing left knee pain and effusion and difficulty performing ADLs since fall yesterday.  She was seen in ER yesterday and had x-rays done of left knee and left hip without any acute traumatic fracture or dislocation.  Patient said she fell trying to get out of the car and twisted her left leg under her buttock and hurting her left knee and developing effusion.  No loss of sensation or weakness.  She has pain with any type of range of motion of left knee or movement.  She saw orthopedics today who did ultrasound of her knee to make sure she had no tendinous injury which she was told was negative.  She is not on any anticoagulation.  She does take aspirin daily.  No head trauma or loss of consciousness or other injury reported.  She is here because she lives alone and has no helpers at home and has been having difficulty completing any ADLs due to her left knee pain.  She was given tramadol with relief.  HPI     Home Medications Prior to Admission medications   Medication Sig Start Date End Date Taking? Authorizing Provider  albuterol (VENTOLIN HFA) 108 (90 Base) MCG/ACT inhaler Inhale 2 puffs into the lungs every 6 (six) hours as needed for wheezing or shortness of breath.    [provider]  amLODipine (NORVASC) 2.5 MG tablet Take 2.5 mg by mouth daily.    [provider]  atorvastatin (LIPITOR) 20 MG tablet Take 20 mg by mouth every evening. 06/24/16   [provider]  B Complex Vitamins (VITAMIN B COMPLEX PO) Take 1 tablet by mouth at bedtime.    [provider]  benzonatate (TESSALON) 100 MG capsule Take 1 capsule (100 mg total) by mouth every 8  (eight) hours. 09/10/20   Palumbo, April, MD  buPROPion (WELLBUTRIN XL) 300 MG 24 hr tablet Take 300 mg by mouth daily. 08/17/16   [provider]  butalbital-acetaminophen-caffeine (FIORICET) 50-325-40 MG tablet Take 1 tablet by mouth every 6 (six) hours as needed for migraine. 03/29/22   [provider]  Calcium Carbonate Antacid (ALKA-SELTZER ANTACID PO) Take 1 tablet by mouth daily as needed (gummie.  chew one tablet if needed for GERD).    [provider]  CALCIUM-VITAMIN D PO Take 1 tablet by mouth in the morning and at bedtime.    [provider]  celecoxib (CELEBREX) 100 MG capsule Take 100 mg by mouth 2 (two) times daily.    [provider]  Cholecalciferol (VITAMIN D3) 125 MCG (5000 UT) TABS Take 5,000 Units by mouth every evening.    [provider]  colchicine 0.6 MG tablet Take 0.6 mg by mouth 2 (two) times daily.    [provider]  diphenhydrAMINE (BENADRYL ALLERGY) 25 mg capsule Take 1 capsule (25 mg total) by mouth every 6 (six) hours as needed for itching. 07/19/22   McBane, Jerald Kief, PA-C  docusate sodium (COLACE) 250 MG capsule Take 250 mg by mouth at bedtime.    [provider]  famotidine (PEPCID) 20 MG tablet Take 20 mg by mouth 2 (two) times daily. 09/12/21  [provider]  insulin lispro (HUMALOG) 100 UNIT/ML KwikPen Inject 5-7 Units into the skin 3 (three) times daily with meals. Sliding scale 08/15/16   [provider]  ipratropium (ATROVENT) 0.02 % nebulizer solution Take 2.5 mLs (0.5 mg total) by nebulization 4 (four) times daily. 09/10/20   Palumbo, April, MD  lactobacillus acidophilus (BACID) TABS tablet Take 1 tablet by mouth daily.    [provider]  lamoTRIgine (LAMICTAL) 150 MG tablet Take 150 mg by mouth at bedtime.    [provider]  LEVEMIR 100 UNIT/ML injection Inject 17 Units into the skin at bedtime. 02/21/21   [provider]  levothyroxine  (SYNTHROID) 50 MCG tablet Take 50 mcg by mouth daily before breakfast.    [provider]  melatonin 5 MG TABS Take 5 mg by mouth at bedtime.    [provider]  metFORMIN (GLUCOPHAGE-XR) 500 MG 24 hr tablet Take 500 mg by mouth 2 (two) times daily with a meal.  07/25/16   [provider]  metoprolol succinate (TOPROL-XL) 25 MG 24 hr tablet Take 25 mg by mouth daily.    [provider]  Multiple Vitamin (MULTIVITAMIN WITH MINERALS) TABS tablet Take 1 tablet by mouth every evening.    [provider]  nortriptyline (PAMELOR) 75 MG capsule Take 75 mg by mouth at bedtime.    [provider]  olmesartan (BENICAR) 20 MG tablet Take 20 mg by mouth daily.    [provider]  oxyCODONE (OXY IR/ROXICODONE) 5 MG immediate release tablet Take 1 pills every 6 hrs as needed for severe pain after surgery 07/19/22   Vernetta Honey, PA-C  pantoprazole (PROTONIX) 40 MG tablet Take 40 mg by mouth at bedtime.    [provider]  traZODone (DESYREL) 50 MG tablet Take 100 mg by mouth at bedtime. 07/06/19   [provider]  UNABLE TO FIND Take 25 mg by mouth in the morning and at bedtime. CBD GUMMY    [provider]  vitamin C (ASCORBIC ACID) 500 MG tablet Take 1,000 mg by mouth every evening.    [provider]      Allergies    Hydromorphone hcl, Hydrocodone-acetaminophen, Hydromorphone, Lotrel [amlodipine besy-benazepril hcl], Oxycontin [oxycodone hcl], Saccharin, Tape, Adhesive [tape], Amlodipine besy-benazepril hcl, and Saccharin    Review of Systems   Review of Systems  Physical Exam Updated Vital Signs BP 106/82   Pulse (!) 107   Temp 97.6 F (36.4 C) (Oral)   Resp 19   Ht 5\' 2"  (1.575 m)   Wt 88.5 kg   SpO2 96%   BMI 35.67 kg/m  Physical Exam Constitutional: Alert and oriented.  Chronically ill-appearing but no acute distress Eyes: Conjunctivae are normal. ENT      Head: Normocephalic and  atraumatic. Cardiovascular: Tachycardic, regular rhythm, palpable DP PT pulses of left lower extremity. Respiratory: Normal respiratory effort.  No chest wall deformity or crepitus.  O2 sat 95 on RA Gastrointestinal: Soft and nontender.  Musculoskeletal:       Right lower leg: Right AKA to hip      Left lower leg: Tenderness to palpation of left knee and pain with flexion, held in extension with a moderate-sized effusion.  Negative anterior posterior drawer sign.  Small abrasions overlying knee present.  No erythema, warmth or streaking.  No tenderness distally.  Sensation intact distally.  Full range of motion of left hip and left ankle and foot intact.  Palpable pulses distally, distal  leg cool and dry. Neurologic: Normal speech and language. No gross focal neurologic deficits are appreciated. Skin: Skin is warm, dry and intact. No rash noted. Psychiatric: Mood and affect are normal. Speech and behavior are normal.  ED Results / Procedures / Treatments   Labs (all labs ordered are listed, but only abnormal results are displayed) Labs Reviewed  BASIC METABOLIC PANEL - Abnormal; Notable for the following components:      Result Value   Sodium 134 (*)    Glucose, Bld 167 (*)    All other components within normal limits  CBC WITH DIFFERENTIAL/PLATELET - Abnormal; Notable for the following components:   RDW 16.3 (*)    All other components within normal limits    EKG None  Radiology CT Knee Left Wo Contrast  Result Date: 01/01/2023 CLINICAL DATA:  r/o occult fx or tibial plateau EXAM: CT OF THE LEFT KNEE WITHOUT CONTRAST TECHNIQUE: Multidetector CT imaging of the left knee was performed according to the standard protocol. Multiplanar CT image reconstructions were also generated. RADIATION DOSE REDUCTION: This exam was performed according to the departmental dose-optimization program which includes automated exposure control, adjustment of the mA and/or kV according to patient size and/or  use of iterative reconstruction technique. COMPARISON:  X-ray left knee 12/31/2022 FINDINGS: Bones/Joint/Cartilage Status post total knee arthroplasty. No CT evidence of surgical hardware complication. Limited evaluation due to streak artifact originating from the surgical hardware. No evidence of fracture or dislocation. Small joint effusion. No evidence of severe arthropathy. No aggressive appearing focal bone abnormality. Ligaments Suboptimally assessed by CT. Muscles and Tendons Atrophy of the musculature.  Otherwise grossly unremarkable. Soft tissues Unremarkable. IMPRESSION: 1. Status post total knee arthroplasty. No CT evidence of surgical hardware complication. 2. Small joint effusion. 3. Limited evaluation due to streak artifact originating from the surgical hardware. Electronically Signed   By: Tish Frederickson M.D.   On: 01/01/2023 03:29   DG HIP UNILAT W OR W/O PELVIS 2-3 VIEWS LEFT  Result Date: 12/31/2022 CLINICAL DATA:  Recent fall with left hip pain, initial encounter EXAM: DG HIP (WITH OR WITHOUT PELVIS) 2-3V LEFT COMPARISON:  None Available. FINDINGS: Pelvic ring is intact. Considerable postoperative changes are noted in the pelvis. Severe degenerative changes of left hip joint are noted with loss of joint space superiorly with subchondral sclerosis and cyst formation as well as remodeling of the femoral head. No fracture is seen. No soft tissue abnormality is noted. IMPRESSION: Degenerative changes of the left hip joint with remodeling of the left femoral head. No acute fracture is seen. Electronically Signed   By: Alcide Clever M.D.   On: 12/31/2022 00:25   DG Knee Complete 4 Views Left  Result Date: 12/31/2022 CLINICAL DATA:  Status post fall. EXAM: LEFT KNEE - COMPLETE 4+ VIEW COMPARISON:  None Available. FINDINGS: A left knee replacement is seen without evidence of surrounding lucency to suggest the presence of hardware loosening or infection. No evidence of an acute fracture or  dislocation. A chronic deformity is seen along the dorsal aspect of the left patella. A small to moderate sized joint effusion is noted. IMPRESSION: 1. Intact left knee replacement without evidence of hardware loosening or infection. 2. Small to moderate-sized left knee effusion. Electronically Signed   By: Aram Candela M.D.   On: 12/31/2022 00:24    Procedures Procedures    Medications Ordered in ED Medications  albuterol (VENTOLIN HFA) 108 (90 Base) MCG/ACT inhaler 2 puff (has no administration in time range)  amLODipine (NORVASC) tablet 2.5 mg (has no administration in time range)  atorvastatin (LIPITOR) tablet 20 mg (has no administration in time range)  buPROPion (WELLBUTRIN XL) 24 hr tablet 300 mg (has no administration in time range)  celecoxib (CELEBREX) capsule 100 mg (has no administration in time range)  Vitamin D3 TABS 5,000 Units (has no administration in time range)  diphenhydrAMINE (BENADRYL) capsule 25 mg (has no administration in time range)  docusate sodium (COLACE) capsule 250 mg (has no administration in time range)  famotidine (PEPCID) tablet 20 mg (has no administration in time range)  insulin lispro (HUMALOG) KwikPen 5-7 Units (has no administration in time range)  ipratropium (ATROVENT) nebulizer solution 0.5 mg (has no administration in time range)  lamoTRIgine (LAMICTAL) tablet 150 mg (has no administration in time range)  insulin detemir (LEVEMIR) injection 17 Units (has no administration in time range)  levothyroxine (SYNTHROID) tablet 50 mcg (has no administration in time range)  melatonin tablet 5 mg (has no administration in time range)  metFORMIN (GLUCOPHAGE-XR) 24 hr tablet 500 mg (has no administration in time range)  metoprolol succinate (TOPROL-XL) 24 hr tablet 25 mg (has no administration in time range)  multivitamin with minerals tablet 1 tablet (has no administration in time range)  nortriptyline (PAMELOR) capsule 75 mg (has no administration in  time range)  irbesartan (AVAPRO) tablet 150 mg (has no administration in time range)  oxyCODONE (Oxy IR/ROXICODONE) immediate release tablet 5 mg (has no administration in time range)  pantoprazole (PROTONIX) EC tablet 40 mg (has no administration in time range)  acetaminophen (TYLENOL) tablet 650 mg (has no administration in time range)  insulin aspart (novoLOG) injection 0-15 Units (has no administration in time range)  traMADol (ULTRAM) tablet 50 mg (50 mg Oral Given 01/01/23 0123)  acetaminophen (TYLENOL) tablet 1,000 mg (1,000 mg Oral Given 01/01/23 0123)    ED Course/ Medical Decision Making/ A&P Clinical Course as of 01/01/23 0346  Tue Jan 01, 2023  0246 Basic labs reviewed by me generally unremarkable and nonactionable.  Normal white blood cell count 9.4.  No left shift present.  Normal hemoglobin 13.5.  Mild hyponatremia 134.  Creatinine 0.51.  Glucose 167.  No anion gap, normal bicarbonate. No DKA. [VB]    Clinical Course User Index [VB] Mardene Sayer, MD   {                            Medical Decision Making  Shirley Mcdonald is a 72 y.o. female.  With PMH of DM, hypothyroidism, HTN, right AKA, left TKR who presents with ongoing left knee pain and effusion and difficulty performing ADLs since fall yesterday.    Patient had x-rays of left hip and left knee performed yesterday negative for acute traumatic injury.  Obtained CT today of left knee which showed no acute traumatic injury other than small joint effusion.  Patient has traumatic joint effusion.  Her presentation is not concerning or consistent with septic joint.  Unlikely ACL tear as there is no joint instability.  She likely has small tendinous or ligamentous injury.  She is neurovascularly intact, no concern for ischemic limb.  She is currently in knee immobilizer.  Pain controlled with tramadol.  Labs reviewed by me unremarkable.Normal white blood cell count 9.4.  No left shift present.  Normal hemoglobin 13.5.   Mild hyponatremia 134.  Creatinine 0.51.  Glucose 167.  No anion gap, normal bicarbonate. No DKA. [VB]  She  has no indication for medical admission at this time.  She is requesting assistance with ADLs and SNF placement.  Reordering patient's home meds, requesting PT and OT evaluation as well as TOC for help with placement.  Amount and/or Complexity of Data Reviewed Labs: ordered. Radiology: ordered.  Risk OTC drugs. Prescription drug management.      Final Clinical Impression(s) / ED Diagnoses Final diagnoses:  Fall, initial encounter  Effusion of left knee    Rx / DC Orders ED Discharge Orders     None         Mardene Sayer, MD 01/01/23 [phone]

## 2023-01-01 NOTE — Evaluation (Signed)
Occupational Therapy Evaluation Patient Details Name: Shirley Mcdonald MRN: [id number] DOB: [birth year] Today's Date: 01/01/2023   History of Present Illness Patient is a 72 year old female who presented on 5/13 after a fall out of her care that resulted in pain in L knee. Patient's imaging revealed small joint effusion but was otherwise unremarkable. PMH: L TKA, DM, HTN, R AKA, hypothyroidism.   Clinical Impression   Patient is a 72 year old female who was admitted for above. Patient was living at home with independence in transfers and ADLs at w/c level. Currently, patient is unable to tolerate attempt standing  with increased pain in ED. Patient was noted to need increased A for bed mobility, and dressing tasks compared to baseline. Patient would continue to benefit from skilled OT services at this time while admitted and after d/c to address noted deficits in order to improve overall safety and independence in ADLs.       Recommendations for follow up therapy are one component of a multi-disciplinary discharge planning process, led by the attending physician.  Recommendations may be updated based on patient status, additional functional criteria and insurance authorization.   Assistance Recommended at Discharge Frequent or constant Supervision/Assistance  Patient can return home with the following A lot of help with bathing/dressing/bathroom;Assistance with cooking/housework;Direct supervision/assist for medications management;Assist for transportation;Help with stairs or ramp for entrance;Direct supervision/assist for financial management;Two people to help with walking and/or transfers    Functional Status Assessment  Patient has had a recent decline in their functional status and demonstrates the ability to make significant improvements in function in a reasonable and predictable amount of time.  Equipment Recommendations  None recommended by OT       Precautions / Restrictions  Precautions Precautions: Fall Precaution Comments: R AKA Required Braces or Orthoses: Knee Immobilizer - Left Restrictions Other Position/Activity Restrictions: no specific order for KI, per pt. , placed at  ortho visit and was told" let the Knee rest."      Mobility Bed Mobility Overal bed mobility: Needs Assistance Bed Mobility: Rolling, Supine to Sit, Sit to Supine Rolling: Min assist   Supine to sit: Min assist, HOB elevated Sit to supine: Min assist   General bed mobility comments: use of bed rails for rolling  and sitting          Balance Overall balance assessment: Needs assistance Sitting-balance support: Feet unsupported Sitting balance-Leahy Scale: Good Sitting balance - Comments: able to shift /lean to each side       ADL either performed or assessed with clinical judgement   ADL Overall ADL's : Needs assistance/impaired Eating/Feeding: Modified independent   Grooming: Modified independent   Upper Body Bathing: Sitting;Minimal assistance   Lower Body Bathing: Sitting/lateral leans;Sit to/from stand;Total assistance   Upper Body Dressing : Sitting;Minimal assistance Upper Body Dressing Details (indicate cue type and reason): to doff long sleeved gown and don hosptial gown Lower Body Dressing: Total assistance;Maximal assistance;Sitting/lateral leans;Sit to/from Market researcher Details (indicate cue type and reason): unable to test in ED safety Toileting- Clothing Manipulation and Hygiene: Total assistance;Bed level               Vision Patient Visual Report: No change from baseline              Pertinent Vitals/Pain Pain Assessment Pain Assessment: 0-10 Pain Score: 4  Pain Location: left knee Pain Descriptors / Indicators: Discomfort Pain Intervention(s): Monitored during session     Hand Dominance  Left   Extremity/Trunk Assessment Upper Extremity Assessment Upper Extremity Assessment: Overall WFL for tasks assessed    Lower Extremity Assessment Lower Extremity Assessment: Defer to PT evaluation RLE Deficits / Details: high AKA LLE Deficits / Details: removed KI, Knee flexion to ~60*, able to  extend knee, uses hand to lift leg.   Cervical / Trunk Assessment Cervical / Trunk Assessment: Normal   Communication Communication Communication: No difficulties   Cognition Arousal/Alertness: Awake/alert Behavior During Therapy: WFL for tasks assessed/performed Overall Cognitive Status: Within Functional Limits for tasks assessed                                                  Home Living Family/patient expects to be discharged to:: Private residence Living Arrangements: Alone   Type of Home: Independent living facility Home Access: Level entry     Home Layout: One level     Bathroom Shower/Tub: Chief Strategy Officer: Handicapped height     Home Equipment: Wheelchair - power   Additional Comments: has two cats at home, siding board      Prior Functioning/Environment Prior Level of Function : Independent/Modified Independent             Mobility Comments: She performs stand-pivot transfers into and out of her chair power wheelchair, which she uses for mobility inside her home. sometimes uses slideboard "cause my RLE is non existant" ADLs Comments: She was modified independent to independent with ADLs. Meals are provided by the facility and the staff also assists with cleaning 1x per week.        OT Problem List: Decreased activity tolerance;Impaired balance (sitting and/or standing);Decreased coordination;Decreased safety awareness;Decreased knowledge of precautions;Decreased knowledge of use of DME or AE      OT Treatment/Interventions: Self-care/ADL training;Energy conservation;Therapeutic exercise;DME and/or AE instruction;Therapeutic activities;Patient/family education;Balance training    OT Goals(Current goals can be found in the care plan  section) Acute Rehab OT Goals Patient Stated Goal: to get better and stop falling OT Goal Formulation: With patient Time For Goal Achievement: 01/15/23 Potential to Achieve Goals: Fair  OT Frequency: Min 1X/week    Co-evaluation   Reason for Co-Treatment: For patient/therapist safety PT goals addressed during session: Mobility/safety with mobility OT goals addressed during session: ADL's and self-care      AM-PAC OT "6 Clicks" Daily Activity     Outcome Measure Help from another person eating meals?: None Help from another person taking care of personal grooming?: A Little Help from another person toileting, which includes using toliet, bedpan, or urinal?: Total Help from another person bathing (including washing, rinsing, drying)?: Total Help from another person to put on and taking off regular upper body clothing?: A Little Help from another person to put on and taking off regular lower body clothing?: A Lot 6 Click Score: 14   End of Session Equipment Utilized During Treatment: Left knee immobilizer Nurse Communication: Mobility status  Activity Tolerance: Patient tolerated treatment well Patient left: in bed;with call bell/phone within reach (in ED)  OT Visit Diagnosis: Unsteadiness on feet (R26.81);Other abnormalities of gait and mobility (R26.89)                Time: 0981-1914 OT Time Calculation (min): 25 min Charges:  OT General Charges $OT Visit: 1 Visit OT Evaluation $OT Eval Low Complexity: 1 Low   OTR/L,  MS Acute Rehabilitation Department Office# [phone]   Selinda Flavin 01/01/2023, 10:12 AM

## 2023-01-01 NOTE — Discharge Instructions (Addendum)
Your aorta is mildly enlarged and will need to be followed up with repeat imaging every year.  You should follow up with your cardiologist this week as scheduled.   If you develop fever, new or worsening chest pain, shortness of breath, or any other new/concerning symptoms and return to the ER or call 911.

## 2023-01-01 NOTE — ED Notes (Signed)
Patient called out and reports chest pain all across her chest for hours. States she tried to pass it off as being sore from getting a "bear hug" from family member within the last day or so. States pain keeps getting worse and hard to breathe. EKG performed and notified Branham MD. Orders received.

## 2023-03-05 NOTE — Progress Notes (Signed)
 Sent message, via epic in basket, requesting orders in epic from surgeon.  

## 2023-03-07 NOTE — Progress Notes (Addendum)
COVID Vaccine received:  []  No [x]  Yes Date of any COVID positive Test in last 90 days: No PCP - Antony Haste MD Cardiologist - Dr. Chales Abrahams @ [facility] in [facility]  Chest x-ray - 01/01/23 Epic EKG -  01/07/23 Epic Stress Test -  ECHO -  Cardiac Cath - 02/05/23 [facility]  Cardiac clearance 02/07/23 Teresita Ruoff PA-C [facility] [place]  Bowel Prep - [x]  No  []   Yes ______  Pacemaker / ICD device [x]  No []  Yes   Spinal Cord Stimulator:[x]  No []  Yes       History of Sleep Apnea? []  No [x]  Yes   CPAP used?- []  No [x]  Yes    Does the patient monitor blood sugar?          [x]  No []  Yes  []  N/A  Patient has: []  NO Hx DM   []  Pre-DM                 []  DM1  [x]   DM2 Does patient have a [employer] or Dexacom?   [x]  Yes   Fasting Blood Sugar Ranges- 110-130 Checks Blood Sugar ___10__ times a day  GLP1 agonist / usual dose - no GLP1 instructions:  SGLT-2 inhibitors / usual dose - no SGLT-2 instructions:   Blood Thinner / Instructions:no Aspirin Instructions:stopped ASA June 15.  Comments:   Activity level: Patient is able to climb a flight of stairs without difficulty; [x]  No CP  [x]  No SOB Pt. In a wheelchair    Patient can  perform ADLs without assistance.   Anesthesia review: OSA,DM2, HTN, Recent L heart cath., Asthma  Patient denies shortness of breath, fever, cough and chest pain at PAT appointment.  Patient verbalized understanding and agreement to the Pre-Surgical Instructions that were given to them at this PAT appointment. Patient was also educated of the need to review these PAT instructions again prior to his/her surgery.I reviewed the appropriate phone numbers to call if they have any and questions or concerns.

## 2023-03-08 ENCOUNTER — Ambulatory Visit: Payer: Self-pay | Admitting: Emergency Medicine

## 2023-03-08 DIAGNOSIS — G8929 Other chronic pain: Secondary | ICD-10-CM

## 2023-03-08 NOTE — H&P (View-Only) (Signed)
TOTAL HIP ADMISSION H&P  Patient is admitted for left total hip arthroplasty.  Subjective:  Chief Complaint: left hip pain  HPI: Shirley Mcdonald, 72 y.o. female, has a history of pain and functional disability in the left hip(s) due to arthritis and patient has failed non-surgical conservative treatments for greater than 12 weeks to include NSAID's and/or analgesics, corticosteriod injections, use of assistive devices, and activity modification.  Onset of symptoms was gradual starting  30  years ago with gradually worsening course since that time.The patient noted no past surgery on the left hip(s).  Patient currently rates pain in the left hip at 10 out of 10 with activity. Patient has night pain, worsening of pain with activity and weight bearing, pain that interfers with activities of daily living, and pain with passive range of motion. Patient has evidence of periarticular osteophytes and joint space narrowing by imaging studies. This condition presents safety issues increasing the risk of falls.  There is no current active infection.  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   Obstructive sleep apnea 02/25/2020   Closed fracture of left proximal humerus 02/11/2019   Past Medical History:  Diagnosis Date   Alpha (0) thalassemia (HCC)    Minor   Alpha thalassemia minor    Arthritis    Asthma    Complication of anesthesia    Combative   Depression    Diabetes mellitus without complication (HCC)    Diabetes mellitus without complication (HCC)    type 2    Dysrhythmia    Familial Mediterranean fever (HCC)    Headache    History of hiatal hernia    Hypertension    Hypothyroidism    Pneumonia    PONV (postoperative nausea and vomiting) 1979   Sleep apnea    Sleep apnea    No CPOP    Past Surgical History:  Procedure Laterality Date   APPENDECTOMY     ARTHROSCOPY KNEE W/ DRILLING     CESAREAN SECTION     x2    CESAREAN SECTION  1914,7829   CHOLECYSTECTOMY     CHOLECYSTECTOMY  1975   CYST EXCISION Left    arm   EXPLORATORY LAPAROTOMY  1985   HERNIA REPAIR     x3   HERNIA REPAIR     3 hernias   KNEE SURGERY Left 1900   LAPAROTOMY  1985   LEG AMPUTATION     AKA   LEG AMPUTATION Right 2012   REPLACEMENT TOTAL KNEE Bilateral 05/2004   REVERSE SHOULDER ARTHROPLASTY Left 02/11/2019   Procedure: REVERSE SHOULDER ARTHROPLASTY;  Surgeon: Bjorn Pippin, MD;  Location: [facility];  Service: Orthopedics;  Laterality: Left;   REVERSE SHOULDER ARTHROPLASTY Right 07/18/2022   Procedure: REVERSE SHOULDER ARTHROPLASTY;  Surgeon: Bjorn Pippin, MD;  Location: [facility];  Service: Orthopedics;  Laterality: Right;   THIGH FASCIOTOMY Right    TONSILLECTOMY     TOTAL SHOULDER ARTHROPLASTY Right 02/11/2019   ULNAR NERVE TRANSPOSITION Left 08/12/2019   Procedure: LEFT ELBOW ULNAR NERVE DISCOMPRESSION AND TRANSPOSITION;  Surgeon: Bjorn Pippin, MD;  Location: [facility];  Service: Orthopedics;  Laterality: Left;    Current Outpatient Medications  Medication Sig Dispense Refill Last Dose   acetaminophen (TYLENOL) 500 MG tablet Take 500 mg by mouth every 6 (six) hours as needed for moderate pain.      amLODipine (NORVASC) 2.5 MG tablet Take 2.5 mg by mouth  daily.      atorvastatin (LIPITOR) 20 MG tablet Take 20 mg by mouth every evening.      B Complex Vitamins (VITAMIN B COMPLEX PO) Take 1 tablet by mouth at bedtime.      benzonatate (TESSALON) 100 MG capsule Take 1 capsule (100 mg total) by mouth every 8 (eight) hours. (Patient not taking: Reported on 01/01/2023) 21 capsule 0    buPROPion (WELLBUTRIN XL) 300 MG 24 hr tablet Take 300 mg by mouth daily.      butalbital-acetaminophen-caffeine (FIORICET) 50-325-40 MG tablet Take 1 tablet by mouth every 6 (six) hours as needed for migraine.      Calcium Carbonate Antacid (ALKA-SELTZER ANTACID PO) Take 1 tablet by mouth daily as needed (gummie.  chew one tablet if needed for  GERD).      CALCIUM-VITAMIN D PO Take 1 tablet by mouth in the morning and at bedtime.      celecoxib (CELEBREX) 100 MG capsule Take 100 mg by mouth 2 (two) times daily.      cetirizine (ZYRTEC) 10 MG tablet Take 10 mg by mouth daily.      Cholecalciferol (VITAMIN D3) 125 MCG (5000 UT) TABS Take 5,000 Units by mouth every evening.      colchicine 0.6 MG tablet Take 0.6 mg by mouth 2 (two) times daily.      docusate sodium (COLACE) 250 MG capsule Take 250 mg by mouth at bedtime.      famotidine (PEPCID) 20 MG tablet Take 20 mg by mouth 2 (two) times daily.      insulin lispro (HUMALOG) 100 UNIT/ML KwikPen Inject 2-6 Units into the skin 3 (three) times daily with meals. Sliding scale 150-200=2 units, 201-250=4 units, 251-300=6 units      ipratropium (ATROVENT) 0.02 % nebulizer solution Take 2.5 mLs (0.5 mg total) by nebulization 4 (four) times daily. (Patient not taking: Reported on 01/01/2023) 75 mL 0    lactobacillus acidophilus (BACID) TABS tablet Take 1 tablet by mouth daily.      lamoTRIgine (LAMICTAL) 200 MG tablet Take 200 mg by mouth every evening.      LANTUS SOLOSTAR 100 UNIT/ML Solostar Pen Inject 17 Units into the skin at bedtime.      levothyroxine (SYNTHROID) 50 MCG tablet Take 50 mcg by mouth daily before breakfast.      LORazepam (ATIVAN) 0.5 MG tablet Take 0.5 mg by mouth daily as needed for anxiety.      melatonin 5 MG TABS Take 5 mg by mouth at bedtime.      metFORMIN (GLUCOPHAGE-XR) 500 MG 24 hr tablet Take 500 mg by mouth 2 (two) times daily with a meal.      metoprolol succinate (TOPROL-XL) 25 MG 24 hr tablet Take 25 mg by mouth daily.      Multiple Vitamin (MULTIVITAMIN WITH MINERALS) TABS tablet Take 1 tablet by mouth every evening.      nortriptyline (PAMELOR) 75 MG capsule Take 75 mg by mouth at bedtime.      olmesartan (BENICAR) 20 MG tablet Take 20 mg by mouth daily.      oxyCODONE (OXY IR/ROXICODONE) 5 MG immediate release tablet Take 1 pills every 6 hrs as needed for  severe pain after surgery (Patient not taking: Reported on 01/01/2023) 30 tablet 0    pantoprazole (PROTONIX) 40 MG tablet Take 40 mg by mouth at bedtime.      traZODone (DESYREL) 50 MG tablet Take 100 mg by mouth at bedtime.  vitamin C (ASCORBIC ACID) 500 MG tablet Take 1,000 mg by mouth every evening.      No current facility-administered medications for this visit.   Allergies  Allergen Reactions   Hydromorphone Hcl Rash    Pt states she did not have a rash on this medication   Hydrocodone-Acetaminophen Itching    Makes face itch    Hydromorphone     Sleepy   Lotrel [Amlodipine Besy-Benazepril Hcl] Cough   Oxycontin [Oxycodone Hcl] Itching   Saccharin     Causes stomach pain    Tape     Blister    Adhesive [Tape] Rash    Causes blistering   Amlodipine Besy-Benazepril Hcl Cough   Saccharin Other (See Comments)    Social History   Tobacco Use   Smoking status: Former    Current packs/day: 0.00    Average packs/day: 1 pack/day for 40.0 years (40.0 ttl pk-yrs)    Types: Cigarettes    Start date: 4    Quit date: 2015    Years since quitting: 9.5   Smokeless tobacco: Never   Tobacco comments:    2015  Substance Use Topics   Alcohol use: Not Currently    No family history on file.   Review of Systems  Musculoskeletal:  Positive for arthralgias.  All other systems reviewed and are negative.   Objective:  Physical Exam Constitutional:      General: She is not in acute distress.    Appearance: Normal appearance. She is not ill-appearing.  HENT:     Head: Normocephalic and atraumatic.     Right Ear: External ear normal.     Left Ear: External ear normal.     Nose: Nose normal.     Mouth/Throat:     Mouth: Mucous membranes are moist.     Pharynx: Oropharynx is clear.  Eyes:     Extraocular Movements: Extraocular movements intact.     Conjunctiva/sclera: Conjunctivae normal.  Cardiovascular:     Rate and Rhythm: Normal rate and regular rhythm.      Pulses: Normal pulses.     Heart sounds: Normal heart sounds.  Pulmonary:     Effort: Pulmonary effort is normal.     Breath sounds: Normal breath sounds.  Abdominal:     General: Bowel sounds are normal.     Palpations: Abdomen is soft.     Tenderness: There is no abdominal tenderness.  Musculoskeletal:        General: Tenderness present.     Cervical back: Normal range of motion and neck supple.     Comments: TTP over groin, lateral aspect, greater trochanter.  Mild IT band tenderness.  No significant swelling.  No overlying lesions of area of chief complaint.  Decreased strength and ROM due to elicited pain.  Dorsiflexion and plantarflexion intact.  RLE surgically absent starting just proximal to knee.  LLE appears grossly neurovascularly intact.  Gait deferred.   Skin:    General: Skin is warm and dry.  Neurological:     Mental Status: She is alert and oriented to person, place, and time. Mental status is at baseline.  Psychiatric:        Mood and Affect: Mood normal.        Behavior: Behavior normal.     Vital signs in last 24 hours: @VSRANGES @  Labs:   Estimated body mass index is 35.67 kg/m as calculated from the following:   Height as of 01/01/23: 5\' 2"  (1.575 m).  Weight as of 01/01/23: 88.5 kg.   Imaging Review Plain radiographs demonstrate severe degenerative joint disease of the left hip(s). The bone quality appears to be fair for age and reported activity level.      Assessment/Plan:  End stage arthritis, left hip(s)  The patient history, physical examination, clinical judgement of the provider and imaging studies are consistent with end stage degenerative joint disease of the left hip(s) and total hip arthroplasty is deemed medically necessary. The treatment options including medical management, injection therapy, arthroscopy and arthroplasty were discussed at length. The risks and benefits of total hip arthroplasty were presented and reviewed. The risks  due to aseptic loosening, infection, stiffness, dislocation/subluxation,  thromboembolic complications and other imponderables were discussed.  The patient acknowledged the explanation, agreed to proceed with the plan and consent was signed. Patient is being admitted for inpatient treatment for surgery, pain control, PT, OT, prophylactic antibiotics, VTE prophylaxis, progressive ambulation and ADL's and discharge planning. Anticipate stay for observation.  The patient is planning to be discharged home with HHPT and HHOT.   Anticipated LOS equal to or greater than 2 midnights due to - Age 19 and older with one or more of the following:  - Obesity  - Expected need for hospital services (PT, OT, Nursing) required for safe  discharge  - Anticipated need for postoperative skilled nursing care or inpatient rehab  - Active co-morbidities: Diabetes, OSA, HLD, HTN, hypothyroidism, alpha thalassemia minor, familial mediterranean fever, GERD OR   - Unanticipated findings during/Post Surgery: None  - Patient is a high risk of re-admission due to: None

## 2023-03-08 NOTE — H&P (Signed)
TOTAL HIP ADMISSION H&P  Patient is admitted for left total hip arthroplasty.  Subjective:  Chief Complaint: left hip pain  HPI: Shirley Mcdonald, 72 y.o. female, has a history of pain and functional disability in the left hip(s) due to arthritis and patient has failed non-surgical conservative treatments for greater than 12 weeks to include NSAID's and/or analgesics, corticosteriod injections, use of assistive devices, and activity modification.  Onset of symptoms was gradual starting  30  years ago with gradually worsening course since that time.The patient noted no past surgery on the left hip(s).  Patient currently rates pain in the left hip at 10 out of 10 with activity. Patient has night pain, worsening of pain with activity and weight bearing, pain that interfers with activities of daily living, and pain with passive range of motion. Patient has evidence of periarticular osteophytes and joint space narrowing by imaging studies. This condition presents safety issues increasing the risk of falls.  There is no current active infection.  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   Obstructive sleep apnea 02/25/2020   Closed fracture of left proximal humerus 02/11/2019   Past Medical History:  Diagnosis Date   Alpha (0) thalassemia (HCC)    Minor   Alpha thalassemia minor    Arthritis    Asthma    Complication of anesthesia    Combative   Depression    Diabetes mellitus without complication (HCC)    Diabetes mellitus without complication (HCC)    type 2    Dysrhythmia    Familial Mediterranean fever (HCC)    Headache    History of hiatal hernia    Hypertension    Hypothyroidism    Pneumonia    PONV (postoperative nausea and vomiting) 1979   Sleep apnea    Sleep apnea    No CPOP    Past Surgical History:  Procedure Laterality Date   APPENDECTOMY     ARTHROSCOPY KNEE W/ DRILLING     CESAREAN SECTION     x2    CESAREAN SECTION  1914,7829   CHOLECYSTECTOMY     CHOLECYSTECTOMY  1975   CYST EXCISION Left    arm   EXPLORATORY LAPAROTOMY  1985   HERNIA REPAIR     x3   HERNIA REPAIR     3 hernias   KNEE SURGERY Left 1900   LAPAROTOMY  1985   LEG AMPUTATION     AKA   LEG AMPUTATION Right 2012   REPLACEMENT TOTAL KNEE Bilateral 05/2004   REVERSE SHOULDER ARTHROPLASTY Left 02/11/2019   Procedure: REVERSE SHOULDER ARTHROPLASTY;  Surgeon: Bjorn Pippin, MD;  Location: [facility];  Service: Orthopedics;  Laterality: Left;   REVERSE SHOULDER ARTHROPLASTY Right 07/18/2022   Procedure: REVERSE SHOULDER ARTHROPLASTY;  Surgeon: Bjorn Pippin, MD;  Location: [facility];  Service: Orthopedics;  Laterality: Right;   THIGH FASCIOTOMY Right    TONSILLECTOMY     TOTAL SHOULDER ARTHROPLASTY Right 02/11/2019   ULNAR NERVE TRANSPOSITION Left 08/12/2019   Procedure: LEFT ELBOW ULNAR NERVE DISCOMPRESSION AND TRANSPOSITION;  Surgeon: Bjorn Pippin, MD;  Location: [facility];  Service: Orthopedics;  Laterality: Left;    Current Outpatient Medications  Medication Sig Dispense Refill Last Dose   acetaminophen (TYLENOL) 500 MG tablet Take 500 mg by mouth every 6 (six) hours as needed for moderate pain.      amLODipine (NORVASC) 2.5 MG tablet Take 2.5 mg by mouth  daily.      atorvastatin (LIPITOR) 20 MG tablet Take 20 mg by mouth every evening.      B Complex Vitamins (VITAMIN B COMPLEX PO) Take 1 tablet by mouth at bedtime.      benzonatate (TESSALON) 100 MG capsule Take 1 capsule (100 mg total) by mouth every 8 (eight) hours. (Patient not taking: Reported on 01/01/2023) 21 capsule 0    buPROPion (WELLBUTRIN XL) 300 MG 24 hr tablet Take 300 mg by mouth daily.      butalbital-acetaminophen-caffeine (FIORICET) 50-325-40 MG tablet Take 1 tablet by mouth every 6 (six) hours as needed for migraine.      Calcium Carbonate Antacid (ALKA-SELTZER ANTACID PO) Take 1 tablet by mouth daily as needed (gummie.  chew one tablet if needed for  GERD).      CALCIUM-VITAMIN D PO Take 1 tablet by mouth in the morning and at bedtime.      celecoxib (CELEBREX) 100 MG capsule Take 100 mg by mouth 2 (two) times daily.      cetirizine (ZYRTEC) 10 MG tablet Take 10 mg by mouth daily.      Cholecalciferol (VITAMIN D3) 125 MCG (5000 UT) TABS Take 5,000 Units by mouth every evening.      colchicine 0.6 MG tablet Take 0.6 mg by mouth 2 (two) times daily.      docusate sodium (COLACE) 250 MG capsule Take 250 mg by mouth at bedtime.      famotidine (PEPCID) 20 MG tablet Take 20 mg by mouth 2 (two) times daily.      insulin lispro (HUMALOG) 100 UNIT/ML KwikPen Inject 2-6 Units into the skin 3 (three) times daily with meals. Sliding scale 150-200=2 units, 201-250=4 units, 251-300=6 units      ipratropium (ATROVENT) 0.02 % nebulizer solution Take 2.5 mLs (0.5 mg total) by nebulization 4 (four) times daily. (Patient not taking: Reported on 01/01/2023) 75 mL 0    lactobacillus acidophilus (BACID) TABS tablet Take 1 tablet by mouth daily.      lamoTRIgine (LAMICTAL) 200 MG tablet Take 200 mg by mouth every evening.      LANTUS SOLOSTAR 100 UNIT/ML Solostar Pen Inject 17 Units into the skin at bedtime.      levothyroxine (SYNTHROID) 50 MCG tablet Take 50 mcg by mouth daily before breakfast.      LORazepam (ATIVAN) 0.5 MG tablet Take 0.5 mg by mouth daily as needed for anxiety.      melatonin 5 MG TABS Take 5 mg by mouth at bedtime.      metFORMIN (GLUCOPHAGE-XR) 500 MG 24 hr tablet Take 500 mg by mouth 2 (two) times daily with a meal.      metoprolol succinate (TOPROL-XL) 25 MG 24 hr tablet Take 25 mg by mouth daily.      Multiple Vitamin (MULTIVITAMIN WITH MINERALS) TABS tablet Take 1 tablet by mouth every evening.      nortriptyline (PAMELOR) 75 MG capsule Take 75 mg by mouth at bedtime.      olmesartan (BENICAR) 20 MG tablet Take 20 mg by mouth daily.      oxyCODONE (OXY IR/ROXICODONE) 5 MG immediate release tablet Take 1 pills every 6 hrs as needed for  severe pain after surgery (Patient not taking: Reported on 01/01/2023) 30 tablet 0    pantoprazole (PROTONIX) 40 MG tablet Take 40 mg by mouth at bedtime.      traZODone (DESYREL) 50 MG tablet Take 100 mg by mouth at bedtime.  vitamin C (ASCORBIC ACID) 500 MG tablet Take 1,000 mg by mouth every evening.      No current facility-administered medications for this visit.   Allergies  Allergen Reactions   Hydromorphone Hcl Rash    Pt states she did not have a rash on this medication   Hydrocodone-Acetaminophen Itching    Makes face itch    Hydromorphone     Sleepy   Lotrel [Amlodipine Besy-Benazepril Hcl] Cough   Oxycontin [Oxycodone Hcl] Itching   Saccharin     Causes stomach pain    Tape     Blister    Adhesive [Tape] Rash    Causes blistering   Amlodipine Besy-Benazepril Hcl Cough   Saccharin Other (See Comments)    Social History   Tobacco Use   Smoking status: Former    Current packs/day: 0.00    Average packs/day: 1 pack/day for 40.0 years (40.0 ttl pk-yrs)    Types: Cigarettes    Start date: 4    Quit date: 2015    Years since quitting: 9.5   Smokeless tobacco: Never   Tobacco comments:    2015  Substance Use Topics   Alcohol use: Not Currently    No family history on file.   Review of Systems  Musculoskeletal:  Positive for arthralgias.  All other systems reviewed and are negative.   Objective:  Physical Exam Constitutional:      General: She is not in acute distress.    Appearance: Normal appearance. She is not ill-appearing.  HENT:     Head: Normocephalic and atraumatic.     Right Ear: External ear normal.     Left Ear: External ear normal.     Nose: Nose normal.     Mouth/Throat:     Mouth: Mucous membranes are moist.     Pharynx: Oropharynx is clear.  Eyes:     Extraocular Movements: Extraocular movements intact.     Conjunctiva/sclera: Conjunctivae normal.  Cardiovascular:     Rate and Rhythm: Normal rate and regular rhythm.      Pulses: Normal pulses.     Heart sounds: Normal heart sounds.  Pulmonary:     Effort: Pulmonary effort is normal.     Breath sounds: Normal breath sounds.  Abdominal:     General: Bowel sounds are normal.     Palpations: Abdomen is soft.     Tenderness: There is no abdominal tenderness.  Musculoskeletal:        General: Tenderness present.     Cervical back: Normal range of motion and neck supple.     Comments: TTP over groin, lateral aspect, greater trochanter.  Mild IT band tenderness.  No significant swelling.  No overlying lesions of area of chief complaint.  Decreased strength and ROM due to elicited pain.  Dorsiflexion and plantarflexion intact.  RLE surgically absent starting just proximal to knee.  LLE appears grossly neurovascularly intact.  Gait deferred.   Skin:    General: Skin is warm and dry.  Neurological:     Mental Status: She is alert and oriented to person, place, and time. Mental status is at baseline.  Psychiatric:        Mood and Affect: Mood normal.        Behavior: Behavior normal.     Vital signs in last 24 hours: @VSRANGES @  Labs:   Estimated body mass index is 35.67 kg/m as calculated from the following:   Height as of 01/01/23: 5\' 2"  (1.575 m).  Weight as of 01/01/23: 88.5 kg.   Imaging Review Plain radiographs demonstrate severe degenerative joint disease of the left hip(s). The bone quality appears to be fair for age and reported activity level.      Assessment/Plan:  End stage arthritis, left hip(s)  The patient history, physical examination, clinical judgement of the provider and imaging studies are consistent with end stage degenerative joint disease of the left hip(s) and total hip arthroplasty is deemed medically necessary. The treatment options including medical management, injection therapy, arthroscopy and arthroplasty were discussed at length. The risks and benefits of total hip arthroplasty were presented and reviewed. The risks  due to aseptic loosening, infection, stiffness, dislocation/subluxation,  thromboembolic complications and other imponderables were discussed.  The patient acknowledged the explanation, agreed to proceed with the plan and consent was signed. Patient is being admitted for inpatient treatment for surgery, pain control, PT, OT, prophylactic antibiotics, VTE prophylaxis, progressive ambulation and ADL's and discharge planning. Anticipate stay for observation.  The patient is planning to be discharged home with HHPT and HHOT.   Anticipated LOS equal to or greater than 2 midnights due to - Age 19 and older with one or more of the following:  - Obesity  - Expected need for hospital services (PT, OT, Nursing) required for safe  discharge  - Anticipated need for postoperative skilled nursing care or inpatient rehab  - Active co-morbidities: Diabetes, OSA, HLD, HTN, hypothyroidism, alpha thalassemia minor, familial mediterranean fever, GERD OR   - Unanticipated findings during/Post Surgery: None  - Patient is a high risk of re-admission due to: None

## 2023-03-11 NOTE — Patient Instructions (Signed)
SURGICAL WAITING ROOM VISITATION  Patients having surgery or a procedure may have no more than 2 support people in the waiting area - these visitors may rotate.    Children under the age of 57 must have an adult with them who is not the patient.  Due to an increase in RSV and influenza rates and associated hospitalizations, children ages 72 and under may not visit patients in [facility] hospitals.  If the patient needs to stay at the hospital during part of their recovery, the visitor guidelines for inpatient rooms apply. Pre-op nurse will coordinate an appropriate time for 1 support person to accompany patient in pre-op.  This support person may not rotate.    Please refer to the [facility] website for the visitor guidelines for Inpatients (after your surgery is over and you are in a regular room).       Your procedure is scheduled on: 03/25/23   Report to [facility] Main Entrance    Report to admitting at 7:50 AM   Call this number if you have problems the morning of surgery [phone]   Do not eat food :After Midnight.   After Midnight you may have the following liquids until 7:20 AM  DAY OF SURGERY  Water Non-Citrus Juices (without pulp, NO RED-Apple, White grape, White cranberry) Black Coffee (NO MILK/CREAM OR CREAMERS, sugar ok)  Clear Tea (NO MILK/CREAM OR CREAMERS, sugar ok) regular and decaf                             Plain Jell-O (NO RED)                                           Fruit ices (not with fruit pulp, NO RED)                                     Popsicles (NO RED)                                                               Sports drinks like Gatorade (NO RED)                The day of surgery:  Drink ONE (1) Pre-Surgery  G2 at 7:20 AM the morning of surgery. Drink in one sitting. Do not sip.  This drink was given to you during your hospital  pre-op appointment visit. Nothing else to drink after completing the  Pre-Surgery G2.     Oral  Hygiene is also important to reduce your risk of infection.                                    Remember - BRUSH YOUR TEETH THE MORNING OF SURGERY WITH YOUR REGULAR TOOTHPASTE  DENTURES WILL BE REMOVED PRIOR TO SURGERY PLEASE DO NOT APPLY "Poly grip" OR ADHESIVES!!!   Take these medicines the morning of surgery : Tylenol if needed, Amlodipine, Atorvastatin, Bupropion, cetirizine,Colchicine, Famotidine, Levothyroxine, Lamictal,  Metoprolol, Protonix and Lorazepam if needed.   DO NOT TAKE ANY ORAL DIABETIC MEDICATIONS DAY OF YOUR SURGERY . HOLD METFORMIN THE DAY OF SURGERY  Bring CPAP mask and tubing day of surgery.                              You may not have any metal on your body including hair pins, jewelry, and body piercing             Do not wear make-up, lotions, powders, perfumes, or deodorant  Do not wear nail polish including gel and S&S, artificial/acrylic nails, or any other type of covering on natural nails including finger and toenails. If you have artificial nails, gel coating, etc. that needs to be removed by a nail salon please have this removed prior to surgery or surgery may need to be canceled/ delayed if the surgeon/ anesthesia feels like they are unable to be safely monitored.   Do not shave  48 hours prior to surgery.    Do not bring valuables to the hospital. [facility] IS NOT             RESPONSIBLE   FOR VALUABLES.   Contacts, glasses, dentures or bridgework may not be worn into surgery.   Bring small overnight bag day of surgery.   DO NOT BRING YOUR HOME MEDICATIONS TO THE HOSPITAL. PHARMACY WILL DISPENSE MEDICATIONS LISTED ON YOUR MEDICATION LIST TO YOU DURING YOUR ADMISSION IN THE HOSPITAL!    Patients discharged on the day of surgery will not be allowed to drive home.  Someone NEEDS to stay with you for the first 24 hours after anesthesia.   Special Instructions: Bring a copy of your healthcare power of attorney and living will documents the day of surgery  if you haven't scanned them before.              Please read over the following fact sheets you were given: IF YOU HAVE QUESTIONS ABOUT YOUR PRE-OP INSTRUCTIONS PLEASE CALL [phone] Rosey Bath    If you received a COVID test during your pre-op visit  it is requested that you wear a mask when out in public, stay away from anyone that may not be feeling well and notify your surgeon if you develop symptoms. If you test positive for Covid or have been in contact with anyone that has tested positive in the last 10 days please notify you surgeon.      Pre-operative 5 CHG Bath Instructions   You can play a key role in reducing the risk of infection after surgery. Your skin needs to be as free of germs as possible. You can reduce the number of germs on your skin by washing with CHG (chlorhexidine gluconate) soap before surgery. CHG is an antiseptic soap that kills germs and continues to kill germs even after washing.   DO NOT use if you have an allergy to chlorhexidine/CHG or antibacterial soaps. If your skin becomes reddened or irritated, stop using the CHG and notify one of our RNs at [phone].   Please shower with the CHG soap starting 4 days before surgery using the following schedule:     Please keep in mind the following:  DO NOT shave, including legs and underarms, starting the day of your first shower.   You may shave your face at any point before/day of surgery.  Place clean sheets on your bed the day you start using  CHG soap. Use a clean washcloth (not used since being washed) for each shower. DO NOT sleep with pets once you start using the CHG.   CHG Shower Instructions:  If you choose to wash your hair and private area, wash first with your normal shampoo/soap.  After you use shampoo/soap, rinse your hair and body thoroughly to remove shampoo/soap residue.  Turn the water OFF and apply about 3 tablespoons (45 ml) of CHG soap to a CLEAN washcloth.  Apply CHG soap ONLY FROM YOUR  NECK DOWN TO YOUR TOES (washing for 3-5 minutes)  DO NOT use CHG soap on face, private areas, open wounds, or sores.  Pay special attention to the area where your surgery is being performed.  If you are having back surgery, having someone wash your back for you may be helpful. Wait 2 minutes after CHG soap is applied, then you may rinse off the CHG soap.  Pat dry with a clean towel  Put on clean clothes/pajamas   If you choose to wear lotion, please use ONLY the CHG-compatible lotions on the back of this paper.     Additional instructions for the day of surgery: DO NOT APPLY any lotions, deodorants, cologne, or perfumes.   Put on clean/comfortable clothes.  Brush your teeth.  Ask your nurse before applying any prescription medications to the skin.      CHG Compatible Lotions   Aveeno Moisturizing lotion  Cetaphil Moisturizing Cream  Cetaphil Moisturizing Lotion  Clairol Herbal Essence Moisturizing Lotion, Dry Skin  Clairol Herbal Essence Moisturizing Lotion, Extra Dry Skin  Clairol Herbal Essence Moisturizing Lotion, Normal Skin  Curel Age Defying Therapeutic Moisturizing Lotion with Alpha Hydroxy  Curel Extreme Care Body Lotion  Curel Soothing Hands Moisturizing Hand Lotion  Curel Therapeutic Moisturizing Cream, Fragrance-Free  Curel Therapeutic Moisturizing Lotion, Fragrance-Free  Curel Therapeutic Moisturizing Lotion, Original Formula  Eucerin Daily Replenishing Lotion  Eucerin Dry Skin Therapy Plus Alpha Hydroxy Crme  Eucerin Dry Skin Therapy Plus Alpha Hydroxy Lotion  Eucerin Original Crme  Eucerin Original Lotion  Eucerin Plus Crme Eucerin Plus Lotion  Eucerin TriLipid Replenishing Lotion  Keri Anti-Bacterial Hand Lotion  Keri Deep Conditioning Original Lotion Dry Skin Formula Softly Scented  Keri Deep Conditioning Original Lotion, Fragrance Free Sensitive Skin Formula  Keri Lotion Fast Absorbing Fragrance Free Sensitive Skin Formula  Keri Lotion Fast Absorbing  Softly Scented Dry Skin Formula  Keri Original Lotion  Keri Skin Renewal Lotion Keri Silky Smooth Lotion  Keri Silky Smooth Sensitive Skin Lotion  Nivea Body Creamy Conditioning Oil  Nivea Body Extra Enriched Lotion  Nivea Body Original Lotion  Nivea Body Sheer Moisturizing Lotion Nivea Crme  Nivea Skin Firming Lotion  NutraDerm 30 Skin Lotion  NutraDerm Skin Lotion  NutraDerm Therapeutic Skin Cream  NutraDerm Therapeutic Skin Lotion  ProShield Protective Hand Cream  Provon moisturizing lotion How to Manage Your Diabetes Before and After Surgery  Why is it important to control my blood sugar before and after surgery? Improving blood sugar levels before and after surgery helps healing and can limit problems. A way of improving blood sugar control is eating a healthy diet by:  Eating less sugar and carbohydrates  Increasing activity/exercise  Talking with your doctor about reaching your blood sugar goals High blood sugars (greater than 180 mg/dL) can raise your risk of infections and slow your recovery, so you will need to focus on controlling your diabetes during the weeks before surgery. Make sure that the doctor who takes care of  your diabetes knows about your planned surgery including the date and location.  How do I manage my blood sugar before surgery? Check your blood sugar at least 4 times a day, starting 2 days before surgery, to make sure that the level is not too high or low. Check your blood sugar the morning of your surgery when you wake up and every 2 hours until you get to the Short Stay unit. If your blood sugar is less than 70 mg/dL, you will need to treat for low blood sugar: Do not take insulin. Treat a low blood sugar (less than 70 mg/dL) with  cup of clear juice (cranberry or apple), 4 glucose tablets, OR glucose gel. Recheck blood sugar in 15 minutes after treatment (to make sure it is greater than 70 mg/dL). If your blood sugar is not greater than 70 mg/dL on  recheck, call [phone] for further instructions. Report your blood sugar to the short stay nurse when you get to Short Stay.  If you are admitted to the hospital after surgery: Your blood sugar will be checked by the staff and you will probably be given insulin after surgery (instead of oral diabetes medicines) to make sure you have good blood sugar levels. The goal for blood sugar control after surgery is 80-180 mg/dL.   WHAT DO I DO ABOUT MY DIABETES MEDICATION?  Do not take oral diabetes medicines (pills) the morning of surgery. HOLD METFORMIN THE DAY OF SURGERY  THE NIGHT BEFORE SURGERY HOLD LANTUS  PM DOSE OF INSULIN      .  DO NOT TAKE THE FOLLOWING 7 DAYS PRIOR TO SURGERY: Ozempic, Wegovy, Rybelsus (Semaglutide), Byetta (exenatide), Bydureon (exenatide ER), Victoza, Saxenda (liraglutide), or Trulicity (dulaglutide) Mounjaro (Tirzepatide) Adlyxin (Lixisenatide), Polyethylene Glycol Loxenatide.  If your CBG is greater than 220 mg/dL, you may take  of your sliding scale  (correction) dose of insulin.    For patients with insulin pumps: Contact your diabetes doctor for specific instructions before surgery. Decrease basal rates by 20% at midnight the night before your surgery. Note that if your surgery is planned to be longer than 2 hours, your insulin pump will be removed and intravenous (IV) insulin will be started and managed by the nurses and the anesthesiologist. You will be able to restart your insulin pump once you are awake and able to manage it.  Make sure to bring insulin pump supplies to the hospital with you in case the  site needs to be changed.  Patient Signature:  Date:   Nurse Signature:  Date:    WHAT IS A BLOOD TRANSFUSION? Blood Transfusion Information  A transfusion is the replacement of blood or some of its parts. Blood is made up of multiple cells which provide different functions. Red blood cells carry oxygen and are used for blood loss  replacement. White blood cells fight against infection. Platelets control bleeding. Plasma helps clot blood. Other blood products are available for specialized needs, such as hemophilia or other clotting disorders. BEFORE THE TRANSFUSION  Who gives blood for transfusions?  Healthy volunteers who are fully evaluated to make sure their blood is safe. This is blood bank blood. Transfusion therapy is the safest it has ever been in the practice of medicine. Before blood is taken from a donor, a complete history is taken to make sure that person has no history of diseases nor engages in risky social behavior (examples are intravenous drug use or sexual activity with multiple partners). The donor's travel history is  screened to minimize risk of transmitting infections, such as malaria. The donated blood is tested for signs of infectious diseases, such as HIV and hepatitis. The blood is then tested to be sure it is compatible with you in order to minimize the chance of a transfusion reaction. If you or a relative donates blood, this is often done in anticipation of surgery and is not appropriate for emergency situations. It takes many days to process the donated blood. RISKS AND COMPLICATIONS Although transfusion therapy is very safe and saves many lives, the main dangers of transfusion include:  Getting an infectious disease. Developing a transfusion reaction. This is an allergic reaction to something in the blood you were given. Every precaution is taken to prevent this. The decision to have a blood transfusion has been considered carefully by your caregiver before blood is given. Blood is not given unless the benefits outweigh the risks. AFTER THE TRANSFUSION Right after receiving a blood transfusion, you will usually feel much better and more energetic. This is especially true if your red blood cells have gotten low (anemic). The transfusion raises the level of the red blood cells which carry oxygen, and  this usually causes an energy increase. The nurse administering the transfusion will monitor you carefully for complications. HOME CARE INSTRUCTIONS  No special instructions are needed after a transfusion. You may find your energy is better. Speak with your caregiver about any limitations on activity for underlying diseases you may have. SEEK MEDICAL CARE IF:  Your condition is not improving after your transfusion. You develop redness or irritation at the intravenous (IV) site. SEEK IMMEDIATE MEDICAL CARE IF:  Any of the following symptoms occur over the next 12 hours: Shaking chills. You have a temperature by mouth above 102 F (38.9 C), not controlled by medicine. Chest, back, or muscle pain. People around you feel you are not acting correctly or are confused. Shortness of breath or difficulty breathing. Dizziness and fainting. You get a rash or develop hives. You have a decrease in urine output. Your urine turns a dark color or changes to pink, red, or brown. Any of the following symptoms occur over the next 10 days: You have a temperature by mouth above 102 F (38.9 C), not controlled by medicine. Shortness of breath. Weakness after normal activity. The white part of the eye turns yellow (jaundice). You have a decrease in the amount of urine or are urinating less often. Your urine turns a dark color or changes to pink, red, or brown. Marland Kitchen

## 2023-03-12 ENCOUNTER — Encounter (HOSPITAL_COMMUNITY)
Admission: RE | Admit: 2023-03-12 | Discharge: 2023-03-12 | Disposition: A | Discharge status: Home / Self Care | Payer: Medicare Other | Source: Ambulatory Visit | Attending: Orthopedic Surgery | Admitting: Orthopedic Surgery

## 2023-03-12 ENCOUNTER — Other Ambulatory Visit: Payer: Self-pay

## 2023-03-12 ENCOUNTER — Encounter (HOSPITAL_COMMUNITY): Payer: Self-pay

## 2023-03-12 VITALS — BP 126/71 | HR 69 | Temp 98.2°F | Resp 18 | Ht 62.0 in | Wt 184.0 lb

## 2023-03-12 DIAGNOSIS — Z01812 Encounter for preprocedural laboratory examination: Secondary | ICD-10-CM | POA: Insufficient documentation

## 2023-03-12 DIAGNOSIS — M25552 Pain in left hip: Secondary | ICD-10-CM | POA: Insufficient documentation

## 2023-03-12 DIAGNOSIS — Z794 Long term (current) use of insulin: Secondary | ICD-10-CM | POA: Insufficient documentation

## 2023-03-12 DIAGNOSIS — G8929 Other chronic pain: Secondary | ICD-10-CM | POA: Diagnosis not present

## 2023-03-12 DIAGNOSIS — E119 Type 2 diabetes mellitus without complications: Secondary | ICD-10-CM | POA: Insufficient documentation

## 2023-03-12 DIAGNOSIS — Z01818 Encounter for other preprocedural examination: Secondary | ICD-10-CM

## 2023-03-12 HISTORY — DX: Gastro-esophageal reflux disease without esophagitis: K21.9

## 2023-03-12 LAB — COMPREHENSIVE METABOLIC PANEL
ALT: 22 U/L (ref 0–44)
AST: 23 U/L (ref 15–41)
Albumin: 4 g/dL (ref 3.5–5.0)
Alkaline Phosphatase: 41 U/L (ref 38–126)
Anion gap: 9 (ref 5–15)
BUN: 10 mg/dL (ref 8–23)
CO2: 26 mmol/L (ref 22–32)
Calcium: 8.9 mg/dL (ref 8.9–10.3)
Chloride: 98 mmol/L (ref 98–111)
Creatinine, Ser: 0.51 mg/dL (ref 0.44–1.00)
GFR, Estimated: >60 mL/min (ref >60)
Glucose, Bld: 106 mg/dL — ABNORMAL HIGH (ref 70–99)
Potassium: 4.4 mmol/L (ref 3.5–5.1)
Sodium: 133 mmol/L — ABNORMAL LOW (ref 135–145)
Total Bilirubin: 1.1 mg/dL (ref 0.3–1.2)
Total Protein: 6.3 g/dL — ABNORMAL LOW (ref 6.5–8.1)

## 2023-03-12 LAB — HEMOGLOBIN A1C
Hgb A1c MFr Bld: 4.5 % — ABNORMAL LOW (ref 4.8–5.6)
Mean Plasma Glucose: 82.45 mg/dL

## 2023-03-12 LAB — TYPE AND SCREEN
ABO/RH(D): A NEG
Antibody Screen: NEGATIVE

## 2023-03-12 LAB — CBC WITH DIFFERENTIAL/PLATELET
Abs Immature Granulocytes: 0.03 10*3/uL (ref 0.00–0.07)
Basophils Absolute: 0 10*3/uL (ref 0.0–0.1)
Basophils Relative: 1 %
Eosinophils Absolute: 0.3 10*3/uL (ref 0.0–0.5)
Eosinophils Relative: 6 %
HCT: 33.2 % — ABNORMAL LOW (ref 36.0–46.0)
Hemoglobin: 11.3 g/dL — ABNORMAL LOW (ref 12.0–15.0)
Immature Granulocytes: 1 %
Lymphocytes Relative: 13 %
Lymphs Abs: 0.7 10*3/uL (ref 0.7–4.0)
MCH: 28.6 pg (ref 26.0–34.0)
MCHC: 34 g/dL (ref 30.0–36.0)
MCV: 84.1 fL (ref 80.0–100.0)
Monocytes Absolute: 0.4 10*3/uL (ref 0.1–1.0)
Monocytes Relative: 8 %
Neutro Abs: 4.2 10*3/uL (ref 1.7–7.7)
Neutrophils Relative %: 71 %
Platelets: 135 10*3/uL — ABNORMAL LOW (ref 150–400)
RBC: 3.95 MIL/uL (ref 3.87–5.11)
RDW: 15.7 % — ABNORMAL HIGH (ref 11.5–15.5)
WBC: 5.7 10*3/uL (ref 4.0–10.5)
nRBC: 0 % (ref 0.0–0.2)

## 2023-03-12 LAB — SURGICAL PCR SCREEN
MRSA, PCR: NEGATIVE
Staphylococcus aureus: NEGATIVE

## 2023-03-15 NOTE — Anesthesia Preprocedure Evaluation (Addendum)
Anesthesia Evaluation  Patient identified by MRN, date of birth, ID band Patient awake    Reviewed: Allergy & Precautions, NPO status , Patient's Chart, lab work & pertinent test results, reviewed documented beta blocker date and time   History of Anesthesia Complications (+) PONV, Emergence Delirium and history of anesthetic complications  Airway Mallampati: III  TM Distance: >3 FB Neck ROM: Full    Dental  (+) Dental Advisory Given, Missing, Chipped, Poor Dentition, Edentulous Upper   Pulmonary asthma , sleep apnea and Continuous Positive Airway Pressure Ventilation , former smoker   Pulmonary exam normal        Cardiovascular hypertension, Pt. on medications and Pt. on home beta blockers Normal cardiovascular exam+ dysrhythmias      Neuro/Psych  Headaches PSYCHIATRIC DISORDERS  Depression       GI/Hepatic Neg liver ROS, hiatal hernia,GERD  Medicated and Controlled,,  Endo/Other  diabetes, Type 2, Insulin Dependent, Oral Hypoglycemic AgentsHypothyroidism   Obesity   Renal/GU negative Renal ROS     Musculoskeletal  (+) Arthritis , Osteoarthritis,    Abdominal   Peds  Hematology  (+) Blood dyscrasia, anemia  Alpha thalassemia minor    Anesthesia Other Findings    Reproductive/Obstetrics                             Anesthesia Physical Anesthesia Plan  ASA: 3  Anesthesia Plan: General   Post-op Pain Management: Tylenol PO (pre-op)*   Induction: Intravenous  PONV Risk Score and Plan: 3 and Treatment may vary due to age or medical condition, Ondansetron, Midazolam and Dexamethasone  Airway Management Planned: Oral ETT  Additional Equipment: None  Intra-op Plan:   Post-operative Plan: Extubation in OR  Informed Consent: I have reviewed the patients History and Physical, chart, labs and discussed the procedure including the risks, benefits and alternatives for the proposed  anesthesia with the patient or authorized representative who has indicated his/her understanding and acceptance.     Dental advisory given  Plan Discussed with: CRNA and Anesthesiologist  Anesthesia Plan Comments: (See PAT note 03/12/2023)       Anesthesia Quick Evaluation

## 2023-03-15 NOTE — Progress Notes (Addendum)
Anesthesia Chart Review   Case: [id number] Date/Time: 03/25/23 1005   Procedure: TOTAL HIP ARTHROPLASTY (Left: Hip)   Anesthesia type: Spinal   Pre-op diagnosis: OA LEFT HIP   Location: [facility] ROOM 08 / [facility]   Surgeons: Joen Laura, MD       DISCUSSION:72 y.o. former smoker with h/o PONV, sleep apnea, DM II, left hip OA scheduled for above procedure 03/25/2023 with Dr. Weber Cooks.   Cardiac cath 02/05/2023 with no significant coronary artery disease.   Pt seen by cardiology 02/07/2023. Per OV note, "Patient may proceed with planned surgery at low risk She will continue beta blocker and high intensity statin Continue other medications as prescribed Fall precautions Patient was advised to visit ED or contact this clinic should any significant or worsening cardiac symptoms occur before next visit."  Clearance from PCP on chart which states pt is low risk for planned procedure.  VS: BP 126/71   Pulse 69   Temp 36.8 C (Oral)   Resp 18   Ht 5\' 2"  (1.575 m)   Wt 83.5 kg   SpO2 100%   BMI 33.65 kg/m   PROVIDERS: Eartha Inch, MD is PCP   [facility] is Cardiologist LABS: Labs reviewed: Acceptable for surgery. (all labs ordered are listed, but only abnormal results are displayed)  Labs Reviewed  HEMOGLOBIN A1C - Abnormal; Notable for the following components:      Result Value   Hgb A1c MFr Bld 4.5 (*)    All other components within normal limits  CBC WITH DIFFERENTIAL/PLATELET - Abnormal; Notable for the following components:   Hemoglobin 11.3 (*)    HCT 33.2 (*)    RDW 15.7 (*)    Platelets 135 (*)    All other components within normal limits  COMPREHENSIVE METABOLIC PANEL - Abnormal; Notable for the following components:   Sodium 133 (*)    Glucose, Bld 106 (*)    Total Protein 6.3 (*)    All other components within normal limits  SURGICAL PCR SCREEN  TYPE AND SCREEN      IMAGES:   EKG:   CV: Myocardial Perfusion 03/07/2022 CONCLUSIONS:  1. This myocardial perfusion imaging study is negative for coronary artery disease  2. Normal LV systolic function   Echo 12/04/2021 Technically difficult but adequate 2D M-mode and color flow Doppler  echocardiogram demonstrates grossly normal left ventricular chamber size  and contractility.  Ejection fraction is grossly normal.  2.  The aortic valve is mildly thickened but opens adequately.  There is  no significant aortic stenosis or aortic insufficiency  3.  The mitral and tricuspid valves are grossly normal.  4.  The atria are of normal size bilaterally  5.  Right ventricular structure and function is normal  6.  The pericardium is of normal thickness there is no effusion  Past Medical History:  Diagnosis Date   Alpha (0) thalassemia (HCC)    Minor   Alpha thalassemia minor    Arthritis    Asthma    Complication of anesthesia    Combative   Depression    Diabetes mellitus without complication (HCC)    Diabetes mellitus without complication (HCC)    type 2    Dysrhythmia    Familial Mediterranean fever (HCC)    GERD (gastroesophageal reflux disease)    Headache    History of hiatal hernia    Hypertension    Hypothyroidism  Pneumonia    PONV (postoperative nausea and vomiting) 1979   Sleep apnea    Sleep apnea    No CPOP    Past Surgical History:  Procedure Laterality Date   APPENDECTOMY     ARTHROSCOPY KNEE W/ DRILLING     CESAREAN SECTION     x2   CESAREAN SECTION  1610,9604   CHOLECYSTECTOMY     CHOLECYSTECTOMY  1975   CYST EXCISION Left    arm   EXPLORATORY LAPAROTOMY  1985   HERNIA REPAIR     x3   HERNIA REPAIR     3 hernias   KNEE SURGERY Left 1900   LAPAROTOMY  1985   LEG AMPUTATION     AKA   LEG AMPUTATION Right 2012   REPLACEMENT TOTAL KNEE Bilateral 05/2004   REVERSE SHOULDER ARTHROPLASTY Left 02/11/2019   Procedure: REVERSE SHOULDER ARTHROPLASTY;  Surgeon:  Bjorn Pippin, MD;  Location: [facility];  Service: Orthopedics;  Laterality: Left;   REVERSE SHOULDER ARTHROPLASTY Right 07/18/2022   Procedure: REVERSE SHOULDER ARTHROPLASTY;  Surgeon: Bjorn Pippin, MD;  Location: [facility];  Service: Orthopedics;  Laterality: Right;   THIGH FASCIOTOMY Right    TONSILLECTOMY     TOTAL SHOULDER ARTHROPLASTY Right 02/11/2019   TOTAL SHOULDER ARTHROPLASTY     ULNAR NERVE TRANSPOSITION Left 08/12/2019   Procedure: LEFT ELBOW ULNAR NERVE DISCOMPRESSION AND TRANSPOSITION;  Surgeon: Bjorn Pippin, MD;  Location: [facility];  Service: Orthopedics;  Laterality: Left;    MEDICATIONS:  amLODipine (NORVASC) 2.5 MG tablet   atorvastatin (LIPITOR) 20 MG tablet   buPROPion (WELLBUTRIN XL) 300 MG 24 hr tablet   butalbital-acetaminophen-caffeine (FIORICET) 50-325-40 MG tablet   Calcium Carbonate Antacid (ALKA-SELTZER ANTACID PO)   celecoxib (CELEBREX) 100 MG capsule   colchicine 0.6 MG tablet   docusate sodium (COLACE) 250 MG capsule   fluticasone (FLONASE) 50 MCG/ACT nasal spray   insulin lispro (HUMALOG) 100 UNIT/ML KwikPen   ipratropium (ATROVENT) 0.02 % nebulizer solution   lamoTRIgine (LAMICTAL) 200 MG tablet   LANTUS SOLOSTAR 100 UNIT/ML Solostar Pen   metFORMIN (GLUCOPHAGE-XR) 500 MG 24 hr tablet   metoprolol succinate (TOPROL-XL) 25 MG 24 hr tablet   nortriptyline (PAMELOR) 75 MG capsule   olmesartan (BENICAR) 20 MG tablet   oxyCODONE (OXY IR/ROXICODONE) 5 MG immediate release tablet   pantoprazole (PROTONIX) 40 MG tablet   traMADol (ULTRAM) 50 MG tablet   traZODone (DESYREL) 50 MG tablet   No current facility-administered medications for this encounter.    Jodell Cipro Ward, PA-C [facility] [phone]

## 2023-03-25 ENCOUNTER — Other Ambulatory Visit: Payer: Self-pay

## 2023-03-25 ENCOUNTER — Ambulatory Visit (HOSPITAL_COMMUNITY): Payer: Medicare Other | Admitting: Physician Assistant

## 2023-03-25 ENCOUNTER — Observation Stay (HOSPITAL_COMMUNITY): Payer: Medicare Other

## 2023-03-25 ENCOUNTER — Observation Stay (HOSPITAL_COMMUNITY)
Admission: RE | Admit: 2023-03-25 | Discharge: 2023-03-28 | Disposition: A | Discharge status: Skilled Nursing Facility | Payer: Medicare Other | Source: Ambulatory Visit | Attending: Orthopedic Surgery | Admitting: Orthopedic Surgery

## 2023-03-25 ENCOUNTER — Ambulatory Visit (HOSPITAL_COMMUNITY): Payer: Medicare Other

## 2023-03-25 ENCOUNTER — Encounter (HOSPITAL_COMMUNITY): Payer: Self-pay | Admitting: Orthopedic Surgery

## 2023-03-25 ENCOUNTER — Encounter (HOSPITAL_COMMUNITY)
Admission: RE | Disposition: A | Discharge status: Skilled Nursing Facility | Payer: Self-pay | Source: Ambulatory Visit | Attending: Orthopedic Surgery

## 2023-03-25 ENCOUNTER — Ambulatory Visit (HOSPITAL_BASED_OUTPATIENT_CLINIC_OR_DEPARTMENT_OTHER): Payer: Medicare Other | Admitting: Anesthesiology

## 2023-03-25 DIAGNOSIS — Z79899 Other long term (current) drug therapy: Secondary | ICD-10-CM | POA: Diagnosis not present

## 2023-03-25 DIAGNOSIS — E119 Type 2 diabetes mellitus without complications: Secondary | ICD-10-CM

## 2023-03-25 DIAGNOSIS — Z87891 Personal history of nicotine dependence: Secondary | ICD-10-CM | POA: Insufficient documentation

## 2023-03-25 DIAGNOSIS — Z96612 Presence of left artificial shoulder joint: Secondary | ICD-10-CM | POA: Diagnosis not present

## 2023-03-25 DIAGNOSIS — E039 Hypothyroidism, unspecified: Secondary | ICD-10-CM | POA: Diagnosis not present

## 2023-03-25 DIAGNOSIS — M1612 Unilateral primary osteoarthritis, left hip: Secondary | ICD-10-CM

## 2023-03-25 DIAGNOSIS — Z96653 Presence of artificial knee joint, bilateral: Secondary | ICD-10-CM | POA: Diagnosis not present

## 2023-03-25 DIAGNOSIS — G8929 Other chronic pain: Principal | ICD-10-CM | POA: Diagnosis present

## 2023-03-25 DIAGNOSIS — Z7984 Long term (current) use of oral hypoglycemic drugs: Secondary | ICD-10-CM | POA: Insufficient documentation

## 2023-03-25 DIAGNOSIS — I10 Essential (primary) hypertension: Secondary | ICD-10-CM | POA: Diagnosis not present

## 2023-03-25 DIAGNOSIS — Z96611 Presence of right artificial shoulder joint: Secondary | ICD-10-CM | POA: Diagnosis not present

## 2023-03-25 DIAGNOSIS — J45909 Unspecified asthma, uncomplicated: Secondary | ICD-10-CM | POA: Insufficient documentation

## 2023-03-25 DIAGNOSIS — Z794 Long term (current) use of insulin: Secondary | ICD-10-CM | POA: Insufficient documentation

## 2023-03-25 HISTORY — PX: TOTAL HIP ARTHROPLASTY: SHX124

## 2023-03-25 LAB — GLUCOSE, CAPILLARY
Glucose-Capillary: 209 mg/dL — ABNORMAL HIGH (ref 70–99)
Glucose-Capillary: 206 mg/dL — ABNORMAL HIGH (ref 70–99)
Glucose-Capillary: 243 mg/dL — ABNORMAL HIGH (ref 70–99)
Glucose-Capillary: 223 mg/dL — ABNORMAL HIGH (ref 70–99)

## 2023-03-25 SURGERY — ARTHROPLASTY, HIP, TOTAL,POSTERIOR APPROACH
Anesthesia: General | Site: Hip | Laterality: Left

## 2023-03-25 MED ORDER — FLUTICASONE PROPIONATE 50 MCG/ACT NA SUSP: 2.0000 | Freq: Every day | NASAL | Status: DC | PRN

## 2023-03-25 MED ORDER — 0.9 % SODIUM CHLORIDE (POUR BTL) OPTIME
TOPICAL | Status: DC | PRN
  Administered 2023-03-25: 1000 mL

## 2023-03-25 MED ORDER — BUPROPION HCL ER (XL) 300 MG PO TB24
300.0000 mg | ORAL_TABLET | Freq: Every day | ORAL | Status: DC
  Administered 2023-03-26 – 2023-03-28 (×3): 300 mg via ORAL
  Filled 2023-03-25 (×3): qty 1

## 2023-03-25 MED ORDER — MIDAZOLAM HCL 2 MG/2ML IJ SOLN
INTRAMUSCULAR | Status: AC
  Filled 2023-03-25: qty 2

## 2023-03-25 MED ORDER — CEFAZOLIN SODIUM-DEXTROSE 2-4 GM/100ML-% IV SOLN
2.0000 g | Freq: Four times a day (QID) | INTRAVENOUS | Status: AC
  Administered 2023-03-25 – 2023-03-28 (×12): 2 g via INTRAVENOUS
  Filled 2023-03-25 (×12): qty 100

## 2023-03-25 MED ORDER — ACETAMINOPHEN 10 MG/ML IV SOLN
INTRAVENOUS | Status: AC
  Filled 2023-03-25: qty 100

## 2023-03-25 MED ORDER — FENTANYL CITRATE (PF) 100 MCG/2ML IJ SOLN
INTRAMUSCULAR | Status: AC
  Filled 2023-03-25: qty 2

## 2023-03-25 MED ORDER — SODIUM CHLORIDE (PF) 0.9 % IJ SOLN
INTRAMUSCULAR | Status: AC
  Filled 2023-03-25: qty 10

## 2023-03-25 MED ORDER — ONDANSETRON HCL 4 MG/2ML IJ SOLN
INTRAMUSCULAR | Status: DC | PRN
  Administered 2023-03-25: 4 mg via INTRAVENOUS

## 2023-03-25 MED ORDER — LACTATED RINGERS IV SOLN: INTRAVENOUS | Status: DC

## 2023-03-25 MED ORDER — DOCUSATE SODIUM 100 MG PO CAPS
100.0000 mg | ORAL_CAPSULE | Freq: Two times a day (BID) | ORAL | Status: DC
  Filled 2023-03-25 (×4): qty 1

## 2023-03-25 MED ORDER — SENNA 8.6 MG PO TABS
1.0000 | ORAL_TABLET | Freq: Two times a day (BID) | ORAL | Status: DC
  Administered 2023-03-26 – 2023-03-28 (×5): 8.6 mg via ORAL
  Filled 2023-03-25 (×6): qty 1

## 2023-03-25 MED ORDER — CHLORHEXIDINE GLUCONATE 0.12 % MT SOLN
15.0000 mL | Freq: Once | OROMUCOSAL | Status: AC
  Administered 2023-03-25: 15 mL via OROMUCOSAL

## 2023-03-25 MED ORDER — FENTANYL CITRATE (PF) 100 MCG/2ML IJ SOLN
INTRAMUSCULAR | Status: DC | PRN
  Administered 2023-03-25 (×2): 50 ug via INTRAVENOUS
  Administered 2023-03-25: 25 ug via INTRAVENOUS
  Administered 2023-03-25: 75 ug via INTRAVENOUS

## 2023-03-25 MED ORDER — DEXAMETHASONE SODIUM PHOSPHATE 10 MG/ML IJ SOLN
INTRAMUSCULAR | Status: DC | PRN
  Administered 2023-03-25: 10 mg via INTRAVENOUS

## 2023-03-25 MED ORDER — SODIUM CHLORIDE (PF) 0.9 % IJ SOLN
INTRAMUSCULAR | Status: DC | PRN
  Administered 2023-03-25: 30 mL via INTRAVENOUS

## 2023-03-25 MED ORDER — ISOPROPYL ALCOHOL 70 % SOLN
Status: DC | PRN
  Administered 2023-03-25: 1 via TOPICAL

## 2023-03-25 MED ORDER — ONDANSETRON HCL 4 MG PO TABS: 4.0000 mg | ORAL_TABLET | Freq: Four times a day (QID) | ORAL | Status: DC | PRN

## 2023-03-25 MED ORDER — INSULIN ASPART 100 UNIT/ML IJ SOLN
0.0000 [IU] | Freq: Three times a day (TID) | INTRAMUSCULAR | Status: DC
  Administered 2023-03-25: 5 [IU] via SUBCUTANEOUS
  Administered 2023-03-26 (×2): 2 [IU] via SUBCUTANEOUS
  Administered 2023-03-26: 5 [IU] via SUBCUTANEOUS
  Administered 2023-03-27: 2 [IU] via SUBCUTANEOUS
  Administered 2023-03-27 – 2023-03-28 (×3): 3 [IU] via SUBCUTANEOUS
  Administered 2023-03-28: 2 [IU] via SUBCUTANEOUS

## 2023-03-25 MED ORDER — LORATADINE 10 MG PO TABS
10.0000 mg | ORAL_TABLET | Freq: Every day | ORAL | Status: DC
  Administered 2023-03-26 – 2023-03-28 (×3): 10 mg via ORAL
  Filled 2023-03-25 (×3): qty 1

## 2023-03-25 MED ORDER — DEXMEDETOMIDINE HCL IN NACL 80 MCG/20ML IV SOLN
INTRAVENOUS | Status: DC | PRN
Start: 2023-03-25 — End: 2023-03-25
  Administered 2023-03-25: 8 ug via INTRAVENOUS
  Administered 2023-03-25: 4 ug via INTRAVENOUS
  Administered 2023-03-25: 8 ug via INTRAVENOUS

## 2023-03-25 MED ORDER — BUPIVACAINE LIPOSOME 1.3 % IJ SUSP
INTRAMUSCULAR | Status: AC
  Filled 2023-03-25: qty 20

## 2023-03-25 MED ORDER — FENTANYL CITRATE PF 50 MCG/ML IJ SOSY
PREFILLED_SYRINGE | INTRAMUSCULAR | Status: AC
  Administered 2023-03-25: 25 ug via INTRAVENOUS
  Filled 2023-03-25: qty 2

## 2023-03-25 MED ORDER — BUPIVACAINE LIPOSOME 1.3 % IJ SUSP: 10.0000 mL | Freq: Once | INTRAMUSCULAR | Status: DC

## 2023-03-25 MED ORDER — PROPOFOL 1000 MG/100ML IV EMUL
INTRAVENOUS | Status: AC
  Filled 2023-03-25: qty 100

## 2023-03-25 MED ORDER — AMLODIPINE BESYLATE 5 MG PO TABS
2.5000 mg | ORAL_TABLET | Freq: Every day | ORAL | Status: DC
  Administered 2023-03-26 – 2023-03-28 (×3): 2.5 mg via ORAL
  Filled 2023-03-25 (×3): qty 1

## 2023-03-25 MED ORDER — ONDANSETRON HCL 4 MG/2ML IJ SOLN
INTRAMUSCULAR | Status: AC
  Filled 2023-03-25: qty 2

## 2023-03-25 MED ORDER — SODIUM CHLORIDE (PF) 0.9 % IJ SOLN
INTRAMUSCULAR | Status: AC
  Filled 2023-03-25: qty 50

## 2023-03-25 MED ORDER — BUPIVACAINE-EPINEPHRINE (PF) 0.5% -1:200000 IJ SOLN
INTRAMUSCULAR | Status: AC
  Filled 2023-03-25: qty 30

## 2023-03-25 MED ORDER — LIDOCAINE 2% (20 MG/ML) 5 ML SYRINGE
INTRAMUSCULAR | Status: DC | PRN
  Administered 2023-03-25: 1000 mg via INTRAVENOUS

## 2023-03-25 MED ORDER — INSULIN LISPRO (1 UNIT DIAL) 100 UNIT/ML (KWIKPEN): 2.0000 [IU] | PEN_INJECTOR | Freq: Three times a day (TID) | SUBCUTANEOUS | Status: DC

## 2023-03-25 MED ORDER — POLYETHYLENE GLYCOL 3350 17 G PO PACK: 17.0000 g | PACK | Freq: Every day | ORAL | Status: DC | PRN

## 2023-03-25 MED ORDER — ACETAMINOPHEN 500 MG PO TABS
1000.0000 mg | ORAL_TABLET | Freq: Once | ORAL | Status: AC
  Administered 2023-03-25: 1000 mg via ORAL
  Filled 2023-03-25: qty 2

## 2023-03-25 MED ORDER — ONDANSETRON HCL 4 MG/2ML IJ SOLN: 4.0000 mg | Freq: Four times a day (QID) | INTRAMUSCULAR | Status: DC | PRN

## 2023-03-25 MED ORDER — METOPROLOL SUCCINATE ER 25 MG PO TB24
25.0000 mg | ORAL_TABLET | Freq: Every day | ORAL | Status: DC
  Administered 2023-03-26 – 2023-03-28 (×3): 25 mg via ORAL
  Filled 2023-03-25 (×3): qty 1

## 2023-03-25 MED ORDER — SODIUM CHLORIDE 0.9 % IR SOLN
Status: DC | PRN
  Administered 2023-03-25: 3000 mL

## 2023-03-25 MED ORDER — SUGAMMADEX SODIUM 200 MG/2ML IV SOLN
INTRAVENOUS | Status: DC | PRN
  Administered 2023-03-25: 200 mg via INTRAVENOUS

## 2023-03-25 MED ORDER — HYDROCODONE-ACETAMINOPHEN 5-325 MG PO TABS
1.0000 | ORAL_TABLET | ORAL | Status: DC | PRN
  Administered 2023-03-25 – 2023-03-26 (×5): 1 via ORAL
  Filled 2023-03-25 (×5): qty 1

## 2023-03-25 MED ORDER — PHENOL 1.4 % MT LIQD: 1.0000 | OROMUCOSAL | Status: DC | PRN

## 2023-03-25 MED ORDER — CEFAZOLIN SODIUM-DEXTROSE 2-4 GM/100ML-% IV SOLN
2.0000 g | INTRAVENOUS | Status: AC
  Administered 2023-03-25: 2 g via INTRAVENOUS
  Filled 2023-03-25: qty 100

## 2023-03-25 MED ORDER — PHENYLEPHRINE 80 MCG/ML (10ML) SYRINGE FOR IV PUSH (FOR BLOOD PRESSURE SUPPORT)
PREFILLED_SYRINGE | INTRAVENOUS | Status: DC | PRN
  Administered 2023-03-25: 160 ug via INTRAVENOUS
  Administered 2023-03-25 (×2): 80 ug via INTRAVENOUS

## 2023-03-25 MED ORDER — DEXMEDETOMIDINE HCL IN NACL 80 MCG/20ML IV SOLN
INTRAVENOUS | Status: AC
  Filled 2023-03-25: qty 20

## 2023-03-25 MED ORDER — METHOCARBAMOL 500 MG PO TABS
500.0000 mg | ORAL_TABLET | Freq: Four times a day (QID) | ORAL | Status: DC | PRN
  Administered 2023-03-26 – 2023-03-28 (×7): 500 mg via ORAL
  Filled 2023-03-25 (×7): qty 1

## 2023-03-25 MED ORDER — ATORVASTATIN CALCIUM 20 MG PO TABS
20.0000 mg | ORAL_TABLET | Freq: Every evening | ORAL | Status: DC
  Administered 2023-03-26 – 2023-03-27 (×2): 20 mg via ORAL
  Filled 2023-03-25 (×2): qty 1

## 2023-03-25 MED ORDER — IPRATROPIUM BROMIDE 0.02 % IN SOLN: 0.5000 mg | Freq: Four times a day (QID) | RESPIRATORY_TRACT | Status: DC

## 2023-03-25 MED ORDER — KETOROLAC TROMETHAMINE 15 MG/ML IJ SOLN
7.5000 mg | Freq: Four times a day (QID) | INTRAMUSCULAR | Status: AC
  Administered 2023-03-25 – 2023-03-26 (×4): 7.5 mg via INTRAVENOUS
  Filled 2023-03-25 (×4): qty 1

## 2023-03-25 MED ORDER — LIDOCAINE HCL (PF) 2 % IJ SOLN
INTRAMUSCULAR | Status: AC
  Filled 2023-03-25: qty 5

## 2023-03-25 MED ORDER — FENTANYL CITRATE PF 50 MCG/ML IJ SOSY
25.0000 ug | PREFILLED_SYRINGE | INTRAMUSCULAR | Status: DC | PRN
  Administered 2023-03-25 (×2): 50 ug via INTRAVENOUS

## 2023-03-25 MED ORDER — INSULIN GLARGINE-YFGN 100 UNIT/ML ~~LOC~~ SOLN
17.0000 [IU] | Freq: Every day | SUBCUTANEOUS | Status: DC
  Administered 2023-03-25 – 2023-03-27 (×3): 17 [IU] via SUBCUTANEOUS
  Filled 2023-03-25 (×4): qty 0.17

## 2023-03-25 MED ORDER — BUPIVACAINE-EPINEPHRINE 0.5% -1:200000 IJ SOLN
INTRAMUSCULAR | Status: DC | PRN
Start: 2023-03-25 — End: 2023-03-25
  Administered 2023-03-25: 15 mL

## 2023-03-25 MED ORDER — TRANEXAMIC ACID-NACL 1000-0.7 MG/100ML-% IV SOLN
1000.0000 mg | INTRAVENOUS | Status: AC
  Administered 2023-03-25: 1000 mg via INTRAVENOUS
  Filled 2023-03-25: qty 100

## 2023-03-25 MED ORDER — MIDAZOLAM HCL 5 MG/5ML IJ SOLN
INTRAMUSCULAR | Status: DC | PRN
  Administered 2023-03-25: 1 mg via INTRAVENOUS

## 2023-03-25 MED ORDER — METHOCARBAMOL 500 MG IVPB - SIMPLE MED
INTRAVENOUS | Status: AC
  Filled 2023-03-25: qty 55

## 2023-03-25 MED ORDER — ASPIRIN 81 MG PO CHEW
81.0000 mg | CHEWABLE_TABLET | Freq: Two times a day (BID) | ORAL | Status: DC
  Administered 2023-03-25 – 2023-03-28 (×6): 81 mg via ORAL
  Filled 2023-03-25 (×6): qty 1

## 2023-03-25 MED ORDER — ROCURONIUM BROMIDE 10 MG/ML (PF) SYRINGE
PREFILLED_SYRINGE | INTRAVENOUS | Status: AC
  Filled 2023-03-25: qty 10

## 2023-03-25 MED ORDER — METFORMIN HCL ER 500 MG PO TB24
500.0000 mg | ORAL_TABLET | Freq: Two times a day (BID) | ORAL | Status: DC
  Administered 2023-03-26 – 2023-03-28 (×5): 500 mg via ORAL
  Filled 2023-03-25 (×5): qty 1

## 2023-03-25 MED ORDER — IRBESARTAN 150 MG PO TABS
150.0000 mg | ORAL_TABLET | Freq: Every day | ORAL | Status: DC
  Administered 2023-03-26 – 2023-03-28 (×3): 150 mg via ORAL
  Filled 2023-03-25 (×3): qty 1

## 2023-03-25 MED ORDER — PHENYLEPHRINE 80 MCG/ML (10ML) SYRINGE FOR IV PUSH (FOR BLOOD PRESSURE SUPPORT)
PREFILLED_SYRINGE | INTRAVENOUS | Status: AC
  Filled 2023-03-25: qty 10

## 2023-03-25 MED ORDER — WATER FOR IRRIGATION, STERILE IR SOLN
Status: DC | PRN
  Administered 2023-03-25: 2000 mL

## 2023-03-25 MED ORDER — ACETAMINOPHEN 10 MG/ML IV SOLN
1000.0000 mg | Freq: Once | INTRAVENOUS | Status: AC
  Administered 2023-03-25: 1000 mg via INTRAVENOUS

## 2023-03-25 MED ORDER — METHOCARBAMOL 500 MG IVPB - SIMPLE MED
500.0000 mg | Freq: Four times a day (QID) | INTRAVENOUS | Status: DC | PRN
  Administered 2023-03-25: 500 mg via INTRAVENOUS

## 2023-03-25 MED ORDER — LACTATED RINGERS IV SOLN: INTRAVENOUS | Status: DC | PRN

## 2023-03-25 MED ORDER — ORAL CARE MOUTH RINSE: 15.0000 mL | Freq: Once | OROMUCOSAL | Status: AC

## 2023-03-25 MED ORDER — NORTRIPTYLINE HCL 25 MG PO CAPS
75.0000 mg | ORAL_CAPSULE | Freq: Every day | ORAL | Status: DC
  Administered 2023-03-26 – 2023-03-27 (×3): 75 mg via ORAL
  Filled 2023-03-25 (×4): qty 3

## 2023-03-25 MED ORDER — ROCURONIUM BROMIDE 10 MG/ML (PF) SYRINGE
PREFILLED_SYRINGE | INTRAVENOUS | Status: DC | PRN
  Administered 2023-03-25: 50 mg via INTRAVENOUS

## 2023-03-25 MED ORDER — DEXAMETHASONE SODIUM PHOSPHATE 4 MG/ML IJ SOLN: 4.0000 mg | Freq: Once | INTRAMUSCULAR | Status: DC

## 2023-03-25 MED ORDER — PROMETHAZINE HCL 25 MG/ML IJ SOLN: 6.2500 mg | INTRAMUSCULAR | Status: DC | PRN

## 2023-03-25 MED ORDER — LAMOTRIGINE 100 MG PO TABS
200.0000 mg | ORAL_TABLET | Freq: Every evening | ORAL | Status: DC
  Administered 2023-03-25 – 2023-03-27 (×3): 200 mg via ORAL
  Filled 2023-03-25 (×3): qty 2

## 2023-03-25 MED ORDER — POVIDONE-IODINE 10 % EX SWAB
2.0000 | Freq: Once | CUTANEOUS | Status: AC
  Administered 2023-03-25: 2 via TOPICAL

## 2023-03-25 MED ORDER — DIPHENHYDRAMINE HCL 12.5 MG/5ML PO ELIX
12.5000 mg | ORAL_SOLUTION | ORAL | Status: DC | PRN
  Administered 2023-03-26 – 2023-03-27 (×2): 25 mg via ORAL
  Filled 2023-03-25 (×2): qty 10

## 2023-03-25 MED ORDER — PROPOFOL 10 MG/ML IV BOLUS
INTRAVENOUS | Status: DC | PRN
Start: 2023-03-25 — End: 2023-03-25
  Administered 2023-03-25: 100 mg via INTRAVENOUS

## 2023-03-25 MED ORDER — FENTANYL CITRATE PF 50 MCG/ML IJ SOSY
PREFILLED_SYRINGE | INTRAMUSCULAR | Status: AC
  Administered 2023-03-25: 50 ug via INTRAVENOUS
  Filled 2023-03-25: qty 3

## 2023-03-25 MED ORDER — PANTOPRAZOLE SODIUM 40 MG PO TBEC
40.0000 mg | DELAYED_RELEASE_TABLET | Freq: Every day | ORAL | Status: DC
  Administered 2023-03-25 – 2023-03-28 (×4): 40 mg via ORAL
  Filled 2023-03-25 (×4): qty 1

## 2023-03-25 MED ORDER — TRAZODONE HCL 100 MG PO TABS
100.0000 mg | ORAL_TABLET | Freq: Every day | ORAL | Status: DC
  Administered 2023-03-25 – 2023-03-27 (×3): 100 mg via ORAL
  Filled 2023-03-25 (×3): qty 1

## 2023-03-25 MED ORDER — DEXAMETHASONE SODIUM PHOSPHATE 10 MG/ML IJ SOLN
INTRAMUSCULAR | Status: AC
  Filled 2023-03-25: qty 1

## 2023-03-25 MED ORDER — ACETAMINOPHEN 500 MG PO TABS
1000.0000 mg | ORAL_TABLET | Freq: Four times a day (QID) | ORAL | Status: DC
  Administered 2023-03-26: 500 mg via ORAL
  Administered 2023-03-26: 1000 mg via ORAL
  Filled 2023-03-25 (×3): qty 2

## 2023-03-25 MED ORDER — MENTHOL 3 MG MT LOZG
1.0000 | LOZENGE | OROMUCOSAL | Status: DC | PRN
  Administered 2023-03-27: 3 mg via ORAL
  Filled 2023-03-25: qty 9

## 2023-03-25 MED ORDER — SODIUM CHLORIDE 0.9 % IV SOLN: INTRAVENOUS | Status: DC

## 2023-03-25 MED ORDER — PROPOFOL 10 MG/ML IV BOLUS
INTRAVENOUS | Status: AC
  Filled 2023-03-25: qty 20

## 2023-03-25 MED ORDER — FENTANYL CITRATE PF 50 MCG/ML IJ SOSY: 25.0000 ug | PREFILLED_SYRINGE | INTRAMUSCULAR | Status: DC | PRN

## 2023-03-25 MED ORDER — ACETAMINOPHEN 325 MG PO TABS: 325.0000 mg | ORAL_TABLET | Freq: Four times a day (QID) | ORAL | Status: DC | PRN

## 2023-03-25 MED ORDER — BUPIVACAINE LIPOSOME 1.3 % IJ SUSP
INTRAMUSCULAR | Status: DC | PRN
  Administered 2023-03-25: 20 mL

## 2023-03-25 SURGICAL SUPPLY — 72 items
ADH SKN CLS APL DERMABOND .7 (GAUZE/BANDAGES/DRESSINGS) ×1
APL PRP STRL LF DISP 70% ISPRP (MISCELLANEOUS) ×2
BAG COUNTER SPONGE SURGICOUNT (BAG) IMPLANT
BAG SPEC THK2 15X12 ZIP CLS (MISCELLANEOUS) ×1
BAG SPNG CNTER NS LX DISP (BAG)
BAG ZIPLOCK 12X15 (MISCELLANEOUS) ×1 IMPLANT
BLADE SAW SAG 25X90X1.19 (BLADE) ×1 IMPLANT
CHLORAPREP W/TINT 26 (MISCELLANEOUS) ×2 IMPLANT
CNTNR URN SCR LID CUP LEK RST (MISCELLANEOUS) ×1 IMPLANT
CONT SPEC 4OZ STRL OR WHT (MISCELLANEOUS) ×1
COVER SURGICAL LIGHT HANDLE (MISCELLANEOUS) ×1 IMPLANT
DERMABOND ADVANCED .7 DNX12 (GAUZE/BANDAGES/DRESSINGS) ×1 IMPLANT
DRAPE HIP W/POCKET STRL (MISCELLANEOUS) ×1 IMPLANT
DRAPE INCISE IOBAN 66X45 STRL (DRAPES) ×1 IMPLANT
DRAPE INCISE IOBAN 85X60 (DRAPES) ×1 IMPLANT
DRAPE POUCH INSTRU U-SHP 10X18 (DRAPES) ×1 IMPLANT
DRAPE SHEET LG 3/4 BI-LAMINATE (DRAPES) ×3 IMPLANT
DRAPE U-SHAPE 47X51 STRL (DRAPES) ×2 IMPLANT
DRESSING AQUACEL AG SP 3.5X10 (GAUZE/BANDAGES/DRESSINGS) ×1 IMPLANT
DRSG AQUACEL AG ADV 3.5X10 (GAUZE/BANDAGES/DRESSINGS) IMPLANT
DRSG AQUACEL AG SP 3.5X10 (GAUZE/BANDAGES/DRESSINGS) ×1
ELECT BLADE TIP CTD 4 INCH (ELECTRODE) ×1 IMPLANT
ELECT REM PT RETURN 15FT ADLT (MISCELLANEOUS) ×1 IMPLANT
GAUZE SPONGE 4X4 12PLY STRL (GAUZE/BANDAGES/DRESSINGS) ×1 IMPLANT
GLOVE BIO SURGEON STRL SZ 6.5 (GLOVE) ×2 IMPLANT
GLOVE BIOGEL PI IND STRL 6.5 (GLOVE) ×1 IMPLANT
GLOVE BIOGEL PI IND STRL 8 (GLOVE) ×1 IMPLANT
GLOVE SURG ORTHO 8.0 STRL STRW (GLOVE) ×2 IMPLANT
GOWN STRL REUS W/ TWL XL LVL3 (GOWN DISPOSABLE) ×2 IMPLANT
GOWN STRL REUS W/TWL XL LVL3 (GOWN DISPOSABLE) ×2
HANDPIECE INTERPULSE COAX TIP (DISPOSABLE) ×1
HEAD CERAMIC FEMORAL 36MM (Head) IMPLANT
HOLDER FOLEY CATH W/STRAP (MISCELLANEOUS) ×1 IMPLANT
HOOD PEEL AWAY T7 (MISCELLANEOUS) ×3 IMPLANT
INSERT 0 DEGREE 36 (Miscellaneous) IMPLANT
KIT BASIN OR (CUSTOM PROCEDURE TRAY) ×1 IMPLANT
KIT TURNOVER KIT A (KITS) IMPLANT
MANIFOLD NEPTUNE II (INSTRUMENTS) ×1 IMPLANT
MARKER SKIN DUAL TIP RULER LAB (MISCELLANEOUS) ×1 IMPLANT
NDL SAFETY ECLIP 18X1.5 (MISCELLANEOUS) ×2 IMPLANT
NS IRRIG 1000ML POUR BTL (IV SOLUTION) ×1 IMPLANT
PACK TOTAL JOINT (CUSTOM PROCEDURE TRAY) ×1 IMPLANT
PAD ARMBOARD 7.5X6 YLW CONV (MISCELLANEOUS) ×1 IMPLANT
PRESSURIZER FEMORAL UNIV (MISCELLANEOUS) IMPLANT
PROTECTOR NERVE ULNAR (MISCELLANEOUS) IMPLANT
RETRIEVER SUT HEWSON (MISCELLANEOUS) ×1 IMPLANT
SCREW HEX LP 6.5X25 (Screw) IMPLANT
SEALER BIPOLAR AQUA 6.0 (INSTRUMENTS) IMPLANT
SET HNDPC FAN SPRY TIP SCT (DISPOSABLE) IMPLANT
SHELL TRIDENT II CLUST 50 (Shell) IMPLANT
SHIELD FACE FULL FLUID (MISCELLANEOUS) ×1 IMPLANT
SOLUTION IRRIG SURGIPHOR (IV SOLUTION) IMPLANT
SPIKE FLUID TRANSFER (MISCELLANEOUS) ×1 IMPLANT
STEM HIP 127 DEG (Stem) IMPLANT
SUCTION TUBE FRAZIER 12FR DISP (SUCTIONS) ×1 IMPLANT
SUT BONE WAX W31G (SUTURE) ×1 IMPLANT
SUT ETHIBOND #5 BRAIDED 30INL (SUTURE) ×1 IMPLANT
SUT MNCRL AB 3-0 PS2 18 (SUTURE) ×1 IMPLANT
SUT STRATAFIX 0 PDS 27 VIOLET (SUTURE) ×1
SUT STRATAFIX PDO 1 14 VIOLET (SUTURE) ×1
SUT STRATFX PDO 1 14 VIOLET (SUTURE) ×1
SUT VIC AB 2-0 CT2 27 (SUTURE) ×2 IMPLANT
SUTURE STRATFX 0 PDS 27 VIOLET (SUTURE) ×1 IMPLANT
SUTURE STRATFX PDO 1 14 VIOLET (SUTURE) ×1 IMPLANT
SYR 30ML LL (SYRINGE) ×1 IMPLANT
SYR 50ML LL SCALE MARK (SYRINGE) ×1 IMPLANT
TOWEL OR 17X26 10 PK STRL BLUE (TOWEL DISPOSABLE) ×1 IMPLANT
TOWER CARTRIDGE SMART MIX (DISPOSABLE) IMPLANT
TRAY FOLEY MTR SLVR 16FR STAT (SET/KITS/TRAYS/PACK) IMPLANT
TUBE SUCTION HIGH CAP CLEAR NV (SUCTIONS) ×1 IMPLANT
UNDERPAD 30X36 HEAVY ABSORB (UNDERPADS AND DIAPERS) ×1 IMPLANT
WATER STERILE IRR 1000ML POUR (IV SOLUTION) ×2 IMPLANT

## 2023-03-25 NOTE — Interval H&P Note (Signed)
The patient has been re-examined, and the chart reviewed, and there have been no interval changes to the documented history and physical.    Plan for L THA for L hip OA  The operative side was examined and the patient was confirmed to have sensation to DPN, SPN, TN intact, Motor EHL, ext, flex 5/5, and DP 2+, PT 2+, No significant edema.   The risks, benefits, and alternatives have been discussed at length with patient, and the patient is willing to proceed.  Left hip marked. Consent has been signed.

## 2023-03-25 NOTE — Transfer of Care (Signed)
Immediate Anesthesia Transfer of Care Note  Patient: Shirley Mcdonald  Procedure(s) Performed: TOTAL HIP ARTHROPLASTY (Left: Hip)  Patient Location: PACU  Anesthesia Type:General  Level of Consciousness: drowsy  Airway & Oxygen Therapy: Patient Spontanous Breathing and Patient connected to face mask oxygen  Post-op Assessment: Report given to RN and Post -op Vital signs reviewed and stable  Post vital signs: Reviewed and stable  Last Vitals:  Vitals Value Taken Time  BP 130/85 03/25/23 1321  Temp    Pulse 92 03/25/23 1323  Resp 16 03/25/23 1323  SpO2 100 % 03/25/23 1323  Vitals shown include unfiled device data.  Last Pain:  Vitals:   03/25/23 0812  TempSrc:   PainSc: 9       Patients Stated Pain Goal: 3 (03/25/23 0981)  Complications: No notable events documented.

## 2023-03-25 NOTE — Anesthesia Procedure Notes (Signed)
Procedure Name: Intubation Date/Time: 03/25/2023 11:13 AM  Performed by: Florene Route, CRNAPre-anesthesia Checklist: Patient identified, Emergency Drugs available, Suction available and Patient being monitored Patient Re-evaluated:Patient Re-evaluated prior to induction Oxygen Delivery Method: Circle system utilized Preoxygenation: Pre-oxygenation with 100% oxygen Induction Type: IV induction Ventilation: Mask ventilation without difficulty and Oral airway inserted - appropriate to patient size Laryngoscope Size: Hyacinth Meeker and 2 Grade View: Grade I Tube type: Oral Tube size: 7.5 mm Number of attempts: 1 Airway Equipment and Method: Stylet and Oral airway Placement Confirmation: ETT inserted through vocal cords under direct vision, positive ETCO2 and breath sounds checked- equal and bilateral Secured at: 21 cm Tube secured with: Tape Dental Injury: Teeth and Oropharynx as per pre-operative assessment

## 2023-03-25 NOTE — Op Note (Signed)
03/25/2023  12:50 PM  PATIENT:  Shirley Mcdonald   MRN: [id number]  PRE-OPERATIVE DIAGNOSIS: End-stage left hip osteoarthritis  POST-OPERATIVE DIAGNOSIS:  same  PROCEDURE:  Procedure(s): TOTAL HIP ARTHROPLASTY  PREOPERATIVE INDICATIONS:    Shirley Mcdonald is an 72 y.o. female who has a diagnosis of  End-stage left hip osteoarthritis and elected for surgical management after failing conservative treatment.  The risks benefits and alternatives were discussed with the patient including but not limited to the risks of nonoperative treatment, versus surgical intervention including infection, bleeding, nerve injury, periprosthetic fracture, the need for revision surgery, dislocation, leg length discrepancy, blood clots, cardiopulmonary complications, morbidity, mortality, among others, and they were willing to proceed.     OPERATIVE REPORT     SURGEON:  Weber Cooks, MD    ASSISTANT: Kathie Dike, PA-C, (Present throughout the entire procedure,  necessary for completion of procedure in a timely manner, assisting with retraction, instrumentation, and closure)     ANESTHESIA: General  ESTIMATED BLOOD LOSS: 250cc    COMPLICATIONS:  None.     COMPONENTS:   Stryker Trident 250 mm acetabular shell, 6.5 hex screws x 2, Accolade 2 with 127 degree neck angle size #5, 36+0 ceramic head Implant Name Type Inv. Item Serial No. Manufacturer Lot No. LRB No. Used Action  SHELL TRIDENT II CLUST 50 - [id number] Shell SHELL TRIDENT II CLUST 50  [employer] [id number] A Left 1 Implanted  SCREW HEX LP 6.5X25 - [id number] Screw SCREW HEX LP 6.5X25  [employer] HXAE Left 1 Implanted  SCREW HEX LP 6.5X25 - [id number] Screw SCREW HEX LP 6.5X25  [employer] HMJD Left 1 Implanted  INSERT 0 DEGREE 36 - [id number] Miscellaneous INSERT 0 DEGREE 36  [employer] [id number] Left 1 Implanted  STEM HIP 127 DEG - [id number] Stem STEM HIP 127 DEG  [employer] [id number] A Left 1 Implanted   HEAD CERAMIC FEMORAL - [id number] Head HEAD CERAMIC FEMORAL  [employer] [id number] Left 1 Implanted     PROCEDURE IN DETAIL:   The patient was met in the holding area and  identified.  The appropriate hip was identified and marked at the operative site.  The patient was then transported to the OR  and  placed under anesthesia.  At that point, the patient was  placed in the lateral decubitus position with the operative side up and  secured to the operating room table  and all bony prominences padded. A subaxillary role was also placed.    The operative lower extremity was prepped from the iliac crest to the distal leg.  Sterile draping was performed.  Preoperative antibiotics, 2 gm of ancef,1 gm of Tranexamic Acid, and 8 mg of Decadron administered. Time out was performed prior to incision.      A routine posterolateral approach was utilized via sharp dissection  carried down to the subcutaneous tissue.  Gross bleeders were Bovie coagulated.  The iliotibial band was identified and incised along the length of the skin incision through the glute max fascia.  Charnley retractor was placed with care to protect the sciatic nerve posteriorly.  With the hip internally rotated, the piriformis tendon was identified and released from the femoral insertion and tagged with a #5 Ethibond.  A capsulotomy was then performed off the femoral insertion and also tagged with a #5 Ethibond.    The femoral neck was exposed, and I resected the femoral neck based on preoperative templating relative to the lesser trochanter.    I then exposed  the deep acetabulum, cleared out any tissue including the ligamentum teres.  After adequate visualization, I excised the labrum.  I then started reaming with a 44 mm reamer, first medializing to the floor of the cotyloid fossa, and then in the position of the cup aiming towards the greater sciatic notch, matching the version of the transverse acetabular ligament and  tucked under the anterior wall. I reamed up to 50 mm reamer with good bony bed preparation and a 50 mm cup was chosen.  The real cup was then impacted into place.  Appropriate version and inclination was confirmed clinically matching their bony anatomy, and also with the use of the jig.  I placed 2 screws in the posterior superior quadrant to augment fixation.  A neutral liner was placed and impacted. It was confirmed to be appropriately seated and the acetabular retractors were removed.    I then prepared the proximal femur using the box cutter, Charnley awl, and then sequentially broached starting with 0 up to a size 5.  A trial broach, neck, and head was utilized, and I reduced the hip and it was found to have excellent stability.  There was no impingement with full extension and 90 degrees external rotation.  The hip was stable at the position of sleep and with 90 degrees flexion and 70 degrees of internal rotation.  Intra-Op flatplate was obtained and confirmed appropriate component positions.  Good fill of the femur with the size 5 broach.  And restoration of length and offset. No evidence or concern for fracture.  A final femoral prosthesis size 5 was selected. I then impacted the real femoral prosthesis into place.I again trialed and selected a 36+ 0mm ball. The hip was then reduced and taken through a range of motion. There was no impingement with full extension and 90 degrees external rotation.  The hip was stable at the position of sleep and with 90 degrees flexion and 70 degrees of internal rotation. Leg lengths were  again assessed and felt to be restored.  We then opened, and I impacted the real head ball into place.  The posterior capsule was then closed with #5 Ethibond.  The piriformis was repaired through the base of the abductor tendon using a [place] suture passer.  I then irrigated the hip copiously with dilute Betadine and with normal saline pulse lavage. Periarticular injection was  then performed with Exparel.   We repaired the fascia #1 barbed suture, followed by 0 barbed suture for the subcutaneous fat.  Skin was closed with 2-0 Vicryl and 3-0 Monocryl.  Dermabond and Aquacel dressing were applied. The patient was then awakened and returned to PACU in stable and satisfactory condition.   There were no complications.  Post op recs: WB: WBAT LLE, No formal hip precautions, nonweightbearing right upper extremity for scapular body fracture Abx: ancef Imaging: PACU pelvis Xray, will get a right shoulder x-ray this evening to determine further management of the scapula fracture Dressing: Aquacell, keep intact until follow up DVT prophylaxis: Aspirin 81BID starting POD1 Follow up: 2 weeks after surgery for a wound check with Dr. Blanchie Dessert at [facility].  Address: [address], [place], [place] [address]  Office Phone: [phone]   Weber Cooks, MD Orthopedic Surgeon

## 2023-03-25 NOTE — Addendum Note (Signed)
Addendum  created 03/25/23 1519 by Florene Route, CRNA   Intraprocedure Event edited

## 2023-03-25 NOTE — Discharge Instructions (Signed)
INSTRUCTIONS AFTER JOINT REPLACEMENT   Remove items at home which could result in a fall. This includes throw rugs or furniture in walking pathways ICE to the affected joint every three hours while awake for 30 minutes at a time, for at least the first 3-5 days, and then as needed for pain and swelling.  Continue to use ice for pain and swelling. You may notice swelling that will progress down to the foot and ankle.  This is normal after surgery.  Elevate your leg when you are not up walking on it.   Continue to use the breathing machine you got in the hospital (incentive spirometer) which will help keep your temperature down.  It is common for your temperature to cycle up and down following surgery, especially at night when you are not up moving around and exerting yourself.  The breathing machine keeps your lungs expanded and your temperature down.   DIET:  As you were doing prior to hospitalization, we recommend a well-balanced diet.  DRESSING / WOUND CARE / SHOWERING  Keep the surgical dressing until follow up.  The dressing is water proof, so you can shower without any extra covering.  IF THE DRESSING FALLS OFF or the wound gets wet inside, change the dressing with sterile gauze.  Please use good hand washing techniques before changing the dressing.  Do not use any lotions or creams on the incision until instructed by your surgeon.    ACTIVITY  Increase activity slowly as tolerated, but follow the weight bearing instructions below.   No driving for 6 weeks or until further direction given by your physician.  You cannot drive while taking narcotics.  No lifting or carrying greater than 10 lbs. until further directed by your surgeon. Avoid periods of inactivity such as sitting longer than an hour when not asleep. This helps prevent blood clots.  You may return to work once you are authorized by your doctor.     WEIGHT BEARING   Weight bearing as tolerated with assist device (walker, cane,  etc) as directed for left lower extremity, use it as long as suggested by your surgeon or therapist, typically at least 4-6 weeks.  However, refrain from bearing weight on your right upper extremity due to new scapular fracture.   EXERCISES  Results after joint replacement surgery are often greatly improved when you follow the exercise, range of motion and muscle strengthening exercises prescribed by your doctor. Safety measures are also important to protect the joint from further injury. Any time any of these exercises cause you to have increased pain or swelling, decrease what you are doing until you are comfortable again and then slowly increase them. If you have problems or questions, call your caregiver or physical therapist for advice.   Rehabilitation is important following a joint replacement. After just a few days of immobilization, the muscles of the leg can become weakened and shrink (atrophy).  These exercises are designed to build up the tone and strength of the thigh and leg muscles and to improve motion. Often times heat used for twenty to thirty minutes before working out will loosen up your tissues and help with improving the range of motion but do not use heat for the first two weeks following surgery (sometimes heat can increase post-operative swelling).   These exercises can be done on a training (exercise) mat, on the floor, on a table or on a bed. Use whatever works the best and is most comfortable for you.  Use music or television while you are exercising so that the exercises are a pleasant break in your day. This will make your life better with the exercises acting as a break in your routine that you can look forward to.   Perform all exercises about fifteen times, three times per day or as directed.  You should exercise both the operative leg and the other leg as well.  Exercises include:   Quad Sets - Tighten up the muscle on the front of the thigh (Quad) and hold for 5-10  seconds.   Straight Leg Raises - With your knee straight (if you were given a brace, keep it on), lift the leg to 60 degrees, hold for 3 seconds, and slowly lower the leg.  Perform this exercise against resistance later as your leg gets stronger.  Leg Slides: Lying on your back, slowly slide your foot toward your buttocks, bending your knee up off the floor (only go as far as is comfortable). Then slowly slide your foot back down until your leg is flat on the floor again.  Angel Wings: Lying on your back spread your legs to the side as far apart as you can without causing discomfort.  Hamstring Strength:  Lying on your back, push your heel against the floor with your leg straight by tightening up the muscles of your buttocks.  Repeat, but this time bend your knee to a comfortable angle, and push your heel against the floor.  You may put a pillow under the heel to make it more comfortable if necessary.   A rehabilitation program following joint replacement surgery can speed recovery and prevent re-injury in the future due to weakened muscles. Contact your doctor or a physical therapist for more information on knee rehabilitation.    CONSTIPATION  Constipation is defined medically as fewer than three stools per week and severe constipation as less than one stool per week.  Even if you have a regular bowel pattern at home, your normal regimen is likely to be disrupted due to multiple reasons following surgery.  Combination of anesthesia, postoperative narcotics, change in appetite and fluid intake all can affect your bowels.   YOU MUST use at least one of the following options; they are listed in order of increasing strength to get the job done.  They are all available over the counter, and you may need to use some, POSSIBLY even all of these options:    Drink plenty of fluids (prune juice may be helpful) and high fiber foods Colace 100 mg by mouth twice a day  Senokot for constipation as directed and  as needed Dulcolax (bisacodyl), take with full glass of water  Miralax (polyethylene glycol) once or twice a day as needed.  If you have tried all these things and are unable to have a bowel movement in the first 3-4 days after surgery call either your surgeon or your primary doctor.    If you experience loose stools or diarrhea, hold the medications until you stool forms back up.  If your symptoms do not get better within 1 week or if they get worse, check with your doctor.  If you experience "the worst abdominal pain ever" or develop nausea or vomiting, please contact the office immediately for further recommendations for treatment.   ITCHING:  If you experience itching with your medications, try taking only a single pain pill, or even half a pain pill at a time.  You can also use Benadryl over the counter  for itching or also to help with sleep.   TED HOSE STOCKINGS:  Use stockings on both legs until for at least 2 weeks or as directed by physician office. They may be removed at night for sleeping.  MEDICATIONS:  See your medication summary on the "After Visit Summary" that nursing will review with you.  You may have some home medications which will be placed on hold until you complete the course of blood thinner medication.  It is important for you to complete the blood thinner medication as prescribed.   Blood clot prevention (DVT Prophylaxis): After surgery you are at an increased risk for a blood clot. you were prescribed a blood thinner, Aspirin 81mg , to be taken twice daily for a total of 4 weeks from surgery to help reduce your risk of getting a blood clot. This will help prevent a blood clot. Signs of a pulmonary embolus (blood clot in the lungs) include sudden short of breath, feeling lightheaded or dizzy, chest pain with a deep breath, rapid pulse rapid breathing. Signs of a blood clot in your arms or legs include new unexplained swelling and cramping, warm, red or darkened skin around the  painful area. Please call the office or 911 right away if these signs or symptoms develop.  PRECAUTIONS:  If you experience chest pain or shortness of breath - call 911 immediately for transfer to the hospital emergency department.   If you develop a fever greater that 101 F, purulent drainage from wound, increased redness or drainage from wound, foul odor from the wound/dressing, or calf pain - CONTACT YOUR SURGEON.                                                   FOLLOW-UP APPOINTMENTS:  If you do not already have a post-op appointment, please call the office for an appointment to be seen by your surgeon.  Guidelines for how soon to be seen are listed in your "After Visit Summary", but are typically between 2-3 weeks after surgery.   POST-OPERATIVE OPIOID TAPER INSTRUCTIONS: It is important to wean off of your opioid medication as soon as possible. If you do not need pain medication after your surgery it is ok to stop day one. Opioids include: Codeine, Hydrocodone(Norco, Vicodin), Oxycodone(Percocet, oxycontin) and hydromorphone amongst others.  Long term and even short term use of opiods can cause: Increased pain response Dependence Constipation Depression Respiratory depression And more.  Withdrawal symptoms can include Flu like symptoms Nausea, vomiting And more Techniques to manage these symptoms Hydrate well Eat regular healthy meals Stay active Use relaxation techniques(deep breathing, meditating, yoga) Do Not substitute Alcohol to help with tapering If you have been on opioids for less than two weeks and do not have pain than it is ok to stop all together.  Plan to wean off of opioids This plan should start within one week post op of your joint replacement. Maintain the same interval or time between taking each dose and first decrease the dose.  Cut the total daily intake of opioids by one tablet each day Next start to increase the time between doses. The last dose that  should be eliminated is the evening dose.   MAKE SURE YOU:  Understand these instructions.  Get help right away if you are not doing well or get worse.  Thank you for letting us be a part of your medical care team.  It is a privilege we respect greatly.  We hope these instructions will help you stay on track for a fast and full recovery!

## 2023-03-25 NOTE — Anesthesia Postprocedure Evaluation (Signed)
Anesthesia Post Note  Patient: Film/video editor  Procedure(s) Performed: TOTAL HIP ARTHROPLASTY (Left: Hip)     Patient location during evaluation: PACU Anesthesia Type: General Level of consciousness: awake and alert Pain management: pain level controlled Vital Signs Assessment: post-procedure vital signs reviewed and stable Respiratory status: spontaneous breathing, nonlabored ventilation and respiratory function stable Cardiovascular status: blood pressure returned to baseline and stable Postop Assessment: no apparent nausea or vomiting Anesthetic complications: no   No notable events documented.  Last Vitals:  Vitals:   03/25/23 1345 03/25/23 1400  BP: (!) 141/78 112/82  Pulse: 91 94  Resp: 16 10  Temp:    SpO2: 100% 100%    Last Pain:  Vitals:   03/25/23 1400  TempSrc:   PainSc: Asleep                 Lowella Curb

## 2023-03-25 NOTE — Progress Notes (Signed)
Patient reports that she had a fall this morning getting on the shower.  Felt a pop in her shoulder.  x-rays in the preoperative area this shows a new scapular body fracture.  Discussed findings with Dr. 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  She may require open reduction for fixation of the scapula fracture.  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  Will plan for CT scan after surgery. admission postoperatively.  Treatment plan for the scapular fracture will depend on CT scan.  Patient expresses understand agreement with this plan.

## 2023-03-25 NOTE — Evaluation (Signed)
Physical Therapy Evaluation Patient Details Name: Shirley Mcdonald MRN: [id number] DOB: [birth year] Today's Date: 03/25/2023  History of Present Illness  Patient is a 72 year old female whom presents to therapy s/p L THA, posterior lateral approach on 03/25/2023 due to failure of conservative measures. Pt is currently L LE WBAT with posterior hip precautions and R UE NWB. Orders for post op R shoulder imaging due to pt sustaining fall in shower this am and felt a pop. CT R shoulder 03/25/2023 is suspicious for acute fractures of the superior right glenoid, lateral right clavicle shaft, and superior aspect of the scapula.Pt recently in hospital, 5/13 after a fall out of her car that resulted in pain in L knee and imaging revealed small joint effusion but was otherwise unremarkable. Pt PMH includes but is not limited to: B TKA, DM II, HTN, R reverse TSA hypothyroidism, OSA and R AKA.  Clinical Impression      Walter Greig is a 72 y.o. female POD 0 s/p L THA. Patient reports nod I at wc level at ILF with mobility at baseline. Patient is now limited by functional impairments (see PT problem list below) and requires Max A for bed mobility and unable to safely assess for transfers and pt non ambulatory x 12 yrs. Patient instructed in exercise to facilitate ROM and circulation to manage edema. Pt in bed when PT arrived. Pt agreeable to therapy intervention, R UE in sling and pt aware of R UE NWB recommendations, pt ed provided on L posterior hip precautions with HO provided and posted. Pt expressed fear of falling and impacting bed mobility tasks at eval. Pt reported during eval need to void bladder, pt required A x 2 for safe use of bed pan with PT focus on rolling tasks to L side with use of L UE at bed rail to engage. PT able to provide nurse and NT ed on pt R UE and LLE restrictions and precautions. Pt left in bed all needs in place. PT recommends continued inpatient follow up therapy, <3 hours/day at time of dc vs ILF  with [facility] services. Patient will benefit from continued skilled PT interventions to address impairments and progress towards PLOF. Acute PT will follow to progress mobility and stair training in preparation for safe discharge to the next venue.      If plan is discharge home, recommend the following: Two people to help with walking and/or transfers;Two people to help with bathing/dressing/bathroom;Assistance with cooking/housework;Assist for transportation;Help with stairs or ramp for entrance   Can travel by private vehicle        Equipment Recommendations None recommended by PT  Recommendations for Other Services  OT consult    Functional Status Assessment Patient has had a recent decline in their functional status and demonstrates the ability to make significant improvements in function in a reasonable and predictable amount of time.     Precautions / Restrictions Precautions Precautions: Posterior Hip;Fall Restrictions Weight Bearing Restrictions: Yes RUE Weight Bearing: Non weight bearing (sling and immoblized at all times) LLE Weight Bearing: Weight bearing as tolerated      Mobility  Bed Mobility Overal bed mobility: Needs Assistance Bed Mobility: Supine to Sit, Sit to Supine, Rolling Rolling: Max assist (to the L side only)   Supine to sit: Max assist, HOB elevated Sit to supine: Max assist, +2 for safety/equipment, +2 for physical assistance   General bed mobility comments: pt expressess fear of falling and is resistive with rolling tasks and scooting to  EOB, pt requires extensive assist at this time for all bed mobility with use of L UE on bed rails    Transfers                   General transfer comment: NT at time of eval    Ambulation/Gait               General Gait Details: NT, pt non ambulatory at baseline  Stairs            Wheelchair Mobility     Tilt Bed    Modified Rankin (Stroke Patients Only)       Balance Overall  balance assessment: Needs assistance, History of Falls (3 falls in past 3 months with self transfering with sliding board) Sitting-balance support:  (L LE and L UE support) Sitting balance-Leahy Scale: Fair                                       Pertinent Vitals/Pain Pain Assessment Pain Assessment: 0-10 Pain Score: 8  (L hip, R UE 4/10) Pain Location: L LE and R UE Pain Descriptors / Indicators: Aching, Constant, Discomfort, Operative site guarding Pain Intervention(s): Limited activity within patient's tolerance, Premedicated before session, Monitored during session, Repositioned, Ice applied    Home Living Family/patient expects to be discharged to:: Private residence Living Arrangements: Alone Available Help at Discharge: Family Type of Home: Independent living facility Home Access: Level entry       Home Layout: One level Home Equipment: Wheelchair - power;Wheelchair - Forensic psychologist (2 wheels);Grab bars - toilet Additional Comments: pt d/c from [facility] 5/30 following fall    Prior Function Prior Level of Function : Independent/Modified Independent             Mobility Comments: She performs stand-pivot transfers into and out of her chair power wheelchair, which she uses for mobility inside her home. sometimes uses slideboard bed <> power chair ADLs Comments: mod I for all ADLs, self care tasks, all 3 meals in dinning room, mod I medicaiton management, transport via facility bus for MD appointments or car transfers with family     Hand Dominance   Dominant Hand: Left    Extremity/Trunk Assessment        Lower Extremity Assessment Lower Extremity Assessment: LLE deficits/detail LLE Deficits / Details: ankle DF 3/5, PF 3+/5 L hip presents with ER with lateral aspect of foot resting on mattress LLE Sensation: WNL    Cervical / Trunk Assessment Cervical / Trunk Assessment: Kyphotic (T 12, L4 and L5 compression fx)  Communication    Communication: No difficulties  Cognition Arousal/Alertness: Awake/alert Behavior During Therapy: WFL for tasks assessed/performed Overall Cognitive Status: Within Functional Limits for tasks assessed                                          General Comments      Exercises Total Joint Exercises Ankle Circles/Pumps: AROM, Left, 10 reps   Assessment/Plan    PT Assessment Patient needs continued PT services  PT Problem List Decreased strength;Decreased range of motion;Decreased activity tolerance;Decreased balance;Decreased mobility;Pain       PT Treatment Interventions DME instruction;Functional mobility training;Therapeutic activities;Therapeutic exercise;Balance training;Neuromuscular re-education;Patient/family education;Modalities    PT Goals (Current goals can be found in the Care  Plan section)  Acute Rehab PT Goals Patient Stated Goal: to be able to stand up and pull my underware on without falling PT Goal Formulation: With patient Time For Goal Achievement: 04/08/23 Potential to Achieve Goals: Good    Frequency 7X/week     Co-evaluation               AM-PAC PT "6 Clicks" Mobility  Outcome Measure Help needed turning from your back to your side while in a flat bed without using bedrails?: A Lot Help needed moving from lying on your back to sitting on the side of a flat bed without using bedrails?: A Lot Help needed moving to and from a bed to a chair (including a wheelchair)?: Total Help needed standing up from a chair using your arms (e.g., wheelchair or bedside chair)?: Total Help needed to walk in hospital room?: Total Help needed climbing 3-5 steps with a railing? : Total 6 Click Score: 8    End of Session   Activity Tolerance: Patient limited by fatigue;Patient limited by pain Patient left: in bed;with call bell/phone within reach Nurse Communication: Mobility status;Need for lift equipment;Precautions;Weight bearing status PT Visit  Diagnosis: Unsteadiness on feet (R26.81);Repeated falls (R29.6);Muscle weakness (generalized) (M62.81);History of falling (Z91.81);Pain Pain - Right/Left: Right Pain - part of body: Shoulder;Arm (L hip and leg)    Time: 1610-9604 PT Time Calculation (min) (ACUTE ONLY): 41 min   Charges:   PT Evaluation $PT Eval Moderate Complexity: 1 Mod PT Treatments $Therapeutic Activity: 23-37 mins PT General Charges $$ ACUTE PT VISIT: 1 Visit         Johnny Bridge, PT Acute Rehab   Jacqualyn Posey 03/25/2023, 7:00 PM

## 2023-03-26 ENCOUNTER — Observation Stay (HOSPITAL_COMMUNITY): Payer: Medicare Other

## 2023-03-26 ENCOUNTER — Encounter (HOSPITAL_COMMUNITY): Payer: Self-pay | Admitting: Orthopedic Surgery

## 2023-03-26 DIAGNOSIS — M1612 Unilateral primary osteoarthritis, left hip: Secondary | ICD-10-CM | POA: Diagnosis not present

## 2023-03-26 LAB — GLUCOSE, CAPILLARY
Glucose-Capillary: 150 mg/dL — ABNORMAL HIGH (ref 70–99)
Glucose-Capillary: 245 mg/dL — ABNORMAL HIGH (ref 70–99)
Glucose-Capillary: 133 mg/dL — ABNORMAL HIGH (ref 70–99)
Glucose-Capillary: 152 mg/dL — ABNORMAL HIGH (ref 70–99)

## 2023-03-26 MED ORDER — ACETAMINOPHEN 325 MG PO TABS
325.0000 mg | ORAL_TABLET | Freq: Four times a day (QID) | ORAL | Status: DC | PRN
  Administered 2023-03-27: 325 mg via ORAL
  Filled 2023-03-26: qty 1

## 2023-03-26 MED ORDER — HYDROCODONE-ACETAMINOPHEN 10-325 MG PO TABS
0.5000 | ORAL_TABLET | ORAL | Status: DC | PRN
  Administered 2023-03-26: 0.5 via ORAL
  Administered 2023-03-26 – 2023-03-28 (×7): 1 via ORAL
  Filled 2023-03-26 (×8): qty 1

## 2023-03-26 NOTE — Progress Notes (Signed)
Reviewed R shoulder imaging with Dr. Everardo Pacific. Overall good alignment and can heal with nonop treatment if remains nondisplaced. However needs to stay strict NWB in sling. Plan to recheck an Xray in 1 week.

## 2023-03-26 NOTE — Progress Notes (Signed)
   03/26/23 2230  BiPAP/CPAP/SIPAP  BiPAP/CPAP/SIPAP Pt Type Adult  BiPAP/CPAP/SIPAP DREAMSTATIOND  Mask Type Full face mask (home mask)  Mask Size Small  FiO2 (%) 21 %  Patient Home Equipment No  Auto Titrate Yes

## 2023-03-26 NOTE — Progress Notes (Signed)
     Subjective:  Patient reports pain as moderate.  Worked with PT yesterday. CT scan R shoulder obtained last night, will review with Dr. Everardo Pacific this AM.  Objective:   VITALS:   Vitals:   03/25/23 1732 03/25/23 2012 03/26/23 0121 03/26/23 0516  BP: (!) 147/78 131/85 107/72 122/65  Pulse: 98 95 85 78  Resp: 20 16 17 17   Temp: 98 F (36.7 C) 98.4 F (36.9 C) 98 F (36.7 C) 98.2 F (36.8 C)  TempSrc: Oral Oral Oral   SpO2: 99% 97% 96% 98%  Weight:      Height:        Sensation intact distally Intact pulses distally Dorsiflexion/Plantar flexion intact Incision: dressing C/D/I Compartment soft    Lab Results  Component Value Date   WBC 7.0 03/26/2023   HGB 9.5 (L) 03/26/2023   HCT 26.7 (L) 03/26/2023   MCV 83.4 03/26/2023   PLT 145 (L) 03/26/2023   BMET    Component Value Date/Time   NA 132 (L) 03/26/2023 0328   K 3.7 03/26/2023 0328   CL 99 03/26/2023 0328   CO2 24 03/26/2023 0328   GLUCOSE 163 (H) 03/26/2023 0328   BUN 8 03/26/2023 0328   CREATININE 0.45 03/26/2023 0328   CALCIUM 8.5 (L) 03/26/2023 0328   GFRNONAA >60 03/26/2023 0328      Xray: THA components in good position. No adverse features.  Assessment/Plan: 1 Day Post-Op   Principal Problem:   Chronic left hip pain  S/p L THA for L hip OA. Also with R scapular spine fx: plan TBD  Post op recs: WB: WBAT LLE, No formal hip precautions, nonweightbearing right upper extremity for scapular body fracture Abx: ancef Imaging: PACU pelvis Xray, will get a right shoulder x-ray this evening to determine further management of the scapula fracture Dressing: Aquacell, keep intact until follow up DVT prophylaxis: Aspirin 81BID starting POD1 Follow up: 2 weeks after surgery for a wound check with Dr. Blanchie Dessert at [facility].  Address: [address], [place], [place] [address]  Office Phone: [phone]    Joen Laura 03/26/2023, 6:41 AM   Weber Cooks,  MD  Contact information:   [phone] 7am-5pm epic message Dr. Blanchie Dessert, or call office for patient follow up: [phone] After hours and holidays please check [email or web address] for group call information for [facility]

## 2023-03-26 NOTE — TOC Initial Note (Signed)
Transition of Care [facility]) - Initial/Assessment Note   Patient Details  Name: Shirley Mcdonald MRN: [id number] Date of Birth: 1951-05-10  [facility]) CM/SW Contact:    Ewing Schlein, LCSW Phone Number: 03/26/2023, 10:22 AM  Clinical Narrative: Patient will need SNF after discharge and is agreeable to being faxed out. FL2 done; PASRR confirmed. Initial referral faxed out. TOC awaiting bed offers.                 Expected Discharge Plan: Skilled Nursing Facility Barriers to Discharge: Continued Medical Work up  Patient Goals and CMS Choice Patient states their goals for this hospitalization and ongoing recovery are:: Go to rehab before returning home CMS Medicare.gov Compare Post Acute Care list provided to:: Patient Choice offered to / list presented to : Patient  Expected Discharge Plan and Services In-house Referral: Clinical Social Work Post Acute Care Choice: Skilled Nursing Facility Living arrangements for the past 2 months: Apartment           DME Arranged: N/A DME Agency: NA  Prior Living Arrangements/Services Living arrangements for the past 2 months: Apartment Lives with:: Self Patient language and need for interpreter reviewed:: Yes Do you feel safe going back to the place where you live?: Yes      Need for Family Participation in Patient Care: Yes (Comment) Care giver support system in place?: Yes (comment) Criminal Activity/Legal Involvement Pertinent to Current Situation/Hospitalization: No - Comment as needed  Activities of Daily Living Home Assistive Devices/Equipment: Wheelchair ADL Screening (condition at time of admission) Patient's cognitive ability adequate to safely complete daily activities?: Yes Is the patient deaf or have difficulty hearing?: No Does the patient have difficulty seeing, even when wearing glasses/contacts?: No Does the patient have difficulty concentrating, remembering, or making decisions?: No Patient able to express need for  assistance with ADLs?: Yes Does the patient have difficulty dressing or bathing?: No Independently performs ADLs?: Yes (appropriate for developmental age) Does the patient have difficulty walking or climbing stairs?: No Weakness of Legs: Left Weakness of Arms/Hands: None  Permission Sought/Granted Permission sought to share information with : Facility Industrial/product designer granted to share information with : Yes, Verbal Permission Granted Permission granted to share info w AGENCY: SNFs  Emotional Assessment Appearance:: Appears stated age Attitude/Demeanor/Rapport: Engaged Affect (typically observed): Accepting Orientation: : Oriented to Self, Oriented to Place, Oriented to  Time, Oriented to Situation Alcohol / Substance Use: Not Applicable Psych Involvement: No (comment)  Admission diagnosis:  Chronic left hip pain [M25.552, G89.29] Patient Active Problem List   Diagnosis Date Noted   Chronic left hip pain 03/25/2023   Status post total shoulder arthroplasty, right 07/18/2022   Obstructive sleep apnea 02/25/2020   Closed fracture of left proximal humerus 02/11/2019   PCP:  Eartha Inch, MD Pharmacy:   [facility] [id number] - [place] - [address] [place] [address] Phone: [phone] Fax: [phone]  Social Determinants of Health (SDOH) Social History: SDOH Screenings   Food Insecurity: No Food Insecurity (03/25/2023)  Housing: Low Risk  (03/25/2023)  Transportation Needs: No Transportation Needs (03/25/2023)  Utilities: Not At Risk (03/25/2023)  Financial Resource Strain: Low Risk  (01/23/2023)   Received from [facility]  Physical Activity: Unknown (01/23/2023)   Received from [facility]  Social Connections: Socially Integrated (01/23/2023)   Received from [facility]  Stress: No Stress Concern Present (02/05/2023)   Received from [facility]  Tobacco Use: Medium Risk (03/25/2023)   SDOH  Interventions:    Readmission Risk Interventions     No data to display

## 2023-03-26 NOTE — Care Management Obs Status (Signed)
MEDICARE OBSERVATION STATUS NOTIFICATION   Patient Details  Name: Shirley Mcdonald MRN: [id number] Date of Birth: Aug 01, 1951   Medicare Observation Status Notification Given:  Yes    Ewing Schlein, LCSW 03/26/2023, 11:49 AM

## 2023-03-26 NOTE — Progress Notes (Signed)
Physical Therapy Treatment Patient Details Name: Shirley Mcdonald MRN: [id number] DOB: 01-14-1951 Today's Date: 03/26/2023   History of Present Illness Patient is a 72 year old female whom presents to therapy s/p L THA, posterior lateral approach on 03/25/2023 due to failure of conservative measures. Pt is currently L LE WBAT with posterior hip precautions and R UE NWB. Orders for post op R shoulder imaging due to pt sustaining fall in shower this am and felt a pop. CT R shoulder 03/25/2023 is suspicious for acute fractures of the superior right glenoid, lateral right clavicle shaft, and superior aspect of the scapula.Pt recently in hospital, 5/13 after a fall out of her car that resulted in pain in L knee and imaging revealed small joint effusion but was otherwise unremarkable. Pt PMH includes but is not limited to: B TKA, DM II, HTN, R reverse TSA hypothyroidism, OSA and R AKA.    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 Attempted sitting EOB was unsuccessful. General bed mobility comments: attempted rolling to sidelying to EOB however pt unable to achieve fully due to increased pain L hip nad HIGH anxiety/fear of falling.  With strict NWB R UE currently in a sling, pt was unable to achieve full upright seated position.  Assisted back to supine was just as difficult requiring + 2 to scoot to [facility]. Pt will need ST Rehab at SNF to address mobility and functional decline prior to safely returning home.    If plan is discharge home, recommend the following: Two people to help with walking and/or transfers;Two people to help with bathing/dressing/bathroom;Assistance with cooking/housework;Assist for transportation;Help with stairs or ramp for entrance   Can travel by private vehicle        Equipment Recommendations  None recommended by PT    Recommendations for Other Services       Precautions /  Restrictions Precautions Precautions: Posterior Hip;Fall Precaution Comments: R AKA 2012 Restrictions Weight Bearing Restrictions: Yes RUE Weight Bearing: Non weight bearing LLE Weight Bearing: Weight bearing as tolerated     Mobility  Bed Mobility Overal bed mobility: Needs Assistance Bed Mobility: Supine to Sit, Sit to Supine, Rolling Rolling: Max assist, +2 for physical assistance, +2 for safety/equipment   Supine to sit: Max assist, +2 for physical assistance, +2 for safety/equipment Sit to supine: Max assist, +2 for physical assistance, Modified independent (Device/Increase time)   General bed mobility comments: attempted rolling to sidelying to EOB however pt unable to achieve fully due to increased pain L hip nad HIGH anxiety/fear of falling.  With strict NWB R UE currently in a sling, pt was unable to achieve full upright seated position.  Assisted back to supine was just as difficult requiring + 2 to scoot to [facility].    Transfers                   General transfer comment: unable to attempt due to inability to sit EOB.  Rec Maxi Move LIFT.    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        Exercises      General Comments        Pertinent Vitals/Pain Pain Assessment Pain Assessment: Faces Faces Pain Scale: Hurts even more Pain Location: L LE and R UE Pain Descriptors / Indicators:  Aching, Constant, Discomfort, Operative site guarding Pain Intervention(s): Monitored during session, Premedicated before session, Repositioned, Ice applied    Home Living                          Prior Function            PT Goals (current goals can now be found in the care plan section) Progress towards PT goals: Progressing toward goals    Frequency    7X/week      PT Plan Current plan remains appropriate    Co-evaluation              AM-PAC PT "6 Clicks" Mobility   Outcome Measure  Help needed turning from your back to your side while in a flat bed without using bedrails?: A Lot Help needed moving from lying on your back to sitting on the side of a flat bed without using bedrails?: Total Help needed moving to and from a bed to a chair (including a wheelchair)?: Total Help needed standing up from a chair using your arms (e.g., wheelchair or bedside chair)?: Total Help needed to walk in hospital room?: Total Help needed climbing 3-5 steps with a railing? : Total 6 Click Score: 7    End of Session Equipment Utilized During Treatment: Gait belt Activity Tolerance: Patient limited by fatigue;Other (comment) (anxiety) Patient left: in bed;with call bell/phone within reach Nurse Communication: Mobility status;Need for lift equipment;Precautions;Weight bearing status Pain - Right/Left: Right Pain - part of body: Shoulder;Arm     Time: 7829-5621 PT Time Calculation (min) (ACUTE ONLY): 20 min  Charges:    $Therapeutic Activity: 8-22 mins PT General Charges $$ ACUTE PT VISIT: 1 Visit                    Felecia Shelling  PTA Acute  Rehabilitation Services Office M-F          [phone]

## 2023-03-26 NOTE — [place] FL2 (Signed)
[place] MEDICA[place] FL2 LEVEL OF CARE FORM     IDENTIFICATION  Patient Name: Shirley Mcdonald Birthdate: 1951-08-15 Sex: female Admission Date (Current Location): 03/25/2023  [facility] and [place] Number:  Producer, television/film/video and Address:  [facility],  [address], [place] [address]      Provider Number: [id number]  Attending Physician Name and Address:  Joen Laura, MD  Relative Name and Phone Number:  Precious Reel (daughter) Ph: [phone]    Current Level of Care: Hospital Recommended Level of Care: Skilled Nursing Facility Prior Approval Number:    Date Approved/Denied:   PASRR Number: [id number]  Discharge Plan: SNF    Current Diagnoses: Patient Active Problem List   Diagnosis Date Noted   Chronic left hip pain 03/25/2023   Status post total shoulder arthroplasty, right 07/18/2022   Obstructive sleep apnea 02/25/2020   Closed fracture of left proximal humerus 02/11/2019    Orientation RESPIRATION BLADDER Height & Weight     Self, Time, Situation, Place  Normal Continent Weight: 184 lb (83.5 kg) Height:  5\' 2"  (157.5 cm)  BEHAVIORAL SYMPTOMS/MOOD NEUROLOGICAL BOWEL NUTRITION STATUS      Continent Diet (Carb modified)  AMBULATORY STATUS COMMUNICATION OF NEEDS Skin   Extensive Assist Verbally Surgical wounds                       Personal Care Assistance Level of Assistance  Bathing, Feeding, Dressing Bathing Assistance: Maximum assistance Feeding assistance: Limited assistance Dressing Assistance: Maximum assistance     Functional Limitations Info  Sight, Hearing, Speech   Hearing Info: Adequate Speech Info: Adequate    SPECIAL CARE FACTORS FREQUENCY  PT (By licensed PT), OT (By licensed OT)     PT Frequency: 5x's/week OT Frequency: 5x's/week            Contractures Contractures Info: Not present    Additional Factors Info  Code Status, Psychotropic, Insulin Sliding Scale, Allergies Code Status Info:  Full Allergies Info: Hydromorphone Hcl, Hydrocodone-acetaminophen, Hydromorphone, Lotrel (Amlodipine Besy-benazepril Hcl), Oxycontin (Oxycodone Hcl), Saccharin, Tape, Adhesive (Tape), Amlodipine Besy-benazepril Hcl Psychotropic Info: Wellbutrin, Lamictal, Desyrel Insulin Sliding Scale Info: See discharge summary       Current Medications (03/26/2023):  This is the current hospital active medication list Current Facility-Administered Medications  Medication Dose Route Frequency Provider Last Rate Last Admin   acetaminophen (TYLENOL) tablet 1,000 mg  1,000 mg Oral Q6H Kathie Dike M, PA-C   1,000 mg at 03/26/23 0005   acetaminophen (TYLENOL) tablet 325-650 mg  325-650 mg Oral Q6H PRN Kathie Dike M, PA-C       amLODipine (NORVASC) tablet 2.5 mg  2.5 mg Oral Daily Kathie Dike M, PA-C       aspirin chewable tablet 81 mg  81 mg Oral BID Kathie Dike M, PA-C   81 mg at 03/25/23 2201   atorvastatin (LIPITOR) tablet 20 mg  20 mg Oral QPM Kathie Dike M, PA-C       buPROPion (WELLBUTRIN XL) 24 hr tablet 300 mg  300 mg Oral Daily Kathie Dike M, PA-C       ceFAZolin (ANCEF) IVPB 2g/100 mL premix  2 g Intravenous Q6H Cecil Cobbs, PA-C 200 mL/hr at 03/26/23 0547 2 g at 03/26/23 0547   diphenhydrAMINE (BENADRYL) 12.5 MG/5ML elixir 12.5-25 mg  12.5-25 mg Oral Q4H PRN Kathie Dike M, PA-C       docusate sodium (COLACE) capsule 100 mg  100 mg Oral BID Jennye Moccasin,  Lennox Pippins, PA-C       fluticasone (FLONASE) 50 MCG/ACT nasal spray 2 spray  2 spray Each Nare Daily PRN Kathie Dike M, PA-C       HYDROcodone-acetaminophen (NORCO/VICODIN) 5-325 MG per tablet 1 tablet  1 tablet Oral Q4H PRN Cecil Cobbs, PA-C   1 tablet at 03/26/23 0406   insulin aspart (novoLOG) injection 0-15 Units  0-15 Units Subcutaneous TID WC Cecil Cobbs, PA-C   2 Units at 03/26/23 9604   insulin glargine-yfgn [facility]) injection 17 Units  17 Units Subcutaneous QHS Cecil Cobbs,  [place]   17 Units at 03/25/23 2203   irbesartan (AVAPRO) tablet 150 mg  150 mg Oral Daily Kathie Dike M, PA-C       ketorolac (TORADOL) 15 MG/ML injection 7.5 mg  7.5 mg Intravenous Q6H Kathie Dike M, PA-C   7.5 mg at 03/26/23 5409   lactated ringers infusion   Intravenous Continuous Cecil Cobbs, PA-C 75 mL/hr at 03/25/23 1802 New Bag at 03/25/23 1802   lamoTRIgine (LAMICTAL) tablet 200 mg  200 mg Oral QPM Kathie Dike M, PA-C   200 mg at 03/25/23 2201   loratadine (CLARITIN) tablet 10 mg  10 mg Oral Daily Kathie Dike M, PA-C       menthol-cetylpyridinium (CEPACOL) lozenge 3 mg  1 lozenge Oral PRN Kathie Dike M, PA-C       Or   phenol (CHLORASEPTIC) mouth spray 1 spray  1 spray Mouth/Throat PRN Kathie Dike M, PA-C       metFORMIN (GLUCOPHAGE-XR) 24 hr tablet 500 mg  500 mg Oral BID WC Kathie Dike M, PA-C   500 mg at 03/26/23 8119   methocarbamol (ROBAXIN) tablet 500 mg  500 mg Oral Q6H PRN Kathie Dike M, PA-C       Or   methocarbamol (ROBAXIN) 500 mg in dextrose 5 % 50 mL IVPB  500 mg Intravenous Q6H PRN Cecil Cobbs, PA-C   Stopped at 03/25/23 1451   metoprolol succinate (TOPROL-XL) 24 hr tablet 25 mg  25 mg Oral Daily Kathie Dike M, PA-C       nortriptyline (PAMELOR) capsule 75 mg  75 mg Oral QHS Kathie Dike M, PA-C   75 mg at 03/26/23 0006   ondansetron (ZOFRAN) tablet 4 mg  4 mg Oral Q6H PRN Kathie Dike M, PA-C       Or   ondansetron [facility]) injection 4 mg  4 mg Intravenous Q6H PRN Kathie Dike M, PA-C       pantoprazole (PROTONIX) EC tablet 40 mg  40 mg Oral Daily Kathie Dike M, PA-C   40 mg at 03/25/23 2201   polyethylene glycol (MIRALAX / GLYCOLAX) packet 17 g  17 g Oral Daily PRN Kathie Dike M, PA-C       senna (SENOKOT) tablet 8.6 mg  1 tablet Oral BID Kathie Dike M, PA-C       traZODone (DESYREL) tablet 100 mg  100 mg Oral QHS Kathie Dike M, PA-C   100 mg at 03/25/23 2201      Discharge Medications: Please see discharge summary for a list of discharge medications.  Relevant Imaging Results:  Relevant Lab Results:   Additional Information SSN: [id number]  Ewing Schlein, LCSW

## 2023-03-27 DIAGNOSIS — M1612 Unilateral primary osteoarthritis, left hip: Secondary | ICD-10-CM | POA: Diagnosis not present

## 2023-03-27 LAB — GLUCOSE, CAPILLARY
Glucose-Capillary: 176 mg/dL — ABNORMAL HIGH (ref 70–99)
Glucose-Capillary: 199 mg/dL — ABNORMAL HIGH (ref 70–99)
Glucose-Capillary: 149 mg/dL — ABNORMAL HIGH (ref 70–99)
Glucose-Capillary: 190 mg/dL — ABNORMAL HIGH (ref 70–99)

## 2023-03-27 LAB — CBC
HCT: 26.5 % — ABNORMAL LOW (ref 36.0–46.0)
Hemoglobin: 9.1 g/dL — ABNORMAL LOW (ref 12.0–15.0)
MCH: 29 pg (ref 26.0–34.0)
MCHC: 34.3 g/dL (ref 30.0–36.0)
MCV: 84.4 fL (ref 80.0–100.0)
Platelets: 142 10*3/uL — ABNORMAL LOW (ref 150–400)
RBC: 3.14 MIL/uL — ABNORMAL LOW (ref 3.87–5.11)
RDW: 16.3 % — ABNORMAL HIGH (ref 11.5–15.5)
WBC: 6.6 10*3/uL (ref 4.0–10.5)
nRBC: 0 % (ref 0.0–0.2)

## 2023-03-27 MED ORDER — OXYCODONE HCL 5 MG PO TABS: 5.0000 mg | ORAL_TABLET | ORAL | 0 refills | Status: DC | PRN

## 2023-03-27 MED ORDER — COLCHICINE 0.6 MG PO TABS
0.6000 mg | ORAL_TABLET | Freq: Two times a day (BID) | ORAL | Status: DC
  Administered 2023-03-27 – 2023-03-28 (×3): 0.6 mg via ORAL
  Filled 2023-03-27 (×3): qty 1

## 2023-03-27 MED ORDER — ACETAMINOPHEN 500 MG PO TABS: 500.0000 mg | ORAL_TABLET | Freq: Three times a day (TID) | ORAL | Status: AC | PRN

## 2023-03-27 MED ORDER — ONDANSETRON HCL 4 MG PO TABS: 4.0000 mg | ORAL_TABLET | Freq: Three times a day (TID) | ORAL | 0 refills | Status: AC | PRN

## 2023-03-27 MED ORDER — ASPIRIN 81 MG PO TBEC: 81.0000 mg | DELAYED_RELEASE_TABLET | Freq: Two times a day (BID) | ORAL | 0 refills | Status: AC

## 2023-03-27 MED ORDER — POLYETHYLENE GLYCOL 3350 17 G PO PACK: 17.0000 g | PACK | Freq: Every day | ORAL | 0 refills | Status: DC

## 2023-03-27 MED ORDER — CEFADROXIL 500 MG PO CAPS: 500.0000 mg | ORAL_CAPSULE | Freq: Two times a day (BID) | ORAL | 0 refills | Status: AC

## 2023-03-27 MED ORDER — METHOCARBAMOL 500 MG PO TABS: 500.0000 mg | ORAL_TABLET | Freq: Three times a day (TID) | ORAL | 0 refills | Status: AC | PRN

## 2023-03-27 NOTE — Progress Notes (Signed)
   03/27/23 2256  BiPAP/CPAP/SIPAP  BiPAP/CPAP/SIPAP Pt Type Adult  BiPAP/CPAP/SIPAP DREAMSTATIOND  Mask Type Full face mask (home mask)  EPAP 11 cmH2O  FiO2 (%) 21 %  Patient Home Equipment No  Auto Titrate  (home mask)

## 2023-03-27 NOTE — Plan of Care (Signed)

## 2023-03-27 NOTE — Progress Notes (Signed)
Physical Therapy Treatment Patient Details Name: Shirley Mcdonald MRN: [id number] DOB: June 11, 1951 Today's Date: 03/27/2023   History of Present Illness Patient is a 72 year old female whom presents to therapy s/p L THA, posterior lateral approach on 03/25/2023 due to failure of conservative measures. Pt is currently L LE WBAT with posterior hip precautions and R UE NWB. Orders for post op R shoulder imaging due to pt sustaining fall in shower this am and felt a pop. CT R shoulder 03/25/2023 is suspicious for acute fractures of the superior right glenoid, lateral right clavicle shaft, and superior aspect of the scapula.Pt recently in hospital, 5/13 after a fall out of her car that resulted in pain in L knee and imaging revealed small joint effusion but was otherwise unremarkable. Pt PMH includes but is not limited to: B TKA, DM II, HTN, R reverse TSA hypothyroidism, OSA and R AKA.    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 Pt stated she has been using a sliding board "past 2 months" and has not been able to "stand on that leg". Pt was OOB in recliner via MAXI Move ny nursing.  Performed a few seated TE's of L LE LAQ's and AP as well as truck flexion/forward leaning but not past 90 degrees.  Then used Maxi Move to assist pt from recliner to [facility] for an attempted void.  Pt sat BSC > 10 min but was unable to void despite c/o "feels like I am about to bust" (bladder).  Assisted back to recliner via Maxi Move and positioned to comfort.   Pt will need ST Rehab at SNF to address mobility and functional decline prior to safely returning home.    If plan is discharge home, recommend the following: Two people to help with walking and/or transfers;Two people to help with bathing/dressing/bathroom;Assistance with cooking/housework;Assist for transportation;Help with stairs or ramp for entrance   Can travel by private vehicle         Equipment Recommendations  None recommended by PT    Recommendations for Other Services       Precautions / Restrictions Precautions Precautions: Posterior Hip;Fall Precaution Comments: R AKA 2012 Restrictions Weight Bearing Restrictions: Yes RUE Weight Bearing: Non weight bearing LLE Weight Bearing: Weight bearing as tolerated     Mobility  Bed Mobility               General bed mobility comments: OOB in recliner via LIFT by nursing    Transfers Overall transfer level: Needs assistance                 General transfer comment: used MAXI move to transfer pt from recliner to [facility] then back to recliner for attempted void however pt was unable despite c/o "feels like I am going to bust" (bladder). 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 Pt stated she has been using a sliding board "past 2 months" and has not been able to "stand on that leg".        Exercises      General Comments        Pertinent Vitals/Pain Pain Assessment Pain Assessment: Faces Faces Pain Scale: Hurts little more Pain Location: R UE during Maxi Move transfer Pain Descriptors / Indicators: Aching, Discomfort Pain Intervention(s): Monitored during session    Home Living                          Prior Function            PT Goals (current goals can now be found in the care plan section) Progress towards PT goals: Progressing toward  goals    Frequency    7X/week      PT Plan Current plan remains appropriate    Co-evaluation              AM-PAC PT "6 Clicks" Mobility   Outcome Measure  Help needed turning from your back to your side while in a flat bed without using bedrails?: A Lot Help needed moving from lying on your back to sitting on the side of a flat bed without using bedrails?: Total Help needed moving to and from a bed to a chair (including a wheelchair)?: Total Help needed standing up from a chair using your arms (e.g., wheelchair or bedside chair)?: Total Help needed to walk in hospital room?: Total Help needed climbing 3-5 steps with a railing? : Total 6 Click Score: 7    End of Session Equipment Utilized During Treatment: Gait belt Activity Tolerance: Patient limited by fatigue Patient left: in chair;with call bell/phone within reach Nurse Communication: Mobility status;Need for lift equipment;Precautions PT Visit Diagnosis: Unsteadiness on feet (R26.81);Repeated falls (R29.6);Muscle weakness (generalized) (M62.81);History of falling (Z91.81);Pain Pain - Right/Left: Right Pain - part of body: Shoulder;Arm     Time: 1610-9604 PT Time Calculation (min) (ACUTE ONLY): 24 min  Charges:    $Therapeutic Activity: 23-37 mins PT General Charges $$ ACUTE PT VISIT: 1 Visit                     Felecia Shelling  PTA Acute  Rehabilitation Services Office M-F          [phone]

## 2023-03-27 NOTE — Progress Notes (Signed)
    2 Days Post-Op Procedure(s) (LRB): TOTAL HIP ARTHROPLASTY (Left)  Subjective:  Patient reports pain as moderate, slept okay.  Just finished breakfast.  Worked with PT yesterday, some reported difficulty due to pain and anxiousness. CT scan R shoulder yesterday reviewed by Dr. Blanchie Dessert with Dr. Everardo Pacific -- plan to treat nonoperatively.  Discussed scapular fracture treatment with patient.  Strict NWB of RUE.  Anticipate SNF upon DC.  Objective:   VITALS:   Vitals:   03/26/23 0953 03/26/23 1345 03/26/23 2118 03/27/23 0634  BP: 114/66 (!) 104/56 117/68 117/71  Pulse: 98 94 98 78  Resp: 16 16 16 18   Temp: 98.6 F (37 C) 98.6 F (37 C) 97.9 F (36.6 C) 98.8 F (37.1 C)  TempSrc: Oral Oral  Oral  SpO2: 98% 99% 96% (!) 80%  Weight:      Height:        AAOx4, sitting comfortably Sensation intact distally Intact pulses distally, good perfusion BUE/BLE Dorsiflexion/Plantar flexion intact Incision: dressing C/D/I Compartment soft Wiggles toes and fingers appropriately   Lab Results  Component Value Date   WBC 7.0 03/26/2023   HGB 9.5 (L) 03/26/2023   HCT 26.7 (L) 03/26/2023   MCV 83.4 03/26/2023   PLT 145 (L) 03/26/2023   BMET    Component Value Date/Time   NA 132 (L) 03/26/2023 0328   K 3.7 03/26/2023 0328   CL 99 03/26/2023 0328   CO2 24 03/26/2023 0328   GLUCOSE 163 (H) 03/26/2023 0328   BUN 8 03/26/2023 0328   CREATININE 0.45 03/26/2023 0328   CALCIUM 8.5 (L) 03/26/2023 0328   GFRNONAA >60 03/26/2023 0328     Xray: THA components in good position. No adverse features.  Assessment/Plan: 2 Days Post-Op   Principal Problem:   Chronic left hip pain  S/p L THA for L hip OA Also with R scapular spine fx: plan to treat non-operatively with NWB/sling and repeat XR 1 week from injury  Post op recs: WB: WBAT LLE, No formal hip precautions, nonweightbearing right upper extremity for scapular body fracture Abx: ancef, DC with Cefadroxil x 7 days Imaging:  PACU pelvis Xray, CT R shoulder, repeat shoulder Xray 1 week post injury Dressing: Aquacell, keep intact until follow up DVT prophylaxis: Aspirin 81BID starting POD1 DC: anticipate SNF placement  Follow up: 2 weeks after surgery for a wound check with Dr. Blanchie Dessert at [facility].  Address: [address], [place], [place] [address]  Office Phone: [phone]    Shirley Mcdonald 03/27/2023, 7:52 AM   Weber Cooks, MD  Contact information:   [phone] 7am-5pm epic message Dr. Blanchie Dessert, or call office for patient follow up: [phone] After hours and holidays please check [email or web address] for group call information for [facility]

## 2023-03-27 NOTE — TOC Progression Note (Signed)
Transition of Care [facility]) - Progression Note   Patient Details  Name: Shirley Mcdonald MRN: [id number] Date of Birth: 19-Feb-1951  Transition of Care [facility]) CM/SW Contact  Ewing Schlein, LCSW Phone Number: 03/27/2023, 3:27 PM  Clinical Narrative: CSW provided patient with SNF bed offers with Medicare star ratings. [facility] chose [facility]. CSW confirmed bed will be available tomorrow with [facility] in admissions. CSW started insurance authorization on [employer] portal, which is pending. Reference ID # is: [id number]. TOC awaiting insurance authorization.   Expected Discharge Plan: Skilled Nursing Facility Barriers to Discharge: Insurance Authorization  Expected Discharge Plan and Services In-house Referral: Clinical Social Work Post Acute Care Choice: Skilled Nursing Facility Living arrangements for the past 2 months: Apartment             DME Arranged: N/A DME Agency: NA  Social Determinants of Health (SDOH) Interventions SDOH Screenings   Food Insecurity: No Food Insecurity (03/25/2023)  Housing: Low Risk  (03/25/2023)  Transportation Needs: No Transportation Needs (03/25/2023)  Utilities: Not At Risk (03/25/2023)  Financial Resource Strain: Low Risk  (01/23/2023)   Received from [facility]  Physical Activity: Unknown (01/23/2023)   Received from [facility]  Social Connections: Socially Integrated (01/23/2023)   Received from [facility]  Stress: No Stress Concern Present (02/05/2023)   Received from [facility]  Tobacco Use: Medium Risk (03/25/2023)   Readmission Risk Interventions     No data to display

## 2023-03-27 NOTE — Inpatient Diabetes Management (Signed)
Inpatient Diabetes Program Recommendations  AACE/ADA: New Consensus Statement on Inpatient Glycemic Control (2015)  Target Ranges:  Prepandial:   less than 140 mg/dL      Peak postprandial:   less than 180 mg/dL (1-2 hours)      Critically ill patients:  140 - 180 mg/dL   Lab Results  Component Value Date   GLUCAP 176 (H) 03/27/2023   HGBA1C 4.5 (L) 03/12/2023    Review of Glycemic Control  Diabetes history: DM2 Outpatient Diabetes medications: Humalog 2-6 TID, Lantus 17 units at bedtime (stopped taking per PCP) Current orders for Inpatient glycemic control: Lantus 17 at bedtime, Novolog 0-15 TID  HgbA1C - 4.5% (very low) Also H/H low  Inpatient Diabetes Program Recommendations:    Discharge Recommendations: Other recommendations: Dexcom G7 CGM prescription Short acting recommendations:  Correction coverage ONLY Insulin lispro (HUMALOG) KwikPen  Sensitive Scale.   Supply/Referral recommendations: Pen needles - standard   Use Adult Diabetes Insulin Treatment Post Discharge order set.  Will need to f/u with blood sugar log with PCP for any needed changes in her insulin regimen. Goal is to avoid hypoglycemia.  Will speak with pt this morning regarding hypoglycemia s/s and treatment and importance of f/u with PCP.  Thank you. Ailene Ards, RD, LDN, CDCES Inpatient Diabetes Coordinator [phone]

## 2023-03-28 DIAGNOSIS — M1612 Unilateral primary osteoarthritis, left hip: Secondary | ICD-10-CM | POA: Diagnosis not present

## 2023-03-28 LAB — GLUCOSE, CAPILLARY
Glucose-Capillary: 148 mg/dL — ABNORMAL HIGH (ref 70–99)
Glucose-Capillary: 163 mg/dL — ABNORMAL HIGH (ref 70–99)

## 2023-03-28 MED ORDER — HYDROCODONE-ACETAMINOPHEN 10-325 MG PO TABS: 0.5000 | ORAL_TABLET | ORAL | 0 refills | Status: DC | PRN

## 2023-03-28 NOTE — Progress Notes (Signed)
    3 Days Post-Op Procedure(s) (LRB): TOTAL HIP ARTHROPLASTY (Left)  Subjective:  Patient reports pain as moderate, slept okay.  Worked with PT. Mobility is limited by R shoulder fx and R leg amputation. Plan for DC to SNF today.  Objective:   VITALS:   Vitals:   03/27/23 0634 03/27/23 1413 03/27/23 2143 03/28/23 0617  BP: 117/71 101/64 106/60 115/74  Pulse: 78 98 92 85  Resp: 18 15 16 17   Temp: 98.8 F (37.1 C) 98.3 F (36.8 C) 98.7 F (37.1 C) 98.1 F (36.7 C)  TempSrc: Oral Oral Oral Oral  SpO2: (!) 80% 94% 93% 94%  Weight:      Height:        AAOx4, sitting comfortably Sensation intact distally Intact pulses distally, good perfusion BUE/BLE Dorsiflexion/Plantar flexion intact Incision: dressing C/D/I Compartment soft Wiggles toes and fingers appropriately   Lab Results  Component Value Date   WBC 6.6 03/27/2023   HGB 9.1 (L) 03/27/2023   HCT 26.5 (L) 03/27/2023   MCV 84.4 03/27/2023   PLT 142 (L) 03/27/2023   BMET    Component Value Date/Time   NA 132 (L) 03/26/2023 0328   K 3.7 03/26/2023 0328   CL 99 03/26/2023 0328   CO2 24 03/26/2023 0328   GLUCOSE 163 (H) 03/26/2023 0328   BUN 8 03/26/2023 0328   CREATININE 0.45 03/26/2023 0328   CALCIUM 8.5 (L) 03/26/2023 0328   GFRNONAA >60 03/26/2023 0328     Xray: THA components in good position. No adverse features.  Assessment/Plan: 3 Days Post-Op   Principal Problem:   Chronic left hip pain  S/p L THA for L hip OA Also with R scapular spine fx: plan to treat non-operatively with NWB/sling and repeat XR 1 week from injury  Post op recs: WB: WBAT LLE, No formal hip precautions, nonweightbearing right upper extremity for scapular body fracture Abx: ancef, DC with Cefadroxil x 7 days Imaging: PACU pelvis Xray, CT R shoulder, repeat shoulder Xray 1 week post injury Dressing: Aquacell, keep intact until follow up DVT prophylaxis: Aspirin 81BID starting POD1 DC: anticipate SNF placement  Follow  up: 2 weeks after surgery for a wound check with Dr. Blanchie Dessert at [facility].  Address: [address], [place], [place] [address]  Office Phone: [phone]    Shirley Mcdonald 03/28/2023, 7:06 AM   Weber Cooks, MD  Contact information:   [phone] 7am-5pm epic message Dr. Blanchie Dessert, or call office for patient follow up: [phone] After hours and holidays please check [email or web address] for group call information for [facility]

## 2023-03-28 NOTE — Discharge Summary (Addendum)
Physician Discharge Summary  Patient ID: Shirley Mcdonald MRN: [id number] DOB/AGE: March 28, 1951 72 y.o.  Admit date: 03/25/2023 Discharge date: 03/28/2023  Admission Diagnoses:  Chronic left hip pain  Discharge Diagnoses:  Principal Problem:   Chronic left hip pain   Past Medical History:  Diagnosis Date   Alpha (0) thalassemia (HCC)    Minor   Alpha thalassemia minor    Arthritis    Asthma    Complication of anesthesia    Combative   Depression    Diabetes mellitus without complication (HCC)    Diabetes mellitus without complication (HCC)    type 2    Dysrhythmia    Familial Mediterranean fever (HCC)    GERD (gastroesophageal reflux disease)    Headache    History of hiatal hernia    Hypertension    Hypothyroidism    Pneumonia    PONV (postoperative nausea and vomiting) 1979   Sleep apnea    Sleep apnea    No CPOP    Surgeries: Procedure(s): Left TOTAL HIP ARTHROPLASTY on 03/25/2023   Consultants (if any):   Discharged Condition: Improved  Hospital Course: Shirley Mcdonald is an 72 y.o. female who was admitted 03/25/2023 with a diagnosis of Chronic left hip pain and went to the operating room on 03/25/2023 and underwent the above named procedures.  Patient also with Right scapular body fracture. Imaging obtained and reviewed. Anticipate good healing with nonop treatment but needs to be immobilized and nonweight bearing for now.  She was given perioperative antibiotics:  Anti-infectives (From admission, onward)    Start     Dose/Rate Route Frequency Ordered Stop   03/27/23 0000  cefadroxil (DURICEF) 500 MG capsule        500 mg Oral 2 times daily 03/27/23 1127 04/03/23 2359   03/25/23 1830  ceFAZolin (ANCEF) IVPB 2g/100 mL premix        2 g 200 mL/hr over 30 Minutes Intravenous Every 6 hours 03/25/23 1730 03/28/23 1244   03/25/23 0745  ceFAZolin (ANCEF) IVPB 2g/100 mL premix        2 g 200 mL/hr over 30 Minutes Intravenous On call to O.R. 03/25/23 6295 03/25/23 1130      .  She was given sequential compression devices, early ambulation, and aspirin for DVT prophylaxis.  She benefited maximally from the hospital stay and there were no complications.    Recent vital signs:  Vitals:   03/27/23 2143 03/28/23 0617  BP: 106/60 115/74  Pulse: 92 85  Resp: 16 17  Temp: 98.7 F (37.1 C) 98.1 F (36.7 C)  SpO2: 93% 94%    Recent laboratory studies:  Lab Results  Component Value Date   HGB 9.1 (L) 03/27/2023   HGB 9.5 (L) 03/26/2023   HGB 11.3 (L) 03/12/2023   Lab Results  Component Value Date   WBC 6.6 03/27/2023   PLT 142 (L) 03/27/2023   No results found for: "INR" Lab Results  Component Value Date   NA 132 (L) 03/26/2023   K 3.7 03/26/2023   CL 99 03/26/2023   CO2 24 03/26/2023   BUN 8 03/26/2023   CREATININE 0.45 03/26/2023   GLUCOSE 163 (H) 03/26/2023    Discharge Medications:   Allergies as of 03/28/2023       Reactions   Hydromorphone Hcl Rash   Pt states she did not have a rash on this medication   Hydromorphone    Sleepy   Lotrel [amlodipine Besy-benazepril Hcl] Cough   Oxycontin [oxycodone  Hcl] Itching   Saccharin    Causes stomach pain    Tape    Blister   Adhesive [tape] Rash   Causes blistering   Amlodipine Besy-benazepril Hcl Cough   Saccharin Other (See Comments)        Medication List     STOP taking these medications    celecoxib 100 MG capsule Commonly known as: CELEBREX   oxyCODONE 5 MG immediate release tablet Commonly known as: Oxy IR/ROXICODONE   traMADol 50 MG tablet Commonly known as: ULTRAM       TAKE these medications    acetaminophen 500 MG tablet Commonly known as: TYLENOL Take 1 tablet (500 mg total) by mouth every 8 (eight) hours as needed.   ALKA-SELTZER ANTACID PO Take 1 tablet by mouth daily as needed (gummie.  chew one tablet if needed for GERD).   amLODipine 2.5 MG tablet Commonly known as: NORVASC Take 2.5 mg by mouth daily.   aspirin EC 81 MG tablet Take 1 tablet  (81 mg total) by mouth 2 (two) times daily for 28 days. Swallow whole.   atorvastatin 20 MG tablet Commonly known as: LIPITOR Take 20 mg by mouth every evening.   buPROPion 300 MG 24 hr tablet Commonly known as: WELLBUTRIN XL Take 300 mg by mouth daily.   butalbital-acetaminophen-caffeine 50-325-40 MG tablet Commonly known as: FIORICET Take 1 tablet by mouth every 6 (six) hours as needed for migraine.   cefadroxil 500 MG capsule Commonly known as: DURICEF Take 1 capsule (500 mg total) by mouth 2 (two) times daily for 7 days.   cetirizine 10 MG chewable tablet Commonly known as: ZYRTEC Chew 10 mg by mouth daily.   colchicine 0.6 MG tablet Take 0.6 mg by mouth 2 (two) times daily.   docusate sodium 250 MG capsule Commonly known as: COLACE Take 250 mg by mouth at bedtime.   fluticasone 50 MCG/ACT nasal spray Commonly known as: FLONASE Place 2 sprays into both nostrils daily as needed for allergies.   HYDROcodone-acetaminophen 10-325 MG tablet Commonly known as: NORCO Take 0.5-1 tablets by mouth every 4 (four) hours as needed for severe pain or moderate pain.   insulin lispro 100 UNIT/ML KwikPen Commonly known as: HUMALOG Inject 2-6 Units into the skin 3 (three) times daily with meals. Sliding scale 150-200=2 units, 201-250=4 units, 251-300=6 units   ipratropium 0.02 % nebulizer solution Commonly known as: ATROVENT Take 2.5 mLs (0.5 mg total) by nebulization 4 (four) times daily.   lamoTRIgine 200 MG tablet Commonly known as: LAMICTAL Take 200 mg by mouth every evening.   Lantus SoloStar 100 UNIT/ML Solostar Pen Generic drug: insulin glargine Inject 17 Units into the skin at bedtime.   metFORMIN 500 MG 24 hr tablet Commonly known as: GLUCOPHAGE-XR Take 500 mg by mouth 2 (two) times daily with a meal.   methocarbamol 500 MG tablet Commonly known as: ROBAXIN Take 1 tablet (500 mg total) by mouth every 8 (eight) hours as needed for up to 10 days for muscle  spasms.   metoprolol succinate 25 MG 24 hr tablet Commonly known as: TOPROL-XL Take 25 mg by mouth daily.   nortriptyline 75 MG capsule Commonly known as: PAMELOR Take 75 mg by mouth at bedtime.   olmesartan 20 MG tablet Commonly known as: BENICAR Take 20 mg by mouth daily.   ondansetron 4 MG tablet Commonly known as: Zofran Take 1 tablet (4 mg total) by mouth every 8 (eight) hours as needed for up to 14 days  for nausea or vomiting.   pantoprazole 40 MG tablet Commonly known as: PROTONIX Take 40 mg by mouth daily.   polyethylene glycol 17 g packet Commonly known as: MiraLax Take 17 g by mouth daily.   traZODone 50 MG tablet Commonly known as: DESYREL Take 100 mg by mouth at bedtime.        Diagnostic Studies: CT SHOULDER RIGHT WO CONTRAST  Result Date: 03/26/2023 CLINICAL DATA:  Right shoulder fracture after fall. History of shoulder arthroplasty in November of 2023 EXAM: CT OF THE UPPER RIGHT EXTREMITY WITHOUT CONTRAST 3-DIMENSIONAL CT IMAGE RENDERING ON ACQUISITION WORKSTATION TECHNIQUE: Multidetector CT imaging of the upper right extremity was performed according to the metal artifact reduction protocol. 3-dimensional CT images were rendered by post-processing of the original CT data on an acquisition workstation. The 3-dimensional CT images were interpreted and findings were reported in the accompanying complete CT report for this study RADIATION DOSE REDUCTION: This exam was performed according to the departmental dose-optimization program which includes automated exposure control, adjustment of the mA and/or kV according to patient size and/or use of iterative reconstruction technique. COMPARISON:  X-ray 03/25/2023 FINDINGS: Bones/Joint/Cartilage Postsurgical changes from reverse right shoulder arthroplasty. Arthroplasty components are in their expected alignment without dislocation. No periprosthetic fracture is identified. There is a mildly displaced fracture through the  scapular spine. No additional fractures are identified. Specifically, no distal clavicular fracture. AC joint intact with moderate to severe osteoarthritic changes. No evidence of an acromial fracture. No lytic or sclerotic bone lesion. Ligaments Suboptimally assessed by CT. Muscles and Tendons Rotator cuff muscle atrophy. Soft tissues Soft tissue swelling. No organized fluid collection or hematoma. No right axillary lymphadenopathy. IMPRESSION: 1. Mildly displaced fracture through the scapular spine. 2. Postsurgical changes from reverse right shoulder arthroplasty. No periprosthetic fracture or dislocation. 3. 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 Electronically Signed   By: Duanne Guess D.O.   On: 03/26/2023 09:29   CT 3D RECON AT SCANNER  Result Date: 03/26/2023 CLINICAL DATA:  Right shoulder fracture after fall. History of shoulder arthroplasty in November of 2023 EXAM: CT OF THE UPPER RIGHT EXTREMITY WITHOUT CONTRAST 3-DIMENSIONAL CT IMAGE RENDERING ON ACQUISITION WORKSTATION TECHNIQUE: Multidetector CT imaging of the upper right extremity was performed according to the metal artifact reduction protocol. 3-dimensional CT images were rendered by post-processing of the original CT data on an acquisition workstation. The 3-dimensional CT images were interpreted and findings were reported in the accompanying complete CT report for this study RADIATION DOSE REDUCTION: This exam was performed according to the departmental dose-optimization program which includes automated exposure control, adjustment of the mA and/or kV according to patient size and/or use of iterative reconstruction technique. COMPARISON:  X-ray 03/25/2023 FINDINGS: Bones/Joint/Cartilage Postsurgical changes from reverse right shoulder arthroplasty. Arthroplasty components are in their expected alignment without dislocation. No periprosthetic fracture is identified. There is a mildly displaced fracture through the scapular spine. No  additional fractures are identified. Specifically, no distal clavicular fracture. AC joint intact with moderate to severe osteoarthritic changes. No evidence of an acromial fracture. No lytic or sclerotic bone lesion. Ligaments Suboptimally assessed by CT. Muscles and Tendons Rotator cuff muscle atrophy. Soft tissues Soft tissue swelling. No organized fluid collection or hematoma. No right axillary lymphadenopathy. IMPRESSION: 1. Mildly displaced fracture through the scapular spine. 2. Postsurgical changes from reverse right shoulder arthroplasty. No periprosthetic fracture or dislocation. 3. 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 Electronically Signed   By: Duanne Guess D.O.   On: 03/26/2023 09:29  DG HIP UNILAT W OR W/O PELVIS 2-3 VIEWS LEFT  Result Date: 03/25/2023 CLINICAL DATA:  Postoperative left hip EXAM: DG HIP (WITH OR WITHOUT PELVIS) 2-3V LEFT COMPARISON:  Pelvis and left hip radiographs 12/30/2022 FINDINGS: Diffuse decreased bone mineralization. New total left hip arthroplasty. No perihardware lucency is seen to indicate hardware failure or loosening. Postsurgical changes including subcutaneous air about the left hip. 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 Numerous surgical clips overlie the pelvis. IMPRESSION: Interval total left hip arthroplasty without evidence of hardware failure or loosening. Electronically Signed   By: Neita Garnet M.D.   On: 03/25/2023 15:34   DG Pelvis Portable  Result Date: 03/25/2023 CLINICAL DATA:  Intraoperative for total left hip arthroplasty. EXAM: PORTABLE PELVIS 1-2 VIEWS COMPARISON:  CT abdomen pelvis 01/05/2020 FINDINGS: Intraoperative single frontal view of the lower pelvis. There is a left acetabular cup with two superior fixation screws. There is a left femoral stem prosthesis, appropriately aligned. IMPRESSION: Intraoperative single frontal view of the lower pelvis during total left hip  arthroplasty. Electronically Signed   By: Neita Garnet M.D.   On: 03/25/2023 13:36   DG Shoulder Right  Result Date: 03/25/2023 CLINICAL DATA:  Preoperative evaluation. Fell in bathroom this morning. Used right arm to catch herself. Status post right shoulder arthroplasty 06/2022. Patient heard a pop in right shoulder. EXAM: RIGHT SHOULDER - 2+ VIEW COMPARISON:  Right shoulder radiograph 07/18/2022 FINDINGS: Postsurgical changes are again seen of reverse total right shoulder arthroplasty. No perihardware lucency is seen to indicate hardware failure or loosening. There appears to be normal alignment of the glenoid and humeral head prostheses. There are at least two curvilinear regions of lucency within the superior right glenoid medial to the superior glenoid dome prosthesis screws that are suspicious for an acute nondisplaced fracture. There is also lucency overlying the lateral right clavicle shaft suspicious for an acute nondisplaced fracture. There is lucency and up to approximately 5 mm superior cortical step-off within the superior aspect of the scapula, likely at the scapular spine, new from prior. IMPRESSION: 1. Redemonstration of postsurgical changes of reverse total right shoulder arthroplasty. 2. Findings suspicious for acute fractures of the superior right glenoid, lateral right clavicle shaft, and superior aspect of the scapula. Electronically Signed   By: Neita Garnet M.D.   On: 03/25/2023 10:38    Disposition: Discharge disposition: 03-Skilled Nursing Facility       Discharge Instructions     Call MD / Call 911   Complete by: As directed    If you experience chest pain or shortness of breath, CALL 911 and be transported to the hospital emergency room.  If you develope a fever above 101 F, pus (white drainage) or increased drainage or redness at the wound, or calf pain, call your surgeon's office.   Constipation Prevention   Complete by: As directed    Drink plenty of fluids.  Prune  juice may be helpful.  You may use a stool softener, such as Colace (over the counter) 100 mg twice a day.  Use MiraLax (over the counter) for constipation as needed.   Diet - low sodium heart healthy   Complete by: As directed    Discharge instructions   Complete by: As directed    Please refrain from bearing weight on your right arm for at least another 2-4 weeks.  And continue to utilize the sling.  At one week from injury, you may begin  taking the arm VERY GENTLY out of the sling twice a day just to straighten and bend the elbow -- keep the shoulder still while doing this.  Then place back in sling.  We will re-evaluate this and your hip at your next follow up visit.  You have also been prescribed an antibiotic.  Take this as prescribed for the full 7 days.  Always take with plenty of food and water.   Driving restrictions   Complete by: As directed    No driving for 4 weeks   Follow the hip precautions as taught in Physical Therapy   Complete by: As directed    Increase activity slowly as tolerated   Complete by: As directed    Post-operative opioid taper instructions:   Complete by: As directed    POST-OPERATIVE OPIOID TAPER INSTRUCTIONS: It is important to wean off of your opioid medication as soon as possible. If you do not need pain medication after your surgery it is ok to stop day one. Opioids include: Codeine, Hydrocodone(Norco, Vicodin), Oxycodone(Percocet, oxycontin) and hydromorphone amongst others.  Long term and even short term use of opiods can cause: Increased pain response Dependence Constipation Depression Respiratory depression And more.  Withdrawal symptoms can include Flu like symptoms Nausea, vomiting And more Techniques to manage these symptoms Hydrate well Eat regular healthy meals Stay active Use relaxation techniques(deep breathing, meditating, yoga) Do Not substitute Alcohol to help with tapering If you have been on opioids for less than two weeks  and do not have pain than it is ok to stop all together.  Plan to wean off of opioids This plan should start within one week post op of your joint replacement. Maintain the same interval or time between taking each dose and first decrease the dose.  Cut the total daily intake of opioids by one tablet each day Next start to increase the time between doses. The last dose that should be eliminated is the evening dose.      TED hose   Complete by: As directed    Use stockings (TED hose) for 2-4 weeks on surgical leg.  You may remove them at night for sleeping.        Contact information for follow-up providers     Joen Laura, MD Follow up in 2 week(s).   Specialty: Orthopedic Surgery Contact information: [address] [place] [address] [phone]              Contact information for after-discharge care     Destination     [facility] .   Service: Skilled Nursing Contact information: [address] [place] [address] [phone]                        Discharge Instructions      INSTRUCTIONS AFTER JOINT REPLACEMENT   Remove items at home which could result in a fall. This includes throw rugs or furniture in walking pathways ICE to the affected joint every three hours while awake for 30 minutes at a time, for at least the first 3-5 days, and then as needed for pain and swelling.  Continue to use ice for pain and swelling. You may notice swelling that will progress down to the foot and ankle.  This is normal after surgery.  Elevate your leg when you are not up walking on it.   Continue to use the breathing machine you got in the  hospital (incentive spirometer) which will help keep your temperature down.  It is common for your temperature to cycle up and down following surgery, especially at night when you are not up moving around and exerting yourself.  The breathing machine keeps your lungs expanded and  your temperature down.   DIET:  As you were doing prior to hospitalization, we recommend a well-balanced diet.  DRESSING / WOUND CARE / SHOWERING  Keep the surgical dressing until follow up.  The dressing is water proof, so you can shower without any extra covering.  IF THE DRESSING FALLS OFF or the wound gets wet inside, change the dressing with sterile gauze.  Please use good hand washing techniques before changing the dressing.  Do not use any lotions or creams on the incision until instructed by your surgeon.    ACTIVITY  Increase activity slowly as tolerated, but follow the weight bearing instructions below.   No driving for 6 weeks or until further direction given by your physician.  You cannot drive while taking narcotics.  No lifting or carrying greater than 10 lbs. until further directed by your surgeon. Avoid periods of inactivity such as sitting longer than an hour when not asleep. This helps prevent blood clots.  You may return to work once you are authorized by your doctor.     WEIGHT BEARING   Weight bearing as tolerated with assist device (walker, cane, etc) as directed for left lower extremity, use it as long as suggested by your surgeon or therapist, typically at least 4-6 weeks.  However, refrain from bearing weight on your right upper extremity due to new scapular fracture.   EXERCISES  Results after joint replacement surgery are often greatly improved when you follow the exercise, range of motion and muscle strengthening exercises prescribed by your doctor. Safety measures are also important to protect the joint from further injury. Any time any of these exercises cause you to have increased pain or swelling, decrease what you are doing until you are comfortable again and then slowly increase them. If you have problems or questions, call your caregiver or physical therapist for advice.   Rehabilitation is important following a joint replacement. After just a few days  of immobilization, the muscles of the leg can become weakened and shrink (atrophy).  These exercises are designed to build up the tone and strength of the thigh and leg muscles and to improve motion. Often times heat used for twenty to thirty minutes before working out will loosen up your tissues and help with improving the range of motion but do not use heat for the first two weeks following surgery (sometimes heat can increase post-operative swelling).   These exercises can be done on a training (exercise) mat, on the floor, on a table or on a bed. Use whatever works the best and is most comfortable for you.    Use music or television while you are exercising so that the exercises are a pleasant break in your day. This will make your life better with the exercises acting as a break in your routine that you can look forward to.   Perform all exercises about fifteen times, three times per day or as directed.  You should exercise both the operative leg and the other leg as well.  Exercises include:   Quad Sets - Tighten up the muscle on the front of the thigh (Quad) and hold for 5-10 seconds.   Straight Leg Raises - With your knee straight (if  you were given a brace, keep it on), lift the leg to 60 degrees, hold for 3 seconds, and slowly lower the leg.  Perform this exercise against resistance later as your leg gets stronger.  Leg Slides: Lying on your back, slowly slide your foot toward your buttocks, bending your knee up off the floor (only go as far as is comfortable). Then slowly slide your foot back down until your leg is flat on the floor again.  Angel Wings: Lying on your back spread your legs to the side as far apart as you can without causing discomfort.  Hamstring Strength:  Lying on your back, push your heel against the floor with your leg straight by tightening up the muscles of your buttocks.  Repeat, but this time bend your knee to a comfortable angle, and push your heel against the floor.   You may put a pillow under the heel to make it more comfortable if necessary.   A rehabilitation program following joint replacement surgery can speed recovery and prevent re-injury in the future due to weakened muscles. Contact your doctor or a physical therapist for more information on knee rehabilitation.    CONSTIPATION  Constipation is defined medically as fewer than three stools per week and severe constipation as less than one stool per week.  Even if you have a regular bowel pattern at home, your normal regimen is likely to be disrupted due to multiple reasons following surgery.  Combination of anesthesia, postoperative narcotics, change in appetite and fluid intake all can affect your bowels.   YOU MUST use at least one of the following options; they are listed in order of increasing strength to get the job done.  They are all available over the counter, and you may need to use some, POSSIBLY even all of these options:    Drink plenty of fluids (prune juice may be helpful) and high fiber foods Colace 100 mg by mouth twice a day  Senokot for constipation as directed and as needed Dulcolax (bisacodyl), take with full glass of water  Miralax (polyethylene glycol) once or twice a day as needed.  If you have tried all these things and are unable to have a bowel movement in the first 3-4 days after surgery call either your surgeon or your primary doctor.    If you experience loose stools or diarrhea, hold the medications until you stool forms back up.  If your symptoms do not get better within 1 week or if they get worse, check with your doctor.  If you experience "the worst abdominal pain ever" or develop nausea or vomiting, please contact the office immediately for further recommendations for treatment.   ITCHING:  If you experience itching with your medications, try taking only a single pain pill, or even half a pain pill at a time.  You can also use Benadryl over the counter for itching  or also to help with sleep.   TED HOSE STOCKINGS:  Use stockings on both legs until for at least 2 weeks or as directed by physician office. They may be removed at night for sleeping.  MEDICATIONS:  See your medication summary on the "After Visit Summary" that nursing will review with you.  You may have some home medications which will be placed on hold until you complete the course of blood thinner medication.  It is important for you to complete the blood thinner medication as prescribed.   Blood clot prevention (DVT Prophylaxis): After surgery you are at  an increased risk for a blood clot. you were prescribed a blood thinner, Aspirin 81mg , to be taken twice daily for a total of 4 weeks from surgery to help reduce your risk of getting a blood clot. This will help prevent a blood clot. Signs of a pulmonary embolus (blood clot in the lungs) include sudden short of breath, feeling lightheaded or dizzy, chest pain with a deep breath, rapid pulse rapid breathing. Signs of a blood clot in your arms or legs include new unexplained swelling and cramping, warm, red or darkened skin around the painful area. Please call the office or 911 right away if these signs or symptoms develop.  PRECAUTIONS:  If you experience chest pain or shortness of breath - call 911 immediately for transfer to the hospital emergency department.   If you develop a fever greater that 101 F, purulent drainage from wound, increased redness or drainage from wound, foul odor from the wound/dressing, or calf pain - CONTACT YOUR SURGEON.                                                   FOLLOW-UP APPOINTMENTS:  If you do not already have a post-op appointment, please call the office for an appointment to be seen by your surgeon.  Guidelines for how soon to be seen are listed in your "After Visit Summary", but are typically between 2-3 weeks after surgery.   POST-OPERATIVE OPIOID TAPER INSTRUCTIONS: It is important to wean off of your  opioid medication as soon as possible. If you do not need pain medication after your surgery it is ok to stop day one. Opioids include: Codeine, Hydrocodone(Norco, Vicodin), Oxycodone(Percocet, oxycontin) and hydromorphone amongst others.  Long term and even short term use of opiods can cause: Increased pain response Dependence Constipation Depression Respiratory depression And more.  Withdrawal symptoms can include Flu like symptoms Nausea, vomiting And more Techniques to manage these symptoms Hydrate well Eat regular healthy meals Stay active Use relaxation techniques(deep breathing, meditating, yoga) Do Not substitute Alcohol to help with tapering If you have been on opioids for less than two weeks and do not have pain than it is ok to stop all together.  Plan to wean off of opioids This plan should start within one week post op of your joint replacement. Maintain the same interval or time between taking each dose and first decrease the dose.  Cut the total daily intake of opioids by one tablet each day Next start to increase the time between doses. The last dose that should be eliminated is the evening dose.   MAKE SURE YOU:  Understand these instructions.  Get help right away if you are not doing well or get worse.    Thank you for letting us be a part of your medical care team.  It is a privilege we respect greatly.  We hope these instructions will help you stay on track for a fast and full recovery!           Signed:  A  03/28/2023, 3:14 PM

## 2023-03-28 NOTE — TOC Transition Note (Signed)
Transition of Care [facility]) - CM/SW Discharge Note  Patient Details  Name: Shirley Mcdonald MRN: [id number] Date of Birth: 1951/01/12  Transition of Care [facility]) CM/SW Contact:  Ewing Schlein, LCSW Phone Number: 03/28/2023, 3:24 PM  Clinical Narrative: Patient received insurance authorization for SNF at [facility]. Patient has been approved for  03/28/2023-04/01/2023. Discharge summary, discharge orders, and SNF transfer report faxed to facility in hub. Medical necessity form done; PTAR scheduled. Discharge packet completed. LPN updated. TOC signing off.   Final next level of care: Skilled Nursing Facility Barriers to Discharge: Barriers Resolved  Patient Goals and CMS Choice CMS Medicare.gov Compare Post Acute Care list provided to:: Patient Choice offered to / list presented to : Patient  Discharge Placement Existing PASRR number confirmed : 03/26/23          Patient [facility] at: [facility] Patient to be transferred to facility by: PTAR Patient and family notified of of transfer: 03/28/23  Discharge Plan and Services Additional resources added to the After Visit Summary for   In-house Referral: Clinical Social Work Post Acute Care Choice: Skilled Nursing Facility          DME Arranged: N/A DME Agency: NA  Social Determinants of Health (SDOH) Interventions SDOH Screenings   Food Insecurity: No Food Insecurity (03/25/2023)  Housing: Low Risk  (03/25/2023)  Transportation Needs: No Transportation Needs (03/25/2023)  Utilities: Not At Risk (03/25/2023)  Financial Resource Strain: Low Risk  (01/23/2023)   Received from [facility]  Physical Activity: Unknown (01/23/2023)   Received from [facility]  Social Connections: Socially Integrated (01/23/2023)   Received from [facility]  Stress: No Stress Concern Present (02/05/2023)   Received from [facility]  Tobacco Use: Medium Risk (03/25/2023)   Readmission Risk Interventions     No data to display

## 2024-03-17 NOTE — ED Provider Notes (Signed)
 [facility] EMERGENCY DEPARTMENT  History   Chief Complaint: Shoulder Pain.  History of Present Illness:  History obtained from: Patient  Idona Stach is a 73 y.o. female who presents to the ED via [facility] from home with complaints of sudden onset of right shoulder pain tonight. Pain radiates into the right side of her chest. Patient states pain began after dinner and worsened when using her wheelchair to go to her room. She notes she was moving boxes into her apartment yesterday. She does report history of right shoulder arthroplasty in December 2024 secondary to torn rotator cuff. However, her pain today feels different. Patient is wheelchair bound at baseline and uses a manual wheelchair. She denies history of CAD. No recent falls or trauma.   ______________________ ROS: Pertinent positives and negatives per HPI. Pertinent past medical, surgical, social and family history records were reviewed. Current Medications and Allergies were reviewed.  Physical Exam   Vitals:   03/16/24 2051 03/17/24 0156 03/17/24 0456 03/17/24 0512  BP: 130/81 (!) 113/58  119/69  BP Location: Left arm   Left arm  Patient Position: Sitting   Sitting  Pulse: 83 66  66  Resp: 20 18 18 18   Temp: 99.1 F (37.3 C)     TempSrc: Oral     SpO2: 98% 100%  100%  Weight: 78 kg (172 lb)     Height: 157.5 cm (5' 2)         Physical Exam Vitals and nursing note reviewed.  Constitutional:      General: She is not in acute distress.    Appearance: She is not toxic-appearing or diaphoretic.  HENT:     Head: Normocephalic and atraumatic.     Right Ear: External ear normal.     Left Ear: External ear normal.     Nose: Nose normal.     Mouth/Throat:     Mouth: Mucous membranes are moist.     Pharynx: Oropharynx is clear.   Eyes:     Extraocular Movements: Extraocular movements intact.     Conjunctiva/sclera: Conjunctivae normal.     Pupils: Pupils are equal, round, and reactive to  light.    Cardiovascular:     Rate and Rhythm: Normal rate and regular rhythm.     Pulses: Normal pulses.     Heart sounds: Normal heart sounds.  Pulmonary:     Effort: Pulmonary effort is normal. No respiratory distress.     Breath sounds: Normal breath sounds. No stridor. No wheezing, rhonchi or rales.  Chest:     Chest wall: Tenderness (Tenderness to the anterior chest wall along the right parasternal border) present.  Abdominal:     General: Abdomen is flat. Bowel sounds are normal. There is no distension.     Palpations: Abdomen is soft.     Tenderness: There is no abdominal tenderness. There is no guarding or rebound.   Musculoskeletal:        General: No signs of injury.     Cervical back: Normal range of motion. No rigidity or tenderness.     Right lower leg: No edema.     Left lower leg: No edema.     Amputation Right Lower Extremity: Right leg is amputated above knee.   Skin:    General: Skin is warm and dry.     Capillary Refill: Capillary refill takes less than 2 seconds.     Findings: No rash.   Neurological:  General: No focal deficit present.     Mental Status: She is alert and oriented to person, place, and time. Mental status is at baseline.     Sensory: No sensory deficit.     Motor: No weakness.     Results   EKG Impression:  (Interpreted by me) Rate: 74 Rhythm: normal sinus rhythm Axis: normal Intervals: Normal ST-T Waves: Normal ST-T Waves Comparison with prior: No ischemic changes. There is no evidence of: WPW, ARVD, HCM, DeWinters T Waves, Brugada or Wellens Syndrome.   CXR Impression: (Interpreted by me) No acute disease.   Labs: Labs Reviewed  COMPREHENSIVE METABOLIC PANEL - Abnormal      Result Value   Sodium 134 (*)    Potassium 3.8     Chloride 102     CO2 23     Anion Gap 9     Glucose, Random 93     Blood Urea Nitrogen (BUN) 12     Creatinine 0.61     eGFR >90     Albumin 4.2     Total Protein 7.0     Bilirubin,  Total 0.7     Alkaline Phosphatase (ALP) 63     Aspartate Aminotransferase (AST) 38     Alanine Aminotransferase (ALT) 29     Calcium  9.2     BUN/Creatinine Ratio      CBC WITH DIFFERENTIAL - Abnormal   WBC 6.00     RBC 4.40     Hemoglobin 11.2 (*)    Hematocrit 32.3 (*)    Mean Corpuscular Volume (MCV) 73.4 (*)    Mean Corpuscular Hemoglobin (MCH) 25.5 (*)    Mean Corpuscular Hemoglobin Conc (MCHC) 34.7     Red Cell Distribution Width (RDW) 16.9     Platelet Count (PLT) 155     Mean Platelet Volume (MPV) 7.7     Neutrophils % 71     Lymphocytes % 14     Monocytes % 8     Eosinophils % 6     Basophils % 1     Neutrophils Absolute 4.20     Lymphocytes # 0.90 (*)    Monocytes # 0.50     Eosinophils # 0.30     Basophils # 0.10    TROPONIN, HIGH SENSITIVE X 2 (0 HOUR BASELINE + 2 HOUR) - Normal   Troponin, High Sensitive 5    TROPONIN, HIGH SENSITIVE, 2HR RFX - Normal   Troponin, High Sensitive 5       Imaging: XR Chest 2 Views  Final Result by Delmar Herbert Winfred Booker Results In [place] [id number] (07/28 2139)  CLINICAL DATA:  Chest pain    EXAM:  CHEST - 2 VIEW    COMPARISON:  Chest x-ray 10/14/2023    FINDINGS:  The heart is enlarged. The lungs are clear. There is no pleural  effusion or pneumothorax. Bilateral shoulder arthroplasties are  present.    IMPRESSION:  Cardiomegaly. No acute cardiopulmonary process.      Electronically Signed    By: Greig Pique M.D.    On: 03/16/2024 21:39      XR Shoulder Minimum 2 Views Right  Final Result by Delmar Herbert Winfred Booker Results In [place] [id number] (07/28 2138)  CLINICAL DATA:  Right shoulder pain    EXAM:  RIGHT SHOULDER - 2+ VIEW    COMPARISON:  None Available.    FINDINGS:  Right shoulder arthroplasty is present in anatomic alignment. There  is no hardware loosening identified.  No acute fracture identified.  Soft tissues are within normal limits.    IMPRESSION:  Right shoulder arthroplasty in anatomic alignment. No acute  fracture  identified.      Electronically Signed    By: Greig Pique M.D.    On: 03/16/2024 21:38        ED Medications Administered: Medications  ketorolac  (TORADOL ) injection 15 mg (15 mg intravenous Given 03/17/24 0457)  HYDROcodone -acetaminophen  (NORCO) 5-325 mg per tablet 1 tablet (1 tablet oral Given 03/17/24 0456)  acetaminophen  (TYLENOL ) tablet 650 mg (650 mg oral Given 03/17/24 0457)    ED Course & Medical Decision Making   External records were reviewed: Reviewed urology surgery note on 01/23/2024 for cystoscopy with suprapubic tube placement.  _________________________  Rojelio Caldron Vaux is a 73 y.o. female who was seen in the ED for chest pain and right shoulder pain.  The following differentials were considered: ACS, arrhythmia, cardiomyopathy, pneumonia, pneumothorax, costochondritis or other musculoskeletal pathology.  Pertinent studies were obtained, with results listed in chart above.  EKG is nonischemic, with no evidence of arrhythmia or significant conduction abnormality.  Delta troponin testing is normal.  Remainder of lab work is unremarkable.  X-ray imaging studies do not reveal any acute pathology.  Patient was given IV and oral analgesic medications with adequate symptom relief.  At this time, I have low suspicion for emergent etiology of patient's symptoms.  Her chest pain is musculoskeletal in nature.  While I initially considered hospital admission, based on reassuring ED work-up, I feel that patient is appropriate for discharge home with PCP follow-up.  Strict return precautions regarding this ED visit were discussed.   ED Clinical Impression   1. Chest wall pain   2. Acute pain of right shoulder     ED Disposition   ED Disposition     ED Disposition  Discharge   Condition  Stable   Comment  --         _________________ Scribe's Attestation: Dr. Zackowski obtained and performed the history, physical exam and medical decision making elements  that were entered into the chart. Documentation assistance was provided by me personally, a scribe. Signed by Sherryle Hamilton, Scribe on 03/23/2024 2:11 AM  Documentation assistance provided by the scribe. I was present during the time the encounter was recorded. The information recorded by the scribe was done at my direction and has been reviewed and validated by me. William Zackowski, DO  (Please note that portions of this note have been completed with Dragon voice recognition software.  Efforts were made to correct any errors, but occasionally words are mis-transcribed.)

## 2024-05-25 ENCOUNTER — Other Ambulatory Visit: Payer: Self-pay

## 2024-05-25 ENCOUNTER — Inpatient Hospital Stay (HOSPITAL_COMMUNITY)
Admission: EM | Admit: 2024-05-25 | Discharge: 2024-05-30 | DRG: 699 | Disposition: A | Discharge status: Home / Self Care | Payer: Medicare (Managed Care) | Attending: Internal Medicine | Admitting: Internal Medicine

## 2024-05-25 ENCOUNTER — Encounter (HOSPITAL_COMMUNITY): Payer: Self-pay

## 2024-05-25 ENCOUNTER — Emergency Department (HOSPITAL_COMMUNITY): Payer: Medicare (Managed Care)

## 2024-05-25 DIAGNOSIS — Z9889 Other specified postprocedural states: Secondary | ICD-10-CM | POA: Diagnosis not present

## 2024-05-25 DIAGNOSIS — K219 Gastro-esophageal reflux disease without esophagitis: Secondary | ICD-10-CM | POA: Diagnosis present

## 2024-05-25 DIAGNOSIS — Y846 Urinary catheterization as the cause of abnormal reaction of the patient, or of later complication, without mention of misadventure at the time of the procedure: Secondary | ICD-10-CM | POA: Diagnosis present

## 2024-05-25 DIAGNOSIS — B9629 Other Escherichia coli [E. coli] as the cause of diseases classified elsewhere: Secondary | ICD-10-CM | POA: Diagnosis not present

## 2024-05-25 DIAGNOSIS — Z7984 Long term (current) use of oral hypoglycemic drugs: Secondary | ICD-10-CM

## 2024-05-25 DIAGNOSIS — E119 Type 2 diabetes mellitus without complications: Secondary | ICD-10-CM | POA: Diagnosis present

## 2024-05-25 DIAGNOSIS — Z1152 Encounter for screening for COVID-19: Secondary | ICD-10-CM

## 2024-05-25 DIAGNOSIS — F419 Anxiety disorder, unspecified: Secondary | ICD-10-CM | POA: Diagnosis present

## 2024-05-25 DIAGNOSIS — Z89611 Acquired absence of right leg above knee: Secondary | ICD-10-CM

## 2024-05-25 DIAGNOSIS — Z8619 Personal history of other infectious and parasitic diseases: Secondary | ICD-10-CM

## 2024-05-25 DIAGNOSIS — Z888 Allergy status to other drugs, medicaments and biological substances status: Secondary | ICD-10-CM

## 2024-05-25 DIAGNOSIS — M1990 Unspecified osteoarthritis, unspecified site: Secondary | ICD-10-CM | POA: Diagnosis present

## 2024-05-25 DIAGNOSIS — D563 Thalassemia minor: Secondary | ICD-10-CM | POA: Diagnosis present

## 2024-05-25 DIAGNOSIS — J45909 Unspecified asthma, uncomplicated: Secondary | ICD-10-CM | POA: Diagnosis present

## 2024-05-25 DIAGNOSIS — Z96612 Presence of left artificial shoulder joint: Secondary | ICD-10-CM | POA: Diagnosis present

## 2024-05-25 DIAGNOSIS — Z96642 Presence of left artificial hip joint: Secondary | ICD-10-CM | POA: Diagnosis present

## 2024-05-25 DIAGNOSIS — M1A9XX0 Chronic gout, unspecified, without tophus (tophi): Secondary | ICD-10-CM | POA: Diagnosis present

## 2024-05-25 DIAGNOSIS — R339 Retention of urine, unspecified: Secondary | ICD-10-CM | POA: Diagnosis present

## 2024-05-25 DIAGNOSIS — E039 Hypothyroidism, unspecified: Secondary | ICD-10-CM | POA: Diagnosis present

## 2024-05-25 DIAGNOSIS — I10 Essential (primary) hypertension: Secondary | ICD-10-CM | POA: Diagnosis present

## 2024-05-25 DIAGNOSIS — Z87891 Personal history of nicotine dependence: Secondary | ICD-10-CM | POA: Diagnosis not present

## 2024-05-25 DIAGNOSIS — Z9359 Other cystostomy status: Secondary | ICD-10-CM | POA: Diagnosis not present

## 2024-05-25 DIAGNOSIS — Z87440 Personal history of urinary (tract) infections: Secondary | ICD-10-CM | POA: Diagnosis not present

## 2024-05-25 DIAGNOSIS — Z96653 Presence of artificial knee joint, bilateral: Secondary | ICD-10-CM | POA: Diagnosis present

## 2024-05-25 DIAGNOSIS — Z9049 Acquired absence of other specified parts of digestive tract: Secondary | ICD-10-CM

## 2024-05-25 DIAGNOSIS — Y731 Therapeutic (nonsurgical) and rehabilitative gastroenterology and urology devices associated with adverse incidents: Secondary | ICD-10-CM | POA: Diagnosis present

## 2024-05-25 DIAGNOSIS — F32A Depression, unspecified: Secondary | ICD-10-CM | POA: Diagnosis present

## 2024-05-25 DIAGNOSIS — Z794 Long term (current) use of insulin: Secondary | ICD-10-CM

## 2024-05-25 DIAGNOSIS — N3000 Acute cystitis without hematuria: Secondary | ICD-10-CM | POA: Diagnosis present

## 2024-05-25 DIAGNOSIS — B9620 Unspecified Escherichia coli [E. coli] as the cause of diseases classified elsewhere: Secondary | ICD-10-CM | POA: Diagnosis present

## 2024-05-25 DIAGNOSIS — Z9109 Other allergy status, other than to drugs and biological substances: Secondary | ICD-10-CM

## 2024-05-25 DIAGNOSIS — Z96611 Presence of right artificial shoulder joint: Secondary | ICD-10-CM | POA: Diagnosis present

## 2024-05-25 DIAGNOSIS — Z885 Allergy status to narcotic agent status: Secondary | ICD-10-CM

## 2024-05-25 DIAGNOSIS — R8279 Other abnormal findings on microbiological examination of urine: Secondary | ICD-10-CM | POA: Diagnosis present

## 2024-05-25 DIAGNOSIS — Z79899 Other long term (current) drug therapy: Secondary | ICD-10-CM

## 2024-05-25 DIAGNOSIS — Z9102 Food additives allergy status: Secondary | ICD-10-CM

## 2024-05-25 DIAGNOSIS — T83518A Infection and inflammatory reaction due to other urinary catheter, initial encounter: Secondary | ICD-10-CM | POA: Diagnosis present

## 2024-05-25 DIAGNOSIS — N319 Neuromuscular dysfunction of bladder, unspecified: Secondary | ICD-10-CM | POA: Diagnosis present

## 2024-05-25 DIAGNOSIS — Z1612 Extended spectrum beta lactamase (ESBL) resistance: Secondary | ICD-10-CM | POA: Diagnosis present

## 2024-05-25 DIAGNOSIS — N390 Urinary tract infection, site not specified: Principal | ICD-10-CM | POA: Diagnosis present

## 2024-05-25 DIAGNOSIS — G4733 Obstructive sleep apnea (adult) (pediatric): Secondary | ICD-10-CM | POA: Diagnosis present

## 2024-05-25 DIAGNOSIS — Z8701 Personal history of pneumonia (recurrent): Secondary | ICD-10-CM

## 2024-05-25 LAB — COMPREHENSIVE METABOLIC PANEL WITH GFR
ALT: 21 U/L (ref 0–44)
AST: 27 U/L (ref 15–41)
Albumin: 4.1 g/dL (ref 3.5–5.0)
Alkaline Phosphatase: 57 U/L (ref 38–126)
Anion gap: 11 (ref 5–15)
BUN: 18 mg/dL (ref 8–23)
CO2: 23 mmol/L (ref 22–32)
Calcium: 9.3 mg/dL (ref 8.9–10.3)
Chloride: 97 mmol/L — ABNORMAL LOW (ref 98–111)
Creatinine, Ser: 0.54 mg/dL (ref 0.44–1.00)
GFR, Estimated: >60 mL/min (ref >60)
Glucose, Bld: 149 mg/dL — ABNORMAL HIGH (ref 70–99)
Potassium: 4.1 mmol/L (ref 3.5–5.1)
Sodium: 131 mmol/L — ABNORMAL LOW (ref 135–145)
Total Bilirubin: 0.7 mg/dL (ref 0.0–1.2)
Total Protein: 6.4 g/dL — ABNORMAL LOW (ref 6.5–8.1)

## 2024-05-25 LAB — URINALYSIS, ROUTINE W REFLEX MICROSCOPIC
Bilirubin Urine: NEGATIVE
Glucose, UA: NEGATIVE mg/dL
Hgb urine dipstick: NEGATIVE
Ketones, ur: NEGATIVE mg/dL
Nitrite: NEGATIVE
Protein, ur: NEGATIVE mg/dL
Specific Gravity, Urine: 1.011 (ref 1.005–1.030)
pH: 6 (ref 5.0–8.0)

## 2024-05-25 LAB — CBC WITH DIFFERENTIAL/PLATELET
Abs Immature Granulocytes: 0.02 K/uL (ref 0.00–0.07)
Basophils Absolute: 0 K/uL (ref 0.0–0.1)
Basophils Relative: 1 %
Eosinophils Absolute: 0.4 K/uL (ref 0.0–0.5)
Eosinophils Relative: 7 %
HCT: 30.2 % — ABNORMAL LOW (ref 36.0–46.0)
Hemoglobin: 10.1 g/dL — ABNORMAL LOW (ref 12.0–15.0)
Immature Granulocytes: 0 %
Lymphocytes Relative: 19 %
Lymphs Abs: 1.2 K/uL (ref 0.7–4.0)
MCH: 26 pg (ref 26.0–34.0)
MCHC: 33.4 g/dL (ref 30.0–36.0)
MCV: 77.6 fL — ABNORMAL LOW (ref 80.0–100.0)
Monocytes Absolute: 0.5 K/uL (ref 0.1–1.0)
Monocytes Relative: 8 %
Neutro Abs: 4.1 K/uL (ref 1.7–7.7)
Neutrophils Relative %: 65 %
Platelets: 126 K/uL — ABNORMAL LOW (ref 150–400)
RBC: 3.89 MIL/uL (ref 3.87–5.11)
RDW: 16.1 % — ABNORMAL HIGH (ref 11.5–15.5)
WBC: 6.3 K/uL (ref 4.0–10.5)
nRBC: 0 % (ref 0.0–0.2)

## 2024-05-25 LAB — LIPASE, BLOOD: Lipase: 28 U/L (ref 11–51)

## 2024-05-25 LAB — RESP PANEL BY RT-PCR (RSV, FLU A&B, COVID)  RVPGX2
Influenza A by PCR: NEGATIVE
Influenza B by PCR: NEGATIVE
Resp Syncytial Virus by PCR: NEGATIVE
SARS Coronavirus 2 by RT PCR: NEGATIVE

## 2024-05-25 MED ORDER — ENOXAPARIN SODIUM 40 MG/0.4ML IJ SOSY
40.0000 mg | PREFILLED_SYRINGE | INTRAMUSCULAR | Status: DC
  Administered 2024-05-26 – 2024-05-30 (×5): 40 mg via SUBCUTANEOUS
  Filled 2024-05-25 (×5): qty 0.4

## 2024-05-25 MED ORDER — ONDANSETRON HCL 4 MG PO TABS: 4.0000 mg | ORAL_TABLET | Freq: Four times a day (QID) | ORAL | Status: DC | PRN

## 2024-05-25 MED ORDER — ACETAMINOPHEN 650 MG RE SUPP: 650.0000 mg | Freq: Four times a day (QID) | RECTAL | Status: DC | PRN

## 2024-05-25 MED ORDER — SENNOSIDES-DOCUSATE SODIUM 8.6-50 MG PO TABS: 1.0000 | ORAL_TABLET | Freq: Every evening | ORAL | Status: DC | PRN

## 2024-05-25 MED ORDER — ACETAMINOPHEN 325 MG PO TABS
650.0000 mg | ORAL_TABLET | Freq: Four times a day (QID) | ORAL | Status: DC | PRN
  Administered 2024-05-26 – 2024-05-28 (×2): 650 mg via ORAL
  Filled 2024-05-25 (×2): qty 2

## 2024-05-25 MED ORDER — ONDANSETRON HCL 4 MG/2ML IJ SOLN: 4.0000 mg | Freq: Four times a day (QID) | INTRAMUSCULAR | Status: DC | PRN

## 2024-05-25 MED ORDER — SODIUM CHLORIDE 0.9 % IV SOLN
1.0000 g | Freq: Once | INTRAVENOUS | Status: AC
  Administered 2024-05-26: 1 g via INTRAVENOUS
  Filled 2024-05-25: qty 20

## 2024-05-25 MED ORDER — BISACODYL 5 MG PO TBEC: 5.0000 mg | DELAYED_RELEASE_TABLET | Freq: Every day | ORAL | Status: DC | PRN

## 2024-05-25 NOTE — ED Triage Notes (Signed)
 BIBA from [facility], c/o UTI for about a week.  Had cultures done, PCP received results today and told her she needs to come in and get IV abx  152/p HR 76 96% RA 212 CBG

## 2024-05-25 NOTE — H&P (Incomplete)
 History and Physical  Shirley Mcdonald FMW:[id number] DOB: 10/05/1950 DOA: 05/25/2024  PCP: Sebastian Othel GAILS, FNP   Chief Complaint: Positive urine culture  HPI: Shirley Mcdonald is a 73 y.o. 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  ED Course: Initial vitals show patient afebrile and normotensive. Initial labs significant for sodium 131, glucose 149, normal renal function, Hgb 10.1, WBC 6.3, platelet 126, negative flu, RSV and COVID test. CXR shows borderline cardiomegaly but no active disease. Pt received IV meropenem. TRH was consulted for admission.   Review of Systems: Please see HPI for pertinent positives and negatives. A complete 10 system review of systems are otherwise negative.  Past Medical History:  Diagnosis Date   Alpha (0) thalassemia    Minor   Alpha thalassemia minor    Arthritis    Asthma    Complication of anesthesia    Combative   Depression    Diabetes mellitus without complication (HCC)    Diabetes mellitus without complication (HCC)    type 2    Dysrhythmia    Familial Mediterranean fever (HCC)    GERD (gastroesophageal reflux disease)    Headache    History of hiatal hernia    Hypertension    Hypothyroidism    Pneumonia    PONV (postoperative nausea and vomiting) 1979   Sleep apnea    Sleep apnea    No CPOP   Past Surgical History:  Procedure Laterality Date   APPENDECTOMY     ARTHROSCOPY KNEE W/ DRILLING     CESAREAN SECTION     x2   CESAREAN SECTION  8018,8017   CHOLECYSTECTOMY     CHOLECYSTECTOMY  1975   CYST EXCISION Left    arm   EXPLORATORY LAPAROTOMY  1985   HERNIA REPAIR     x3   HERNIA REPAIR     3 hernias   KNEE SURGERY Left 1900   LAPAROTOMY  1985   LEG AMPUTATION     AKA   LEG AMPUTATION Right 2012   REPLACEMENT TOTAL KNEE Bilateral  05/2004   REVERSE SHOULDER ARTHROPLASTY Left 02/11/2019   Procedure: REVERSE SHOULDER ARTHROPLASTY;  Surgeon: Cristy Bonner DASEN, MD;  Location: [facility];  Service: Orthopedics;  Laterality: Left;   REVERSE SHOULDER ARTHROPLASTY Right 07/18/2022   Procedure: REVERSE SHOULDER ARTHROPLASTY;  Surgeon: Cristy Bonner DASEN, MD;  Location: [facility];  Service: Orthopedics;  Laterality: Right;   THIGH FASCIOTOMY Right    TONSILLECTOMY     TOTAL HIP ARTHROPLASTY Left 03/25/2023   Procedure: TOTAL HIP ARTHROPLASTY;  Surgeon: Edna Toribio LABOR, MD;  Location: [facility];  Service: Orthopedics;  Laterality: Left;   TOTAL SHOULDER ARTHROPLASTY Right 02/11/2019   TOTAL SHOULDER ARTHROPLASTY     ULNAR NERVE TRANSPOSITION Left 08/12/2019   Procedure: LEFT ELBOW ULNAR NERVE DISCOMPRESSION AND TRANSPOSITION;  Surgeon: Cristy Bonner DASEN, MD;  Location: [facility];  Service: Orthopedics;  Laterality: Left;   Social History:  reports that she quit smoking about 10 years ago. Her smoking use included cigarettes. She started smoking about 50 years ago. She has a 40 pack-year smoking history. She has never used smokeless tobacco. She reports current alcohol  use of about 1.0 standard drink of alcohol  per week. She reports current drug use. Drug: Other-see comments.  Allergies  Allergen Reactions   Hydromorphone  Hcl Rash  Pt states she did not have a rash on this medication   Hydromorphone      Sleepy   Lotrel [Amlodipine  Besy-Benazepril Hcl] Cough   Oxycontin  [Oxycodone  Hcl] Itching   Saccharin     Causes stomach pain    Tape     Blister    Adhesive [Tape] Rash    Causes blistering   Amlodipine  Besy-Benazepril Hcl Cough   Saccharin Other (See Comments)    History reviewed. No pertinent family history.   Prior to Admission medications   Medication Sig Start Date End Date Taking? Authorizing Provider  amLODipine  (NORVASC ) 2.5 MG tablet Take 2.5 mg by mouth daily.    [provider]  atorvastatin  (LIPITOR) 20 MG tablet  Take 20 mg by mouth every evening. 06/24/16   [provider]  buPROPion  (WELLBUTRIN  XL) 300 MG 24 hr tablet Take 300 mg by mouth daily. 08/17/16   [provider]  butalbital-acetaminophen -caffeine (FIORICET) 50-325-40 MG tablet Take 1 tablet by mouth every 6 (six) hours as needed for migraine. 03/29/22   [provider]  Calcium  Carbonate Antacid (ALKA-SELTZER ANTACID PO) Take 1 tablet by mouth daily as needed (gummie.  chew one tablet if needed for GERD).    [provider]  cetirizine (ZYRTEC) 10 MG chewable tablet Chew 10 mg by mouth daily.    [provider]  colchicine  0.6 MG tablet Take 0.6 mg by mouth 2 (two) times daily.    [provider]  docusate sodium  (COLACE) 250 MG capsule Take 250 mg by mouth at bedtime.    [provider]  fluticasone  (FLONASE ) 50 MCG/ACT nasal spray Place 2 sprays into both nostrils daily as needed for allergies. 01/03/23   [provider]  HYDROcodone -acetaminophen  (NORCO) 10-325 MG tablet Take 0.5-1 tablets by mouth every 4 (four) hours as needed for severe pain or moderate pain. 03/28/23   Edna Toribio LABOR, MD  insulin  lispro (HUMALOG ) 100 UNIT/ML KwikPen Inject 2-6 Units into the skin 3 (three) times daily with meals. Sliding scale 150-200=2 units, 201-250=4 units, 251-300=6 units 08/15/16   [provider]  ipratropium (ATROVENT ) 0.02 % nebulizer solution Take 2.5 mLs (0.5 mg total) by nebulization 4 (four) times daily. Patient not taking: Reported on 01/01/2023 09/10/20   Palumbo, April, MD  lamoTRIgine  (LAMICTAL ) 200 MG tablet Take 200 mg by mouth every evening. 12/13/22   [provider]  LANTUS  SOLOSTAR 100 UNIT/ML Solostar Pen Inject 17 Units into the skin at bedtime.    [provider]  metFORMIN  (GLUCOPHAGE -XR) 500 MG 24 hr tablet Take 500 mg by mouth 2 (two) times daily with a meal. 12/31/18   [provider]  metoprolol  succinate (TOPROL -XL) 25 MG 24  hr tablet Take 25 mg by mouth daily.    [provider]  nortriptyline  (PAMELOR ) 75 MG capsule Take 75 mg by mouth at bedtime.    [provider]  olmesartan (BENICAR) 20 MG tablet Take 20 mg by mouth daily.    [provider]  pantoprazole  (PROTONIX ) 40 MG tablet Take 40 mg by mouth daily.    [provider]  polyethylene glycol (MIRALAX ) 17 g packet Take 17 g by mouth daily. 03/27/23   Renae Bernarda HERO, PA-C  traZODone  (DESYREL ) 50 MG tablet Take 100 mg by mouth at bedtime. 07/06/19   [provider]    Physical Exam: BP 137/68   Pulse 71   Temp 98.7 F (37.1 C)   Resp 18   Ht 5' 2 (  1.575 m)   Wt 78 kg   SpO2 99%   BMI 31.46 kg/m  General: Pleasant, well-appearing *** laying in bed. No acute distress. HEENT: [place]/AT. Anicteric sclera CV: RRR. No murmurs, rubs, or gallops. No LE edema Pulmonary: Lungs CTAB. Normal effort. No wheezing or rales. Abdominal: Soft, nontender, nondistended. Normal bowel sounds. Extremities: Palpable radial and DP pulses. Normal ROM. Skin: Warm and dry. No obvious rash or lesions. Neuro: A&Ox3. Moves all extremities. Normal sensation to light touch. No focal deficit. Psych: Normal mood and affect          Labs on Admission:  Basic Metabolic Panel: Recent Labs  Lab 05/25/24 2241  NA 131*  K 4.1  CL 97*  CO2 23  GLUCOSE 149*  BUN 18  CREATININE 0.54  CALCIUM  9.3   Liver Function Tests: Recent Labs  Lab 05/25/24 2241  AST 27  ALT 21  ALKPHOS 57  BILITOT 0.7  PROT 6.4*  ALBUMIN 4.1   Recent Labs  Lab 05/25/24 2241  LIPASE 28   No results for input(s): AMMONIA in the last 168 hours. CBC: Recent Labs  Lab 05/25/24 2241  WBC 6.3  NEUTROABS 4.1  HGB 10.1*  HCT 30.2*  MCV 77.6*  PLT 126*   Cardiac Enzymes: No results for input(s): CKTOTAL, CKMB, CKMBINDEX, TROPONINI in the last 168 hours. BNP (last 3 results) No results for input(s): BNP in the last 8760  hours.  ProBNP (last 3 results) No results for input(s): PROBNP in the last 8760 hours.  CBG: No results for input(s): GLUCAP in the last 168 hours.  Radiological Exams on Admission: DG Chest Portable 1 View Result Date: 05/25/2024 CLINICAL DATA:  Shortness of breath. EXAM: PORTABLE CHEST 1 VIEW COMPARISON:  01/01/2023 FINDINGS: The heart is borderline enlarged. The cardiomediastinal contours are normal. The lungs are clear. Pulmonary vasculature is normal. No consolidation, pleural effusion, or pneumothorax. Bilateral shoulder arthroplasties. No acute osseous abnormalities are seen. IMPRESSION: Borderline cardiomegaly. Electronically Signed   By: Andrea Gasman M.D.   On: 05/25/2024 20:40   Assessment/Plan Shirley Mcdonald is a 73 y.o. 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   # Acute cystitis - Pt presented with *** - Urinalysis shows signs of infection - Continue IV rocephin - F/u urine culture - Trend CBC and fever curve  # T2DM - -  # HTN - -  # HLD - Continue atorvastatin   # Hypothyroidism -  # Anxiety and depression -  #***  #***  DVT prophylaxis: Lovenox      Code Status: Prior  Consults called: None  Family Communication: ***  Severity of Illness: The appropriate patient status for this patient is INPATIENT. Inpatient status is judged to be reasonable and necessary in order to provide the required intensity of service to ensure the patient's safety. The patient's presenting symptoms, physical exam findings, and initial radiographic and laboratory data in the context of their chronic comorbidities is felt to place them at high risk for further clinical deterioration. Furthermore, it is not anticipated that the patient will be medically stable for discharge from the  hospital within 2 midnights of admission.   * I certify that at the point of admission it is my clinical judgment that the patient will require inpatient hospital care spanning beyond 2 midnights from the point of admission due  to high intensity of service, high risk for further deterioration and high frequency of surveillance required.*  Level of care: Med-Surg    Lou Claretta HERO, MD 05/25/2024, 11:20 PM [facility] Pager: [phone] Isaiah 41:10   If 7PM-7AM, please contact night-coverage [email or web address] Password [email or web address]

## 2024-05-25 NOTE — Progress Notes (Signed)
 ED Pharmacy Antibiotic Sign Off An antibiotic consult was received from an ED provider for Meropenem per pharmacy dosing for ESBL infection. A chart review was completed to assess appropriateness.   The following one time order(s) were placed:  Meropenem 1g IV  Further antibiotic and/or antibiotic pharmacy consults should be ordered by the admitting provider if indicated.   Thank you for allowing pharmacy to be a part of this patient's care.   Arvin Gauss, PharmD Clinical Pharmacist 05/25/24 11:19 PM

## 2024-05-25 NOTE — ED Provider Notes (Signed)
 [place] EMERGENCY DEPARTMENT AT [facility] Provider Note   CSN: [id number] Arrival date & time: 05/25/24  2004     Patient presents with: positive blood cultures   Shirley Mcdonald is a 73 y.o. female.   73 year old female with suprapubic catheter, diabetes, and hypertension presenting to the emergency department today with concern for positive urine culture.  The patient has been having some discomfort as well as some cloudy urine from her suprapubic catheter and this was replaced a few days ago.  The patient states she is continuing to have cloudy urine from this.  She states that she has been having a cough as well that has been minimally productive.  She has been having some fatigue and chills but denies any fevers.  She got a call from her doctor tonight and was told that she needed to come to the hospital for IV antibiotics and to have repeat cultures drawn.        Prior to Admission medications   Medication Sig Start Date End Date Taking? Authorizing Provider  amLODipine  (NORVASC ) 2.5 MG tablet Take 2.5 mg by mouth daily.    [provider]  atorvastatin  (LIPITOR) 20 MG tablet Take 20 mg by mouth every evening. 06/24/16   [provider]  buPROPion  (WELLBUTRIN  XL) 300 MG 24 hr tablet Take 300 mg by mouth daily. 08/17/16   [provider]  butalbital-acetaminophen -caffeine (FIORICET) 50-325-40 MG tablet Take 1 tablet by mouth every 6 (six) hours as needed for migraine. 03/29/22   [provider]  Calcium  Carbonate Antacid (ALKA-SELTZER ANTACID PO) Take 1 tablet by mouth daily as needed (gummie.  chew one tablet if needed for GERD).    [provider]  cetirizine (ZYRTEC) 10 MG chewable tablet Chew 10 mg by mouth daily.    [provider]  colchicine  0.6 MG tablet Take 0.6 mg by mouth 2 (two) times daily.    [provider]  docusate sodium  (COLACE) 250 MG capsule Take 250 mg by mouth at bedtime.    [provider]  fluticasone  (FLONASE ) 50 MCG/ACT nasal spray Place 2 sprays into both nostrils daily as needed for allergies. 01/03/23   [provider]  HYDROcodone -acetaminophen  (NORCO) 10-325 MG tablet Take 0.5-1 tablets by mouth every 4 (four) hours as needed for severe pain or moderate pain. 03/28/23   Edna Toribio LABOR, MD  insulin  lispro (HUMALOG ) 100 UNIT/ML KwikPen Inject 2-6 Units into the skin 3 (three) times daily with meals. Sliding scale 150-200=2 units, 201-250=4 units, 251-300=6 units 08/15/16   [provider]  ipratropium (ATROVENT ) 0.02 % nebulizer solution Take 2.5 mLs (0.5 mg total) by nebulization 4 (four) times daily. Patient not taking: Reported on 01/01/2023 09/10/20   Palumbo, April, MD  lamoTRIgine  (LAMICTAL ) 200 MG tablet Take 200 mg by mouth every evening. 12/13/22   [provider]  LANTUS  SOLOSTAR 100 UNIT/ML Solostar Pen Inject 17 Units into the skin at bedtime.    [provider]  metFORMIN  (GLUCOPHAGE -XR) 500 MG 24 hr tablet Take 500 mg by mouth 2 (two) times daily with a meal. 12/31/18   [provider]  metoprolol  succinate (TOPROL -XL) 25 MG 24 hr tablet Take 25 mg by mouth daily.    [provider]  nortriptyline  (PAMELOR ) 75 MG capsule Take 75 mg by mouth at bedtime.    [provider]  olmesartan (BENICAR) 20 MG tablet Take 20 mg by mouth daily.    [provider]  pantoprazole  (PROTONIX )  40 MG tablet Take 40 mg by mouth daily.    [provider]  polyethylene glycol (MIRALAX ) 17 g packet Take 17 g by mouth daily. 03/27/23   Renae Bernarda HERO, PA-C  traZODone  (DESYREL ) 50 MG tablet Take 100 mg by mouth at bedtime. 07/06/19   [provider]    Allergies: Hydromorphone  hcl, Hydromorphone , Lotrel [amlodipine  besy-benazepril hcl], Oxycontin  [oxycodone  hcl], Saccharin, Tape, Adhesive [tape], Amlodipine  besy-benazepril hcl, and Saccharin    Review of Systems  Constitutional:   Positive for chills and fatigue.  Respiratory:  Positive for cough.   All other systems reviewed and are negative.   Updated Vital Signs BP 137/68   Pulse 71   Temp 98.7 F (37.1 C)   Resp 18   Ht 5' 2 (1.575 m)   Wt 78 kg   SpO2 99%   BMI 31.46 kg/m   Physical Exam Vitals and nursing note reviewed.   Gen: NAD, chronically ill appearing Eyes: PERRL, EOMI HEENT: no oropharyngeal swelling Neck: trachea midline Resp: clear to auscultation bilaterally, diminished at bilateral lung bases Card: RRR, no murmurs, rubs, or gallops Abd: nontender, nondistended Extremities: no calf tenderness, no edema Vascular: 2+ radial pulses bilaterally, 2+ DP pulses bilaterally Skin: no rashes Psyc: acting appropriately   (all labs ordered are listed, but only abnormal results are displayed) Labs Reviewed  CBC WITH DIFFERENTIAL/PLATELET - Abnormal; Notable for the following components:      Result Value   Hemoglobin 10.1 (*)    HCT 30.2 (*)    MCV 77.6 (*)    RDW 16.1 (*)    Platelets 126 (*)    All other components within normal limits  RESP PANEL BY RT-PCR (RSV, FLU A&B, COVID)  [id number]  CULTURE, BLOOD (ROUTINE X 2)  CULTURE, BLOOD (ROUTINE X 2)  COMPREHENSIVE METABOLIC PANEL WITH GFR  LIPASE, BLOOD  URINALYSIS, ROUTINE W REFLEX MICROSCOPIC    EKG: None  Radiology: DG Chest Portable 1 View Result Date: 05/25/2024 CLINICAL DATA:  Shortness of breath. EXAM: PORTABLE CHEST 1 VIEW COMPARISON:  01/01/2023 FINDINGS: The heart is borderline enlarged. The cardiomediastinal contours are normal. The lungs are clear. Pulmonary vasculature is normal. No consolidation, pleural effusion, or pneumothorax. Bilateral shoulder arthroplasties. No acute osseous abnormalities are seen. IMPRESSION: Borderline cardiomegaly. Electronically Signed   By: Andrea Gasman M.D.   On: 05/25/2024 20:40     Procedures   Medications Ordered in the ED  meropenem (MERREM) 1 g in sodium chloride  0.9 % 100 mL  IVPB (has no administration in time range)                                    Medical Decision Making 73 year old female with suprapubic catheter, diabetes, hypertension, neurogenic bladder presented to the emergency department today with concern for positive urine culture.  Obtain basic labs here as well as a chest x-ray to evaluate for infection.  Also obtain blood cultures.  Unfortunately we do not have results from her urine culture and it is after hours now.  She will require admission.  I did call and discussed the patient's case with our pharmacy team.  Looks like she has had ESBL in urine in the past.  Recommended a dose of meropenem.  This is started here.  A call was placed to hospital service for admission.  Amount and/or Complexity of Data Reviewed Labs: ordered. Radiology: ordered.  Risk Decision regarding  hospitalization.        Final diagnoses:  UTI due to extended-spectrum beta lactamase (ESBL) producing Escherichia coli    ED Discharge Orders     None          Ula Prentice SAUNDERS, MD 05/25/24 2306

## 2024-05-26 DIAGNOSIS — I10 Essential (primary) hypertension: Secondary | ICD-10-CM

## 2024-05-26 DIAGNOSIS — M1A9XX0 Chronic gout, unspecified, without tophus (tophi): Secondary | ICD-10-CM

## 2024-05-26 DIAGNOSIS — N3000 Acute cystitis without hematuria: Secondary | ICD-10-CM | POA: Diagnosis not present

## 2024-05-26 DIAGNOSIS — E119 Type 2 diabetes mellitus without complications: Secondary | ICD-10-CM

## 2024-05-26 DIAGNOSIS — Z9889 Other specified postprocedural states: Secondary | ICD-10-CM | POA: Diagnosis not present

## 2024-05-26 DIAGNOSIS — K219 Gastro-esophageal reflux disease without esophagitis: Secondary | ICD-10-CM

## 2024-05-26 DIAGNOSIS — N390 Urinary tract infection, site not specified: Principal | ICD-10-CM

## 2024-05-26 DIAGNOSIS — N319 Neuromuscular dysfunction of bladder, unspecified: Secondary | ICD-10-CM | POA: Diagnosis not present

## 2024-05-26 DIAGNOSIS — F419 Anxiety disorder, unspecified: Secondary | ICD-10-CM

## 2024-05-26 LAB — CBC
HCT: 30.8 % — ABNORMAL LOW (ref 36.0–46.0)
Hemoglobin: 10 g/dL — ABNORMAL LOW (ref 12.0–15.0)
MCH: 25.9 pg — ABNORMAL LOW (ref 26.0–34.0)
MCHC: 32.5 g/dL (ref 30.0–36.0)
MCV: 79.8 fL — ABNORMAL LOW (ref 80.0–100.0)
Platelets: 109 K/uL — ABNORMAL LOW (ref 150–400)
RBC: 3.86 MIL/uL — ABNORMAL LOW (ref 3.87–5.11)
RDW: 16.2 % — ABNORMAL HIGH (ref 11.5–15.5)
WBC: 6.4 K/uL (ref 4.0–10.5)
nRBC: 0 % (ref 0.0–0.2)

## 2024-05-26 LAB — BASIC METABOLIC PANEL WITH GFR
Anion gap: 12 (ref 5–15)
BUN: 16 mg/dL (ref 8–23)
CO2: 23 mmol/L (ref 22–32)
Calcium: 9 mg/dL (ref 8.9–10.3)
Chloride: 98 mmol/L (ref 98–111)
Creatinine, Ser: 0.61 mg/dL (ref 0.44–1.00)
GFR, Estimated: >60 mL/min (ref >60)
Glucose, Bld: 142 mg/dL — ABNORMAL HIGH (ref 70–99)
Potassium: 4 mmol/L (ref 3.5–5.1)
Sodium: 133 mmol/L — ABNORMAL LOW (ref 135–145)

## 2024-05-26 LAB — HEMOGLOBIN A1C
Hgb A1c MFr Bld: 7.2 % — ABNORMAL HIGH (ref 4.8–5.6)
Mean Plasma Glucose: 159.94 mg/dL

## 2024-05-26 MED ORDER — DOCUSATE SODIUM 50 MG PO CAPS
250.0000 mg | ORAL_CAPSULE | Freq: Every day | ORAL | Status: DC
  Administered 2024-05-26 – 2024-05-29 (×4): 250 mg via ORAL
  Filled 2024-05-26 (×5): qty 1

## 2024-05-26 MED ORDER — COLCHICINE 0.6 MG PO TABS
0.6000 mg | ORAL_TABLET | Freq: Two times a day (BID) | ORAL | Status: DC
  Administered 2024-05-26 – 2024-05-30 (×9): 0.6 mg via ORAL
  Filled 2024-05-26 (×9): qty 1

## 2024-05-26 MED ORDER — LEVOTHYROXINE SODIUM 50 MCG PO TABS
50.0000 ug | ORAL_TABLET | Freq: Every day | ORAL | Status: DC
  Administered 2024-05-26 – 2024-05-30 (×5): 50 ug via ORAL
  Filled 2024-05-26 (×5): qty 1

## 2024-05-26 MED ORDER — COLCHICINE 0.6 MG PO TABS: 0.6000 mg | ORAL_TABLET | Freq: Two times a day (BID) | ORAL | Status: DC

## 2024-05-26 MED ORDER — CALCIUM CARBONATE 1250 (500 CA) MG PO TABS
500.0000 mg | ORAL_TABLET | Freq: Two times a day (BID) | ORAL | Status: DC
  Administered 2024-05-26 – 2024-05-30 (×9): 1250 mg via ORAL
  Filled 2024-05-26 (×9): qty 1

## 2024-05-26 MED ORDER — TRAZODONE HCL 100 MG PO TABS
100.0000 mg | ORAL_TABLET | Freq: Every day | ORAL | Status: DC
  Administered 2024-05-26 – 2024-05-29 (×4): 100 mg via ORAL
  Filled 2024-05-26 (×4): qty 1

## 2024-05-26 MED ORDER — FAMOTIDINE 20 MG PO TABS
40.0000 mg | ORAL_TABLET | Freq: Every day | ORAL | Status: DC
  Administered 2024-05-26 – 2024-05-30 (×5): 40 mg via ORAL
  Filled 2024-05-26 (×5): qty 2

## 2024-05-26 MED ORDER — OXYCODONE HCL 5 MG PO TABS
5.0000 mg | ORAL_TABLET | Freq: Once | ORAL | Status: AC | PRN
  Administered 2024-05-26: 5 mg via ORAL
  Filled 2024-05-26: qty 1

## 2024-05-26 MED ORDER — BUPROPION HCL ER (SR) 150 MG PO TB12
150.0000 mg | ORAL_TABLET | Freq: Two times a day (BID) | ORAL | Status: DC
  Administered 2024-05-26 – 2024-05-30 (×9): 150 mg via ORAL
  Filled 2024-05-26 (×10): qty 1

## 2024-05-26 MED ORDER — SODIUM CHLORIDE 0.9 % IV SOLN
2.0000 g | Freq: Two times a day (BID) | INTRAVENOUS | Status: DC
  Administered 2024-05-26 – 2024-05-29 (×6): 2 g via INTRAVENOUS
  Filled 2024-05-26 (×7): qty 12.5

## 2024-05-26 MED ORDER — SODIUM CHLORIDE 0.9 % IV SOLN: INTRAVENOUS | Status: AC | PRN

## 2024-05-26 MED ORDER — LAMOTRIGINE 100 MG PO TABS
100.0000 mg | ORAL_TABLET | Freq: Every evening | ORAL | Status: DC
  Administered 2024-05-26 – 2024-05-29 (×4): 100 mg via ORAL
  Filled 2024-05-26 (×4): qty 1

## 2024-05-26 MED ORDER — AMITRIPTYLINE HCL 25 MG PO TABS
25.0000 mg | ORAL_TABLET | Freq: Every day | ORAL | Status: DC
  Administered 2024-05-26 – 2024-05-29 (×4): 25 mg via ORAL
  Filled 2024-05-26 (×5): qty 1

## 2024-05-26 MED ORDER — RISAQUAD PO CAPS
1.0000 | ORAL_CAPSULE | Freq: Every day | ORAL | Status: DC
  Administered 2024-05-26 – 2024-05-30 (×5): 1 via ORAL
  Filled 2024-05-26 (×5): qty 1

## 2024-05-26 MED ORDER — DULOXETINE HCL 60 MG PO CPEP
60.0000 mg | ORAL_CAPSULE | Freq: Every day | ORAL | Status: DC
  Administered 2024-05-26 – 2024-05-30 (×5): 60 mg via ORAL
  Filled 2024-05-26 (×5): qty 1

## 2024-05-26 MED ORDER — VITAMIN D 25 MCG (1000 UNIT) PO TABS
5000.0000 [IU] | ORAL_TABLET | Freq: Every day | ORAL | Status: DC
  Administered 2024-05-26 – 2024-05-30 (×5): 5000 [IU] via ORAL
  Filled 2024-05-26 (×5): qty 5

## 2024-05-26 MED ORDER — CULTURELLE DIGESTIVE HEALTH PO CAPS: 1.0000 | ORAL_CAPSULE | Freq: Every day | ORAL | Status: DC

## 2024-05-26 MED ORDER — VITAMIN C 500 MG PO TABS
1000.0000 mg | ORAL_TABLET | Freq: Every day | ORAL | Status: DC
  Administered 2024-05-26 – 2024-05-30 (×5): 1000 mg via ORAL
  Filled 2024-05-26 (×5): qty 2

## 2024-05-26 MED ORDER — LORATADINE 10 MG PO TABS
10.0000 mg | ORAL_TABLET | Freq: Every day | ORAL | Status: DC
Start: 2024-05-26 — End: 2024-05-30
  Administered 2024-05-26 – 2024-05-29 (×4): 10 mg via ORAL
  Filled 2024-05-26 (×4): qty 1

## 2024-05-26 MED ORDER — OXYBUTYNIN CHLORIDE 5 MG PO TABS
5.0000 mg | ORAL_TABLET | Freq: Two times a day (BID) | ORAL | Status: DC
  Administered 2024-05-26 – 2024-05-30 (×9): 5 mg via ORAL
  Filled 2024-05-26 (×9): qty 1

## 2024-05-26 MED ORDER — SODIUM CHLORIDE 0.9 % IV SOLN
1.0000 g | Freq: Three times a day (TID) | INTRAVENOUS | Status: DC
  Administered 2024-05-26: 1 g via INTRAVENOUS
  Filled 2024-05-26: qty 20

## 2024-05-26 MED ORDER — PANTOPRAZOLE SODIUM 40 MG PO TBEC
40.0000 mg | DELAYED_RELEASE_TABLET | Freq: Every day | ORAL | Status: DC
  Administered 2024-05-26 – 2024-05-29 (×4): 40 mg via ORAL
  Filled 2024-05-26 (×4): qty 1

## 2024-05-26 MED ORDER — IRBESARTAN 150 MG PO TABS
150.0000 mg | ORAL_TABLET | Freq: Every day | ORAL | Status: DC
  Administered 2024-05-26 – 2024-05-28 (×3): 150 mg via ORAL
  Filled 2024-05-26 (×3): qty 1

## 2024-05-26 NOTE — TOC Transition Note (Signed)
 Transition of Care [facility]) - Discharge Note   Patient Details  Name: Shirley Mcdonald MRN: [id number] Date of Birth: 22-Feb-1951  [facility] CM/SW Contact:  Alfonse JONELLE Rex, RN Phone Number: 05/26/2024, 1:46 PM   Clinical Narrative:  Met with patient at bedside to introduce role of TOC/NCM and review for dc planning/ Patient confirmed she resides at [facility] with plan to return at discharge,  reports she receives her primary care through [facility], they provide medical home visits and a nurse visits for care of her suprapubic catheter monthly. Home DME: motorized w/c, manual w/c, shower bench. Patient reports [facility] usually provides transportation but the driver is out on medical leave, will need assistance with transportation back to [place]. TOC will continue to follow and assist with transportation back to [place] [facility].          Patient Goals and CMS Choice            Discharge Placement                       Discharge Plan and Services Additional resources added to the After Visit Summary for                                       Social Drivers of Health (SDOH) Interventions SDOH Screenings   Food Insecurity: No Food Insecurity (05/26/2024)  Housing: Low Risk  (05/26/2024)  Transportation Needs: No Transportation Needs (05/26/2024)  Utilities: Not At Risk (05/26/2024)  Financial Resource Strain: Medium Risk (12/28/2023)   Received from [facility]  Physical Activity: Unknown (12/28/2023)   Received from [facility]  Social Connections: Unknown (05/26/2024)  Stress: Stress Concern Present (12/28/2023)   Received from [facility]  Tobacco Use: Medium Risk (05/25/2024)     Readmission Risk Interventions    05/26/2024    1:44 PM  Readmission Risk Prevention Plan  Post Dischage Appt Complete  Medication Screening Complete  Transportation Screening Complete

## 2024-05-26 NOTE — Progress Notes (Signed)
 PROGRESS NOTE    Shirley Mcdonald  FMW:[id number] DOB: 06-11-1951 DOA: 05/25/2024 PCP: Sebastian Othel GAILS, FNP   Brief Narrative:  Shirley Mcdonald is a 73 y.o. 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  Unclear which specific organism is noted on this culture however given recent ESBL infection patient reports never fully recovered from would presume this is the same organism.  Care hospitalist called for admission, ID sidelined for additional assistance in antibiotic regimen.  Assessment & Plan:   Principal Problem:   UTI (urinary tract infection) Active Problems:   Neurogenic bladder   History of suprapubic catheter   Type 2 diabetes mellitus without complication, without long-term current use of insulin  (HCC)   Essential hypertension   Anxiety and depression   Gastroesophageal reflux disease   Chronic gout without tophus   UTI due to extended-spectrum beta lactamase (ESBL) producing Escherichia coli  Acute, questionably recurrent, cystitis Recent history of ESBL UTI - Infectious disease sidelined, recommending cefepime in the interim until cultures can be finalized - Recent history of ESBL infection  Hx of urinary retention Neurogenic bladder - Status post suprapubic catheter - Reports catheter was changed last week on 05/20/2024 - Continue oxybutynin   Diabetes, type II, well-controlled on diet - A1c 4.6% last year, repeat A1c pending  Lab Results  Component Value Date   HGBA1C 4.5 (L) 03/12/2023  - Patient taken off all medications previously by PCP, continues diabetic diet, follow clinically   Essential HTN - Stable on ARB  Hypothyroidism - Continue Synthroid  Anxiety and depression - Continue Lamictal , bupropion , duloxetine and  amitriptyline GERD - Continue famotidine  and Protonix  Gout - Well controlled on colchicine   DVT prophylaxis: enoxaparin  (LOVENOX ) injection 40 mg Start: 05/26/24 1000 Code Status:   Code Status: Full Code Family Communication: None present  Status is: Inpatient  Dispo: The patient is from: Home              Anticipated d/c is to: Home              Anticipated d/c date is: 24 to 48 hours pending clinical course and microbiology data/cultures              Patient currently not medically stable for discharge  Consultants:  ID  Procedures:  None  Antimicrobials:  Cefepime  Subjective: No acute issues or events overnight  Objective: Vitals:   05/26/24 0004 05/26/24 0030 05/26/24 0116 05/26/24 0645  BP:  (!) 140/88 133/60 126/65  Pulse:  84 69 66  Resp:      Temp: 98.6 F (37 C)  98.3 F (36.8 C) 98.4 F (36.9 C)  TempSrc:   Oral Oral  SpO2:  96% 97% 98%  Weight:      Height:        Intake/Output Summary (Last 24 hours) at 05/26/2024 0730 Last data filed at 05/26/2024 0600 Gross per 24 hour  Intake 341.04 ml  Output 1150 ml  Net -808.96 ml   Filed Weights   05/25/24 2021  Weight: 78 kg    Examination:  General:  Pleasantly resting in bed, No acute distress HEENT:  Normocephalic atraumatic.  Sclerae nonicteric, noninjected.  Extraocular movements intact bilaterally Neck:  Without mass or deformity.  Trachea is midline Lungs:  Clear to auscultate bilaterally without rhonchi,  wheeze, or rales Heart:  Regular rate and rhythm.  Without murmurs, rubs, or gallops Abdomen:  Soft, nontender, nondistended.  Without guarding or rebound Extremities: Without cyanosis, clubbing, edema, right AKA noted  Data Reviewed: I have personally reviewed following labs and imaging studies  CBC: Recent Labs  Lab 05/25/24 2241 05/26/24 0506  WBC 6.3 6.4  NEUTROABS 4.1  --   HGB 10.1* 10.0*  HCT 30.2* 30.8*  MCV 77.6* 79.8*  PLT 126* 109*   Basic Metabolic Panel: Recent  Labs  Lab 05/25/24 2241 05/26/24 0506  NA 131* 133*  K 4.1 4.0  CL 97* 98  CO2 23 23  GLUCOSE 149* 142*  BUN 18 16  CREATININE 0.54 0.61  CALCIUM  9.3 9.0   GFR: Estimated Creatinine Clearance: 61.5 mL/min (by C-G formula based on SCr of 0.61 mg/dL). Liver Function Tests: Recent Labs  Lab 05/25/24 2241  AST 27  ALT 21  ALKPHOS 57  BILITOT 0.7  PROT 6.4*  ALBUMIN 4.1   Recent Labs  Lab 05/25/24 2241  LIPASE 28    Recent Results (from the past 240 hours)  Resp panel by RT-PCR (RSV, Flu A&B, Covid) Anterior Nasal Swab     Status: None   Collection Time: 05/25/24  8:58 PM   Specimen: Anterior Nasal Swab  Result Value Ref Range Status   SARS Coronavirus 2 by RT PCR NEGATIVE NEGATIVE Final    Comment: (NOTE) SARS-CoV-2 target nucleic acids are NOT DETECTED.  The SARS-CoV-2 RNA is generally detectable in upper respiratory specimens during the acute phase of infection. The lowest concentration of SARS-CoV-2 viral copies this assay can detect is 138 copies/mL. A negative result does not preclude SARS-Cov-2 infection and should not be used as the sole basis for treatment or other patient management decisions. A negative result may occur with  improper specimen collection/handling, submission of specimen other than nasopharyngeal swab, presence of viral mutation(s) within the areas targeted by this assay, and inadequate number of viral copies(<138 copies/mL). A negative result must be combined with clinical observations, patient history, and epidemiological information. The expected result is Negative.  Fact Sheet for Patients:  BloggerCourse.com  Fact Sheet for Healthcare Providers:  SeriousBroker.it  This test is no t yet approved or cleared by the [place]  FDA and  has been authorized for detection and/or diagnosis of SARS-CoV-2 by FDA under an Emergency Use Authorization (EUA). This EUA will remain  in effect  (meaning this test can be used) for the duration of the COVID-19 declaration under Section 564(b)(1) of the Act, 21 U.S.C.section 360bbb-3(b)(1), unless the authorization is terminated  or revoked sooner.       Influenza A by PCR NEGATIVE NEGATIVE Final   Influenza B by PCR NEGATIVE NEGATIVE Final    Comment: (NOTE) The Xpert Xpress SARS-CoV-2/FLU/RSV plus assay is intended as an aid in the diagnosis of influenza from Nasopharyngeal swab specimens and should not be used as a sole basis for treatment. Nasal washings and aspirates are unacceptable for Xpert Xpress SARS-CoV-2/FLU/RSV testing.  Fact Sheet for Patients: BloggerCourse.com  Fact Sheet for Healthcare Providers: SeriousBroker.it  This test is not yet approved or cleared by the [place]  FDA and has been authorized for detection and/or diagnosis of SARS-CoV-2 by FDA under an Emergency Use Authorization (EUA). This EUA will remain in effect (meaning this test can be used) for the duration of the COVID-19 declaration under Section 564(b)(1) of the Act, 21 U.S.C. section 360bbb-3(b)(1), unless the authorization is terminated or revoked.  Resp Syncytial Virus by PCR NEGATIVE NEGATIVE Final    Comment: (NOTE) Fact Sheet for Patients: BloggerCourse.com  Fact Sheet for Healthcare Providers: SeriousBroker.it  This test is not yet approved or cleared by the [place]  FDA and has been authorized for detection and/or diagnosis of SARS-CoV-2 by FDA under an Emergency Use Authorization (EUA). This EUA will remain in effect (meaning this test can be used) for the duration of the COVID-19 declaration under Section 564(b)(1) of the Act, 21 U.S.C. section 360bbb-3(b)(1), unless the authorization is terminated or revoked.  Performed at [facility], [address], [place], [place] [address]       Radiology Studies: DG Chest Portable 1 View Result Date: 05/25/2024 CLINICAL DATA:  Shortness of breath. EXAM: PORTABLE CHEST 1 VIEW COMPARISON:  01/01/2023 FINDINGS: The heart is borderline enlarged. The cardiomediastinal contours are normal. The lungs are clear. Pulmonary vasculature is normal. No consolidation, pleural effusion, or pneumothorax. Bilateral shoulder arthroplasties. No acute osseous abnormalities are seen. IMPRESSION: Borderline cardiomegaly. Electronically Signed   By: Andrea Gasman M.D.   On: 05/25/2024 20:40   Scheduled Meds:  acidophilus  1 capsule Oral Daily   amitriptyline  25 mg Oral QHS   ascorbic acid  1,000 mg Oral Daily   buPROPion   150 mg Oral BID   calcium  carbonate  500 mg of elemental calcium  Oral BID WC   cholecalciferol   5,000 Units Oral Daily   colchicine   0.6 mg Oral BID   docusate sodium   250 mg Oral QHS   DULoxetine  60 mg Oral Daily   enoxaparin  (LOVENOX ) injection  40 mg Subcutaneous Q24H   famotidine   40 mg Oral Daily   irbesartan   150 mg Oral Daily   lamoTRIgine   100 mg Oral QPM   levothyroxine   50 mcg Oral Daily   loratadine   10 mg Oral QHS   oxybutynin  5 mg Oral BID   pantoprazole   40 mg Oral QHS   traZODone   100 mg Oral QHS   Continuous Infusions:  meropenem (MERREM) IV       LOS: 1 day   Time spent:  Elsie JAYSON [place], DO [facility]  If 7PM-7AM, please contact night-coverage [email or web address]  05/26/2024, 7:30 AM

## 2024-05-26 NOTE — Plan of Care (Signed)
   Problem: Clinical Measurements: Goal: Diagnostic test results will improve Outcome: Progressing

## 2024-05-26 NOTE — Progress Notes (Signed)
 NP Blondie was notified that patient is complaining of generalized pain 8/10. She was given PRN Tylenol  q 6 hours at 0132.

## 2024-05-26 NOTE — Plan of Care (Signed)
   Problem: Education: Goal: Knowledge of General Education information will improve Description Including pain rating scale, medication(s)/side effects and non-pharmacologic comfort measures Outcome: Progressing   Problem: Health Behavior/Discharge Planning: Goal: Ability to manage health-related needs will improve Outcome: Progressing

## 2024-05-27 DIAGNOSIS — Z9889 Other specified postprocedural states: Secondary | ICD-10-CM | POA: Diagnosis not present

## 2024-05-27 DIAGNOSIS — K219 Gastro-esophageal reflux disease without esophagitis: Secondary | ICD-10-CM | POA: Diagnosis not present

## 2024-05-27 DIAGNOSIS — F419 Anxiety disorder, unspecified: Secondary | ICD-10-CM | POA: Diagnosis not present

## 2024-05-27 DIAGNOSIS — N3000 Acute cystitis without hematuria: Secondary | ICD-10-CM | POA: Diagnosis not present

## 2024-05-27 LAB — CBC
HCT: 34.8 % — ABNORMAL LOW (ref 36.0–46.0)
Hemoglobin: 11.4 g/dL — ABNORMAL LOW (ref 12.0–15.0)
MCH: 25.9 pg — ABNORMAL LOW (ref 26.0–34.0)
MCHC: 32.8 g/dL (ref 30.0–36.0)
MCV: 79.1 fL — ABNORMAL LOW (ref 80.0–100.0)
Platelets: 137 K/uL — ABNORMAL LOW (ref 150–400)
RBC: 4.4 MIL/uL (ref 3.87–5.11)
RDW: 16.2 % — ABNORMAL HIGH (ref 11.5–15.5)
WBC: 5.2 K/uL (ref 4.0–10.5)
nRBC: 0 % (ref 0.0–0.2)

## 2024-05-27 LAB — BASIC METABOLIC PANEL WITH GFR
Anion gap: 13 (ref 5–15)
BUN: 13 mg/dL (ref 8–23)
CO2: 22 mmol/L (ref 22–32)
Calcium: 9.7 mg/dL (ref 8.9–10.3)
Chloride: 104 mmol/L (ref 98–111)
Creatinine, Ser: 0.59 mg/dL (ref 0.44–1.00)
GFR, Estimated: >60 mL/min (ref >60)
Glucose, Bld: 158 mg/dL — ABNORMAL HIGH (ref 70–99)
Potassium: 3.7 mmol/L (ref 3.5–5.1)
Sodium: 139 mmol/L (ref 135–145)

## 2024-05-27 LAB — GLUCOSE, CAPILLARY: Glucose-Capillary: 172 mg/dL — ABNORMAL HIGH (ref 70–99)

## 2024-05-27 LAB — MRSA NEXT GEN BY PCR, NASAL: MRSA by PCR Next Gen: DETECTED — AB

## 2024-05-27 MED ORDER — TRAMADOL HCL 50 MG PO TABS
50.0000 mg | ORAL_TABLET | Freq: Four times a day (QID) | ORAL | Status: DC | PRN
  Administered 2024-05-27 – 2024-05-28 (×4): 50 mg via ORAL
  Filled 2024-05-27 (×4): qty 1

## 2024-05-27 MED ORDER — IBUPROFEN 400 MG PO TABS
400.0000 mg | ORAL_TABLET | Freq: Two times a day (BID) | ORAL | Status: DC
  Administered 2024-05-27 – 2024-05-30 (×6): 400 mg via ORAL
  Filled 2024-05-27 (×6): qty 1

## 2024-05-27 MED ORDER — ACETAMINOPHEN 500 MG PO TABS
500.0000 mg | ORAL_TABLET | Freq: Two times a day (BID) | ORAL | Status: DC
  Administered 2024-05-27 – 2024-05-28 (×2): 500 mg via ORAL
  Filled 2024-05-27 (×2): qty 1

## 2024-05-27 MED ORDER — INSULIN ASPART 100 UNIT/ML IJ SOLN
0.0000 [IU] | Freq: Three times a day (TID) | INTRAMUSCULAR | Status: DC
  Administered 2024-05-28: 1 [IU] via SUBCUTANEOUS
  Administered 2024-05-28 – 2024-05-29 (×3): 2 [IU] via SUBCUTANEOUS

## 2024-05-27 MED ORDER — OXYCODONE HCL 5 MG PO TABS: 5.0000 mg | ORAL_TABLET | ORAL | Status: DC | PRN

## 2024-05-27 MED ORDER — HYDROXYZINE HCL 25 MG PO TABS: 25.0000 mg | ORAL_TABLET | Freq: Three times a day (TID) | ORAL | Status: DC | PRN

## 2024-05-27 NOTE — Progress Notes (Signed)
 PROGRESS NOTE    Shirley Mcdonald  FMW:[id number] DOB: 08/03/1951 DOA: 05/25/2024 PCP: Sebastian Othel GAILS, FNP   Chief Complaint  Patient presents with   positive blood cultures    Brief Narrative:  Shirley Mcdonald is a 73 y.o. 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  Unclear which specific organism is noted on this culture however given recent ESBL infection patient reports never fully recovered from would presume this is the same organism.  Care hospitalist called for admission, ID sidelined for additional assistance in antibiotic regimen.    Assessment & Plan:   Principal Problem:   UTI (urinary tract infection) Active Problems:   Neurogenic bladder   History of suprapubic catheter   Type 2 diabetes mellitus without complication, without long-term current use of insulin  (HCC)   Essential hypertension   Anxiety and depression   Gastroesophageal reflux disease   Chronic gout without tophus   UTI due to extended-spectrum beta lactamase (ESBL) producing Escherichia coli  #1 acute recurrent cystitis/recent history of ESBL UTI -Patient noted to have presented with concerns for UTI. - Patient with recent history of ESBL infection. - Urine cultures ordered and pending. - Patient initially was on Maxipime however transition to cefepime pending culture results after Dr. Lue curb sided ID. - Supportive care.  2.  History of urinary retention/neurogenic bladder -Status post suprapubic catheter. - It is noted the suprapubic catheter was changed last week 05/20/2024. - Continue oxybutynin.  3.  Diabetes mellitus type 2, well-controlled on diet -Hemoglobin A1c 7.2. - Check CBGs AC and at bedtime. - It is noted that patient taken off all her medications  previously by PCP and diabetes being managed by diabetic diet. - Place on SSI while in-house. - Outpatient follow-up with PCP.  4.  Hypertension -Currently on Avapro .  5.  Hypothyroidism -Synthroid .  6.  Depression/anxiety -Continue Lamictal , bupropion , duloxetine, amitriptyline.  7.  GERD -Continue famotidine , Protonix .  8.  Gout -Colchicine .   DVT prophylaxis: Lovenox  Code Status: Full Family Communication: Updated patient.  No family at bedside. Disposition: Likely back to facility once urine cultures are finalized and patient able to be transition to oral antibiotics.  Status is: Inpatient Remains inpatient appropriate because: Severity of illness   Consultants:  None  Procedures:  Chest x-ray 05/25/2024   Antimicrobials:  Anti-infectives (From admission, onward)    Start     Dose/Rate Route Frequency Ordered Stop   05/26/24 1600  ceFEPIme (MAXIPIME) 2 g in sodium chloride  0.9 % 100 mL IVPB        2 g 200 mL/hr over 30 Minutes Intravenous Every 12 hours 05/26/24 0954     05/26/24 0900  meropenem (MERREM) 1 g in sodium chloride  0.9 % 100 mL IVPB  Status:  Discontinued        1 g 200 mL/hr over 30 Minutes Intravenous Every 8 hours 05/26/24 0159 05/26/24 0954   05/25/24 2230  meropenem (MERREM) 1 g in sodium chloride  0.9 % 100 mL IVPB        1 g 200 mL/hr over 30 Minutes Intravenous  Once 05/25/24 2225 05/26/24 0107         Subjective: Patient laying in bed.  Patient with complaints of back pain.  Denies any chest pain or shortness of breath.  No abdominal  pain.  Objective: Vitals:   05/26/24 1247 05/26/24 2204 05/27/24 0606 05/27/24 1400  BP: 107/81 118/63 106/69 99/77  Pulse: 77 90 98 96  Resp: 16 17 16 18   Temp: 98.3 F (36.8 C) 98.5 F (36.9 C) 98.6 F (37 C) 98.2 F (36.8 C)  TempSrc: Oral Oral  Oral  SpO2: 96% 95% 92% 98%  Weight:      Height:        Intake/Output Summary (Last 24 hours) at 05/27/2024 1846 Last data filed at 05/27/2024  1800 Gross per 24 hour  Intake 1697.92 ml  Output 3400 ml  Net -1702.08 ml   Filed Weights   05/25/24 2021  Weight: 78 kg    Examination:  General exam: Appears calm and comfortable  Respiratory system: Clear to auscultation.  No wheezes, no crackles, no rhonchi.  Fair air movement.  Speaking in full sentences.  Respiratory effort normal. Cardiovascular system: S1 & S2 heard, RRR. No JVD, murmurs, rubs, gallops or clicks. No pedal edema. Gastrointestinal system: Abdomen is nondistended, soft and nontender. No organomegaly or masses felt. Normal bowel sounds heard. Central nervous system: Alert and oriented. No focal neurological deficits. Extremities: Status post right AKA.  Skin: No rashes, lesions or ulcers Psychiatry: Judgement and insight appear normal. Mood & affect appropriate.     Data Reviewed: I have personally reviewed following labs and imaging studies  CBC: Recent Labs  Lab 05/25/24 2241 05/26/24 0506 05/27/24 0507  WBC 6.3 6.4 5.2  NEUTROABS 4.1  --   --   HGB 10.1* 10.0* 11.4*  HCT 30.2* 30.8* 34.8*  MCV 77.6* 79.8* 79.1*  PLT 126* 109* 137*    Basic Metabolic Panel: Recent Labs  Lab 05/25/24 2241 05/26/24 0506 05/27/24 0507  NA 131* 133* 139  K 4.1 4.0 3.7  CL 97* 98 104  CO2 23 23 22   GLUCOSE 149* 142* 158*  BUN 18 16 13   CREATININE 0.54 0.61 0.59  CALCIUM  9.3 9.0 9.7    GFR: Estimated Creatinine Clearance: 61.5 mL/min (by C-G formula based on SCr of 0.59 mg/dL).  Liver Function Tests: Recent Labs  Lab 05/25/24 2241  AST 27  ALT 21  ALKPHOS 57  BILITOT 0.7  PROT 6.4*  ALBUMIN 4.1    CBG: No results for input(s): GLUCAP in the last 168 hours.   Recent Results (from the past 240 hours)  Resp panel by RT-PCR (RSV, Flu A&B, Covid) Anterior Nasal Swab     Status: None   Collection Time: 05/25/24  8:58 PM   Specimen: Anterior Nasal Swab  Result Value Ref Range Status   SARS Coronavirus 2 by RT PCR NEGATIVE NEGATIVE Final     Comment: (NOTE) SARS-CoV-2 target nucleic acids are NOT DETECTED.  The SARS-CoV-2 RNA is generally detectable in upper respiratory specimens during the acute phase of infection. The lowest concentration of SARS-CoV-2 viral copies this assay can detect is 138 copies/mL. A negative result does not preclude SARS-Cov-2 infection and should not be used as the sole basis for treatment or other patient management decisions. A negative result may occur with  improper specimen collection/handling, submission of specimen other than nasopharyngeal swab, presence of viral mutation(s) within the areas targeted by this assay, and inadequate number of viral copies(<138 copies/mL). A negative result must be combined with clinical observations, patient history, and epidemiological information. The expected result is Negative.  Fact Sheet for Patients:  BloggerCourse.com  Fact Sheet for Healthcare Providers:  SeriousBroker.it  This test is no  t yet approved or cleared by the [place]  FDA and  has been authorized for detection and/or diagnosis of SARS-CoV-2 by FDA under an Emergency Use Authorization (EUA). This EUA will remain  in effect (meaning this test can be used) for the duration of the COVID-19 declaration under Section 564(b)(1) of the Act, 21 U.S.C.section 360bbb-3(b)(1), unless the authorization is terminated  or revoked sooner.       Influenza A by PCR NEGATIVE NEGATIVE Final   Influenza B by PCR NEGATIVE NEGATIVE Final    Comment: (NOTE) The Xpert Xpress SARS-CoV-2/FLU/RSV plus assay is intended as an aid in the diagnosis of influenza from Nasopharyngeal swab specimens and should not be used as a sole basis for treatment. Nasal washings and aspirates are unacceptable for Xpert Xpress SARS-CoV-2/FLU/RSV testing.  Fact Sheet for Patients: BloggerCourse.com  Fact Sheet for Healthcare  Providers: SeriousBroker.it  This test is not yet approved or cleared by the [place]  FDA and has been authorized for detection and/or diagnosis of SARS-CoV-2 by FDA under an Emergency Use Authorization (EUA). This EUA will remain in effect (meaning this test can be used) for the duration of the COVID-19 declaration under Section 564(b)(1) of the Act, 21 U.S.C. section 360bbb-3(b)(1), unless the authorization is terminated or revoked.     Resp Syncytial Virus by PCR NEGATIVE NEGATIVE Final    Comment: (NOTE) Fact Sheet for Patients: BloggerCourse.com  Fact Sheet for Healthcare Providers: SeriousBroker.it  This test is not yet approved or cleared by the [place]  FDA and has been authorized for detection and/or diagnosis of SARS-CoV-2 by FDA under an Emergency Use Authorization (EUA). This EUA will remain in effect (meaning this test can be used) for the duration of the COVID-19 declaration under Section 564(b)(1) of the Act, 21 U.S.C. section 360bbb-3(b)(1), unless the authorization is terminated or revoked.  Performed at [facility], [address], [place], [place] [address]   Blood culture (routine x 2)     Status: None (Preliminary result)   Collection Time: 05/25/24 10:41 PM   Specimen: BLOOD LEFT FOREARM  Result Value Ref Range Status   Specimen Description   Final    BLOOD LEFT FOREARM Performed at [facility], [address] [place], [place] [address]    Special Requests   Final    Blood Culture adequate volume BOTTLES DRAWN AEROBIC AND ANAEROBIC Performed at [facility], [address], [place], [place] [address]    Culture   Final    NO GROWTH 1 DAY Performed at [facility], [address], [place], [place] [address]    Report Status PENDING  Incomplete  Urine Culture (for pregnant, neutropenic or urologic patients or  patients with an indwelling urinary catheter)     Status: Abnormal (Preliminary result)   Collection Time: 05/25/24 11:24 PM   Specimen: Urine, Catheterized  Result Value Ref Range Status   Specimen Description   Final    URINE, CATHETERIZED Performed at [facility], [address] [place], [place] [address]    Special Requests   Final    NONE Performed at [facility], [address], [place], [place] [address]    Culture (A)  Final    >=100,000 COLONIES/mL SERRATIA MARCESCENS 60,000 COLONIES/mL MORGANELLA MORGANII CULTURE REINCUBATED FOR BETTER GROWTH Performed at [facility], [address], [place], [place] [address]    Report Status PENDING  Incomplete  Blood culture (routine x 2)  Status: None (Preliminary result)   Collection Time: 05/26/24  5:06 AM   Specimen: BLOOD RIGHT ARM  Result Value Ref Range Status   Specimen Description   Final    BLOOD RIGHT ARM Performed at [facility], [address] [place], [place] [address]    Special Requests   Final    BOTTLES DRAWN AEROBIC ONLY Blood Culture results may not be optimal due to an inadequate volume of blood received in culture bottles Performed at [facility], [address], [place], [place] [address]    Culture   Final    NO GROWTH 1 DAY Performed at [facility], [address], [place], [place] [address]    Report Status PENDING  Incomplete  MRSA Next Gen by PCR, Nasal     Status: Abnormal   Collection Time: 05/26/24  1:14 PM   Specimen: Nasal Mucosa; Nasal Swab  Result Value Ref Range Status   MRSA by PCR Next Gen DETECTED (A) NOT DETECTED Final    Comment: (NOTE) The GeneXpert MRSA Assay (FDA approved for NASAL specimens only), is one component of a comprehensive MRSA colonization surveillance program. It is not intended to diagnose MRSA infection nor to guide or monitor treatment for MRSA infections. Test performance is not  FDA approved in patients less than 16 years old. Performed at [facility], [address] [place], [place] [address]          Radiology Studies: DG Chest Portable 1 View Result Date: 05/25/2024 CLINICAL DATA:  Shortness of breath. EXAM: PORTABLE CHEST 1 VIEW COMPARISON:  01/01/2023 FINDINGS: The heart is borderline enlarged. The cardiomediastinal contours are normal. The lungs are clear. Pulmonary vasculature is normal. No consolidation, pleural effusion, or pneumothorax. Bilateral shoulder arthroplasties. No acute osseous abnormalities are seen. IMPRESSION: Borderline cardiomegaly. Electronically Signed   By: Andrea Gasman M.D.   On: 05/25/2024 20:40        Scheduled Meds:  acetaminophen   500 mg Oral BID   acidophilus  1 capsule Oral Daily   amitriptyline  25 mg Oral QHS   ascorbic acid  1,000 mg Oral Daily   buPROPion   150 mg Oral BID   calcium  carbonate  500 mg of elemental calcium  Oral BID WC   cholecalciferol   5,000 Units Oral Daily   colchicine   0.6 mg Oral BID   docusate sodium   250 mg Oral QHS   DULoxetine  60 mg Oral Daily   enoxaparin  (LOVENOX ) injection  40 mg Subcutaneous Q24H   famotidine   40 mg Oral Daily   ibuprofen   400 mg Oral BID   irbesartan   150 mg Oral Daily   lamoTRIgine   100 mg Oral QPM   levothyroxine   50 mcg Oral Daily   loratadine   10 mg Oral QHS   oxybutynin  5 mg Oral BID   pantoprazole   40 mg Oral QHS   traZODone   100 mg Oral QHS   Continuous Infusions:  ceFEPime (MAXIPIME) IV 2 g (05/27/24 1531)     LOS: 2 days    Time spent: 40 minutes    Toribio Hummer, MD [facility]   To contact the attending provider between 7A-7P or the covering provider during after hours 7P-7A, please log into the web site [email or web address] and access using universal [place] password for that web site. If you do not have the password, please call the hospital operator.  05/27/2024, 6:46 PM

## 2024-05-27 NOTE — Plan of Care (Signed)
   Problem: Clinical Measurements: Goal: Will remain free from infection Outcome: Progressing Goal: Diagnostic test results will improve Outcome: Progressing

## 2024-05-27 NOTE — Plan of Care (Signed)

## 2024-05-28 DIAGNOSIS — N3000 Acute cystitis without hematuria: Secondary | ICD-10-CM | POA: Diagnosis not present

## 2024-05-28 DIAGNOSIS — Z9889 Other specified postprocedural states: Secondary | ICD-10-CM | POA: Diagnosis not present

## 2024-05-28 DIAGNOSIS — K219 Gastro-esophageal reflux disease without esophagitis: Secondary | ICD-10-CM | POA: Diagnosis not present

## 2024-05-28 DIAGNOSIS — F419 Anxiety disorder, unspecified: Secondary | ICD-10-CM | POA: Diagnosis not present

## 2024-05-28 LAB — GLUCOSE, CAPILLARY
Glucose-Capillary: 134 mg/dL — ABNORMAL HIGH (ref 70–99)
Glucose-Capillary: 197 mg/dL — ABNORMAL HIGH (ref 70–99)
Glucose-Capillary: 176 mg/dL — ABNORMAL HIGH (ref 70–99)
Glucose-Capillary: 135 mg/dL — ABNORMAL HIGH (ref 70–99)

## 2024-05-28 MED ORDER — IRBESARTAN 75 MG PO TABS
75.0000 mg | ORAL_TABLET | Freq: Every day | ORAL | Status: DC
  Filled 2024-05-28 (×2): qty 1

## 2024-05-28 MED ORDER — BENZONATATE 100 MG PO CAPS
200.0000 mg | ORAL_CAPSULE | Freq: Three times a day (TID) | ORAL | Status: DC
  Administered 2024-05-28 – 2024-05-30 (×6): 200 mg via ORAL
  Filled 2024-05-28 (×6): qty 2

## 2024-05-28 MED ORDER — ACETAMINOPHEN 500 MG PO TABS
1000.0000 mg | ORAL_TABLET | Freq: Three times a day (TID) | ORAL | Status: DC
  Administered 2024-05-28 – 2024-05-30 (×5): 1000 mg via ORAL
  Filled 2024-05-28 (×5): qty 2

## 2024-05-28 NOTE — Progress Notes (Addendum)
 PROGRESS NOTE    Shirley Mcdonald  FMW:[id number] DOB: [birth year] DOA: 05/25/2024 PCP: Sebastian Othel GAILS, FNP   Chief Complaint  Patient presents with   positive blood cultures    Brief Narrative:  Shirley Mcdonald is a 73 y.o. 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  Unclear which specific organism is noted on this culture however given recent ESBL infection patient reports never fully recovered from would presume this is the same organism.  Care hospitalist called for admission, ID sidelined for additional assistance in antibiotic regimen.    Assessment & Plan:   Principal Problem:   UTI (urinary tract infection) Active Problems:   Neurogenic bladder   History of suprapubic catheter   Type 2 diabetes mellitus without complication, without long-term current use of insulin  (HCC)   Essential hypertension   Anxiety and depression   Gastroesophageal reflux disease   Chronic gout without tophus   UTI due to extended-spectrum beta lactamase (ESBL) producing Escherichia coli  #1 acute recurrent cystitis/recent history of ESBL UTI -Patient noted to have presented with concerns for UTI. - Patient with recent history of ESBL infection. - Urine cultures preliminary results with > 100,000 colonies of Serratia marcescens, 60,000 colonies of Morganella morganii with sensitivities pending. - Patient initially was on Maxipime however transitioned to cefepime pending culture results after Dr. Lue curb sided ID. - Supportive care.  2.  History of urinary retention/neurogenic bladder -Status post suprapubic catheter. - It is noted the suprapubic catheter was changed on 05/20/2024. - Continue oxybutynin.  3.  Diabetes mellitus type 2, well-controlled on  diet -Hemoglobin A1c 7.2. - CBG noted in 134 this morning.   - It is noted that patient taken off all her medications previously by PCP and diabetes being managed by diabetic diet. - SSI.   - Outpatient follow-up with PCP.    4.  Hypertension - Decrease Avapro  to 75 mg daily.   5.  Hypothyroidism -Synthroid .  6.  Depression/anxiety -Continue Lamictal , bupropion , duloxetine, amitriptyline.  7.  GERD - Protonix , famotidine .   8.  Gout - Continue colchicine .  9.  Back pain -Place heating pad. - Continue scheduled ibuprofen . - Increase scheduled Tylenol  to 1000 mg 3 times daily.   DVT prophylaxis: Lovenox  Code Status: Full Family Communication: Updated patient.  No family at bedside. Disposition: Likely back to facility once urine cultures are finalized and patient able to be transition to oral antibiotics.  Status is: Inpatient Remains inpatient appropriate because: Severity of illness   Consultants:  None  Procedures:  Chest x-ray 05/25/2024   Antimicrobials:  Anti-infectives (From admission, onward)    Start     Dose/Rate Route Frequency Ordered Stop   05/26/24 1600  ceFEPIme (MAXIPIME) 2 g in sodium chloride  0.9 % 100 mL IVPB        2 g 200 mL/hr over 30 Minutes Intravenous Every 12 hours 05/26/24 0954     05/26/24 0900  meropenem (MERREM) 1 g in sodium chloride  0.9 % 100 mL IVPB  Status:  Discontinued        1 g 200 mL/hr over 30 Minutes Intravenous Every 8 hours 05/26/24 0159 05/26/24 0954   05/25/24 2230  meropenem (MERREM) 1 g in sodium chloride  0.9 % 100 mL IVPB        1  g 200 mL/hr over 30 Minutes Intravenous  Once 05/25/24 2225 05/26/24 0107         Subjective: Patient sitting up in chair.  Still with complaints of back pain.  Denies any chest pain or shortness of breath.  No abdominal pain.   Objective: Vitals:   05/27/24 1400 05/27/24 1940 05/28/24 0627 05/28/24 1133  BP: 99/77 117/66 106/62 97/69  Pulse: 96 85 72 64  Resp: 18 17 16 16    Temp: 98.2 F (36.8 C) 98.4 F (36.9 C) 97.7 F (36.5 C) 98.2 F (36.8 C)  TempSrc: Oral Oral Oral Oral  SpO2: 98% 97% 95% 98%  Weight:      Height:        Intake/Output Summary (Last 24 hours) at 05/28/2024 1331 Last data filed at 05/28/2024 1000 Gross per 24 hour  Intake 1390 ml  Output 2200 ml  Net -810 ml   Filed Weights   05/25/24 2021  Weight: 78 kg    Examination:  General exam: NAD.  Respiratory system: CTAB.  No wheezes, no crackles, no rhonchi.  Fair air movement.  Speaking in full sentences.  Cardiovascular system: Regular rate and rhythm no murmurs rubs or gallops.  No JVD.  No lower extremity edema.  Gastrointestinal system: Abdomen soft, nontender, nondistended, positive bowel sound.  No rebound.  No guarding. Central nervous system: Alert and oriented. No focal neurological deficits. Extremities: Status post right AKA.  Skin: No rashes, lesions or ulcers Psychiatry: Judgement and insight appear normal. Mood & affect appropriate.     Data Reviewed: I have personally reviewed following labs and imaging studies  CBC: Recent Labs  Lab 05/25/24 2241 05/26/24 0506 05/27/24 0507  WBC 6.3 6.4 5.2  NEUTROABS 4.1  --   --   HGB 10.1* 10.0* 11.4*  HCT 30.2* 30.8* 34.8*  MCV 77.6* 79.8* 79.1*  PLT 126* 109* 137*    Basic Metabolic Panel: Recent Labs  Lab 05/25/24 2241 05/26/24 0506 05/27/24 0507  NA 131* 133* 139  K 4.1 4.0 3.7  CL 97* 98 104  CO2 23 23 22   GLUCOSE 149* 142* 158*  BUN 18 16 13   CREATININE 0.54 0.61 0.59  CALCIUM  9.3 9.0 9.7    GFR: Estimated Creatinine Clearance: 61.5 mL/min (by C-G formula based on SCr of 0.59 mg/dL).  Liver Function Tests: Recent Labs  Lab 05/25/24 2241  AST 27  ALT 21  ALKPHOS 57  BILITOT 0.7  PROT 6.4*  ALBUMIN 4.1    CBG: Recent Labs  Lab 05/27/24 2115 05/28/24 0742 05/28/24 1127  GLUCAP 172* 134* 197*     Recent Results (from the past 240 hours)  Resp panel by RT-PCR (RSV, Flu A&B,  Covid) Anterior Nasal Swab     Status: None   Collection Time: 05/25/24  8:58 PM   Specimen: Anterior Nasal Swab  Result Value Ref Range Status   SARS Coronavirus 2 by RT PCR NEGATIVE NEGATIVE Final    Comment: (NOTE) SARS-CoV-2 target nucleic acids are NOT DETECTED.  The SARS-CoV-2 RNA is generally detectable in upper respiratory specimens during the acute phase of infection. The lowest concentration of SARS-CoV-2 viral copies this assay can detect is 138 copies/mL. A negative result does not preclude SARS-Cov-2 infection and should not be used as the sole basis for treatment or other patient management decisions. A negative result may occur with  improper specimen collection/handling, submission of specimen other than nasopharyngeal swab, presence of viral mutation(s) within the areas targeted by this  assay, and inadequate number of viral copies(<138 copies/mL). A negative result must be combined with clinical observations, patient history, and epidemiological information. The expected result is Negative.  Fact Sheet for Patients:  BloggerCourse.com  Fact Sheet for Healthcare Providers:  SeriousBroker.it  This test is no t yet approved or cleared by the [place]  FDA and  has been authorized for detection and/or diagnosis of SARS-CoV-2 by FDA under an Emergency Use Authorization (EUA). This EUA will remain  in effect (meaning this test can be used) for the duration of the COVID-19 declaration under Section 564(b)(1) of the Act, 21 U.S.C.section 360bbb-3(b)(1), unless the authorization is terminated  or revoked sooner.       Influenza A by PCR NEGATIVE NEGATIVE Final   Influenza B by PCR NEGATIVE NEGATIVE Final    Comment: (NOTE) The Xpert Xpress SARS-CoV-2/FLU/RSV plus assay is intended as an aid in the diagnosis of influenza from Nasopharyngeal swab specimens and should not be used as a sole basis for treatment. Nasal  washings and aspirates are unacceptable for Xpert Xpress SARS-CoV-2/FLU/RSV testing.  Fact Sheet for Patients: BloggerCourse.com  Fact Sheet for Healthcare Providers: SeriousBroker.it  This test is not yet approved or cleared by the [place]  FDA and has been authorized for detection and/or diagnosis of SARS-CoV-2 by FDA under an Emergency Use Authorization (EUA). This EUA will remain in effect (meaning this test can be used) for the duration of the COVID-19 declaration under Section 564(b)(1) of the Act, 21 U.S.C. section 360bbb-3(b)(1), unless the authorization is terminated or revoked.     Resp Syncytial Virus by PCR NEGATIVE NEGATIVE Final    Comment: (NOTE) Fact Sheet for Patients: BloggerCourse.com  Fact Sheet for Healthcare Providers: SeriousBroker.it  This test is not yet approved or cleared by the [place]  FDA and has been authorized for detection and/or diagnosis of SARS-CoV-2 by FDA under an Emergency Use Authorization (EUA). This EUA will remain in effect (meaning this test can be used) for the duration of the COVID-19 declaration under Section 564(b)(1) of the Act, 21 U.S.C. section 360bbb-3(b)(1), unless the authorization is terminated or revoked.  Performed at [facility], [address] [place], [place] [address]   Blood culture (routine x 2)     Status: None (Preliminary result)   Collection Time: 05/25/24 10:41 PM   Specimen: BLOOD LEFT FOREARM  Result Value Ref Range Status   Specimen Description   Final    BLOOD LEFT FOREARM Performed at [facility], [address], [place], [place] [address]    Special Requests   Final    Blood Culture adequate volume BOTTLES DRAWN AEROBIC AND ANAEROBIC Performed at [facility], [address] [place], [place] [address]    Culture   Final    NO GROWTH 2  DAYS Performed at [facility], [address], [place], [place] [address]    Report Status PENDING  Incomplete  Urine Culture (for pregnant, neutropenic or urologic patients or patients with an indwelling urinary catheter)     Status: Abnormal (Preliminary result)   Collection Time: 05/25/24 11:24 PM   Specimen: Urine, Catheterized  Result Value Ref Range Status   Specimen Description   Final    URINE, CATHETERIZED Performed at [facility], [address], [place], [place] [address]    Special Requests   Final    NONE Performed at [facility], [address] [place], [place] [address]  Culture (A)  Final    >=100,000 COLONIES/mL SERRATIA MARCESCENS 60,000 COLONIES/mL MORGANELLA MORGANII SUSCEPTIBILITIES TO FOLLOW Performed at [facility], [address] [place], [place] [address]    Report Status PENDING  Incomplete  Blood culture (routine x 2)     Status: None (Preliminary result)   Collection Time: 05/26/24  5:06 AM   Specimen: BLOOD RIGHT ARM  Result Value Ref Range Status   Specimen Description   Final    BLOOD RIGHT ARM Performed at [facility], [address] [place], [place] [address]    Special Requests   Final    BOTTLES DRAWN AEROBIC ONLY Blood Culture results may not be optimal due to an inadequate volume of blood received in culture bottles Performed at [facility], [address], [place], [place] [address]    Culture   Final    NO GROWTH 2 DAYS Performed at [facility], [address] [place], [place] [address]    Report Status PENDING  Incomplete  MRSA Next Gen by PCR, Nasal     Status: Abnormal   Collection Time: 05/26/24  1:14 PM   Specimen: Nasal Mucosa; Nasal Swab  Result Value Ref Range Status   MRSA by PCR Next Gen DETECTED (A) NOT DETECTED Final    Comment: (NOTE) The GeneXpert MRSA Assay (FDA approved for NASAL specimens only), is one component of a  comprehensive MRSA colonization surveillance program. It is not intended to diagnose MRSA infection nor to guide or monitor treatment for MRSA infections. Test performance is not FDA approved in patients less than 62 years old. Performed at [facility], [address], [place], [place] [address]          Radiology Studies: No results found.       Scheduled Meds:  acetaminophen   500 mg Oral BID   acidophilus  1 capsule Oral Daily   amitriptyline  25 mg Oral QHS   ascorbic acid  1,000 mg Oral Daily   buPROPion   150 mg Oral BID   calcium  carbonate  500 mg of elemental calcium  Oral BID WC   cholecalciferol   5,000 Units Oral Daily   colchicine   0.6 mg Oral BID   docusate sodium   250 mg Oral QHS   DULoxetine  60 mg Oral Daily   enoxaparin  (LOVENOX ) injection  40 mg Subcutaneous Q24H   famotidine   40 mg Oral Daily   ibuprofen   400 mg Oral BID   insulin  aspart  0-9 Units Subcutaneous TID WC   irbesartan   150 mg Oral Daily   lamoTRIgine   100 mg Oral QPM   levothyroxine   50 mcg Oral Daily   loratadine   10 mg Oral QHS   oxybutynin  5 mg Oral BID   pantoprazole   40 mg Oral QHS   traZODone   100 mg Oral QHS   Continuous Infusions:  ceFEPime (MAXIPIME) IV 2 g (05/28/24 0344)     LOS: 3 days    Time spent: 40 minutes    Toribio Hummer, MD [facility]   To contact the attending provider between 7A-7P or the covering provider during after hours 7P-7A, please log into the web site [email or web address] and access using universal [place] password for that web site. If you do not have the password, please call the hospital operator.  05/28/2024, 1:31 PM

## 2024-05-28 NOTE — Evaluation (Signed)
 Physical Therapy Evaluation Patient Details Name: Shirley Mcdonald MRN: [id number] DOB: May 26, 1951 Today's Date: 05/28/2024  History of Present Illness  Shirley Mcdonald is a 73 y.o.was sent by PCP to the ED for IV antibiotics due to positive urine culture -prior history of ESBL per outpatient report. 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 Patient guides therapist to place recliner perpendicular to bed. Patient demonstrated stand pivot transfer to and from recliner.    Patient resides in Independent living and is modified independent.  Patient plans to return to facility.  Patient reports there is PT available in facility if needed. Pt admitted with above diagnosis.  Pt currently with functional limitations due to the deficits listed below (see PT Problem List). Pt will benefit from acute skilled PT to increase their independence and safety with mobility to allow discharge.         If plan is discharge home, recommend the following: A little help with walking and/or transfers   Can travel by private vehicle        Equipment Recommendations None recommended by PT  Recommendations for Other Services       Functional Status Assessment Patient has had a recent decline in their functional status and demonstrates the ability to make significant improvements in function in a reasonable and predictable amount of time.     Precautions / Restrictions Precautions Precautions: Fall Restrictions Weight Bearing Restrictions Per Provider Order: No      Mobility  Bed Mobility Overal bed mobility: Needs Assistance Bed Mobility: Supine to Sit, Sit to Supine     Supine to sit: Independent Sit to supine: Independent        Transfers Overall transfer level: Modified independent                  General transfer comment: stand pivot to recliner and back to bed, chair is perpendicular to bed    Ambulation/Gait                  Stairs            Wheelchair Mobility     Tilt Bed    Modified Rankin (Stroke Patients Only)       Balance Overall balance assessment: Mild deficits observed, not formally tested                                           Pertinent Vitals/Pain Pain Assessment Pain Assessment: Faces Faces Pain Scale: Hurts little more Pain Location: shoulders pulling self up in bed Pain Descriptors / Indicators: Discomfort Pain Intervention(s): Monitored during session    Home Living Family/patient expects to be discharged to:: Private residence Living Arrangements: Alone   Type of Home: Independent living facility Home Access: Level entry       Home Layout: One level Home Equipment: Wheelchair - power      Prior Function Prior Level of Function : Independent/Modified Independent             Mobility Comments: performs stand pivot transfer to  [facility], toilet and Shower ADLs Comments: mod I for ADL's, self tasks, takes 3 meals in Dining rm, [place] via facility bus or car transfers w/ family  Extremity/Trunk Assessment   Upper Extremity Assessment Upper Extremity Assessment: Overall WFL for tasks assessed    Lower Extremity Assessment Lower Extremity Assessment: RLE deficits/detail RLE Deficits / Details: high AKA RLE Sensation: WNL       Communication   Communication Communication: No apparent difficulties    Cognition Arousal: Alert Behavior During Therapy: WFL for tasks assessed/performed   PT - Cognitive impairments: No apparent impairments                         Following commands: Intact       Cueing Cueing Techniques: Verbal cues     General Comments      Exercises     Assessment/Plan    PT Assessment Patient needs continued PT services  PT  Problem List Decreased strength;Decreased activity tolerance;Decreased mobility;Pain       PT Treatment Interventions DME instruction;Therapeutic exercise;Functional mobility training;Therapeutic activities;Patient/family education    PT Goals (Current goals can be found in the Care Plan section)  Acute Rehab PT Goals Patient Stated Goal: go home PT Goal Formulation: With patient Time For Goal Achievement: 06/11/24 Potential to Achieve Goals: Good    Frequency Min 3X/week     Co-evaluation               AM-PAC PT 6 Clicks Mobility  Outcome Measure Help needed turning from your back to your side while in a flat bed without using bedrails?: None Help needed moving from lying on your back to sitting on the side of a flat bed without using bedrails?: None Help needed moving to and from a bed to a chair (including a wheelchair)?: A Little Help needed standing up from a chair using your arms (e.g., wheelchair or bedside chair)?: A Little Help needed to walk in hospital room?: Total Help needed climbing 3-5 steps with a railing? : Total 6 Click Score: 16    End of Session   Activity Tolerance: Patient tolerated treatment well Patient left: in bed;with call bell/phone within reach Nurse Communication: Mobility status PT Visit Diagnosis: Unsteadiness on feet (R26.81)    Time: 8373-8360 PT Time Calculation (min) (ACUTE ONLY): 13 min   Charges:   PT Evaluation $PT Eval Low Complexity: 1 Low   PT General Charges $$ ACUTE PT VISIT: 1 Visit         Darice Potters PT Acute Rehabilitation Services Office [phone]   Potters Darice Norris 05/28/2024, 4:49 PM

## 2024-05-29 DIAGNOSIS — N3000 Acute cystitis without hematuria: Secondary | ICD-10-CM | POA: Diagnosis not present

## 2024-05-29 DIAGNOSIS — F419 Anxiety disorder, unspecified: Secondary | ICD-10-CM | POA: Diagnosis not present

## 2024-05-29 DIAGNOSIS — K219 Gastro-esophageal reflux disease without esophagitis: Secondary | ICD-10-CM | POA: Diagnosis not present

## 2024-05-29 DIAGNOSIS — Z9889 Other specified postprocedural states: Secondary | ICD-10-CM | POA: Diagnosis not present

## 2024-05-29 LAB — CBC
HCT: 30.1 % — ABNORMAL LOW (ref 36.0–46.0)
Hemoglobin: 9.6 g/dL — ABNORMAL LOW (ref 12.0–15.0)
MCH: 25.7 pg — ABNORMAL LOW (ref 26.0–34.0)
MCHC: 31.9 g/dL (ref 30.0–36.0)
MCV: 80.5 fL (ref 80.0–100.0)
Platelets: 132 K/uL — ABNORMAL LOW (ref 150–400)
RBC: 3.74 MIL/uL — ABNORMAL LOW (ref 3.87–5.11)
RDW: 16.2 % — ABNORMAL HIGH (ref 11.5–15.5)
WBC: 4.6 K/uL (ref 4.0–10.5)
nRBC: 0 % (ref 0.0–0.2)

## 2024-05-29 LAB — BASIC METABOLIC PANEL WITH GFR
Anion gap: 9 (ref 5–15)
BUN: 16 mg/dL (ref 8–23)
CO2: 24 mmol/L (ref 22–32)
Calcium: 9.1 mg/dL (ref 8.9–10.3)
Chloride: 104 mmol/L (ref 98–111)
Creatinine, Ser: 0.56 mg/dL (ref 0.44–1.00)
GFR, Estimated: >60 mL/min (ref >60)
Glucose, Bld: 104 mg/dL — ABNORMAL HIGH (ref 70–99)
Potassium: 3.9 mmol/L (ref 3.5–5.1)
Sodium: 137 mmol/L (ref 135–145)

## 2024-05-29 LAB — URINE CULTURE: Culture: >=100000 — AB

## 2024-05-29 LAB — GLUCOSE, CAPILLARY
Glucose-Capillary: 110 mg/dL — ABNORMAL HIGH (ref 70–99)
Glucose-Capillary: 114 mg/dL — ABNORMAL HIGH (ref 70–99)
Glucose-Capillary: 157 mg/dL — ABNORMAL HIGH (ref 70–99)
Glucose-Capillary: 142 mg/dL — ABNORMAL HIGH (ref 70–99)

## 2024-05-29 MED ORDER — SULFAMETHOXAZOLE-TRIMETHOPRIM 800-160 MG PO TABS
1.0000 | ORAL_TABLET | Freq: Two times a day (BID) | ORAL | Status: DC
Start: 2024-05-29 — End: 2024-05-30
  Administered 2024-05-29 – 2024-05-30 (×3): 1 via ORAL
  Filled 2024-05-29 (×4): qty 1

## 2024-05-29 MED ORDER — BUTALBITAL-APAP-CAFFEINE 50-325-40 MG PO TABS: 1.0000 | ORAL_TABLET | Freq: Four times a day (QID) | ORAL | Status: DC | PRN

## 2024-05-29 NOTE — Progress Notes (Signed)
 PROGRESS NOTE    Shirley Mcdonald  FMW:[id number] DOB: 1950/10/06 DOA: 05/25/2024 PCP: Sebastian Othel GAILS, FNP   Chief Complaint  Patient presents with   positive blood cultures    Brief Narrative:  Shirley Mcdonald is a 73 y.o. 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  Unclear which specific organism is noted on this culture however given recent ESBL infection patient reports never fully recovered from would presume this is the same organism.  Care hospitalist called for admission, ID sidelined for additional assistance in antibiotic regimen.    Assessment & Plan:   Principal Problem:   UTI (urinary tract infection) Active Problems:   Neurogenic bladder   History of suprapubic catheter   Type 2 diabetes mellitus without complication, without long-term current use of insulin  (HCC)   Essential hypertension   Anxiety and depression   Gastroesophageal reflux disease   Chronic gout without tophus   UTI due to extended-spectrum beta lactamase (ESBL) producing Escherichia coli  #1 acute recurrent cystitis/recent history of ESBL UTI -Patient noted to have presented with concerns for UTI. - Patient with recent history of ESBL infection. - Urine cultures preliminary results with > 100,000 colonies of Serratia marcescens (sensitive to cefepime, ertapenem, fluoroquinolones, gentamicin, Bactrim, meropenem), 60,000 colonies of Morganella morganii (sensitive to ampicillin, ertapenem, gentamicin, Bactrim, Unasyn, Zosyn, meropenem ). - Patient initially was on Maxipime however transitioned to cefepime. - Case discussed with ID pharmacist and patient will be transitioned from cefepime to oral Bactrim to complete a 7 to 10-day course of antibiotic treatment.  - ID was curb  sided by Dr. Lue early on in the hospitalization.  - Supportive care.   2.  History of urinary retention/neurogenic bladder -Status post suprapubic catheter. - It is noted the suprapubic catheter was changed on 05/20/2024. - Continue oxybutynin.  3.  Diabetes mellitus type 2, well-controlled on diet -Hemoglobin A1c 7.2. - CBG noted in 110 this morning.   - It is noted that patient taken off all her medications previously by PCP and diabetes being managed by diabetic diet. - SSI.   - Outpatient follow-up with PCP.    4.  Hypertension - Avapro  decreased to 75 mg daily.    5.  Hypothyroidism - Continue Synthroid .  6.  Depression/anxiety -Continue Lamictal , bupropion , duloxetine, amitriptyline.  7.  GERD - Continue Protonix , famotidine .   8.  Gout - Colchicine .   9.  Back pain - Continue scheduled Tylenol  1000 mg 3 times daily, scheduled ibuprofen .   - Heating pad.    DVT prophylaxis: Lovenox  Code Status: Full Family Communication: Updated patient.  No family at bedside. Disposition: Likely back to facility once urine cultures are finalized and patient able to be transition to oral antibiotics hopefully in the next 24.  Status is: Inpatient Remains inpatient appropriate because: Severity of illness   Consultants:  None  Procedures:  Chest x-ray 05/25/2024   Antimicrobials:  Anti-infectives (From admission, onward)    Start     Dose/Rate Route Frequency Ordered Stop   05/29/24 1400  sulfamethoxazole-trimethoprim (BACTRIM DS) 800-160 MG per tablet 1 tablet        1 tablet Oral Every 12 hours 05/29/24 1301 06/02/24 0959   05/26/24 1600  ceFEPIme (MAXIPIME) 2 g in sodium chloride  0.9 % 100 mL IVPB  Status:  Discontinued        2 g 200 mL/hr over 30 Minutes Intravenous Every 12 hours 05/26/24 0954 05/29/24 1301   05/26/24 0900  meropenem (MERREM) 1 g in sodium chloride  0.9 % 100 mL IVPB  Status:  Discontinued        1 g 200 mL/hr over 30 Minutes Intravenous  Every 8 hours 05/26/24 0159 05/26/24 0954   05/25/24 2230  meropenem (MERREM) 1 g in sodium chloride  0.9 % 100 mL IVPB        1 g 200 mL/hr over 30 Minutes Intravenous  Once 05/25/24 2225 05/26/24 0107         Subjective: Sitting up in chair.  Still with complaints of back pain however better controlled on current regimen of Tylenol  and ibuprofen .  Denies any chest pain or shortness of breath.  No abdominal pain.  Feeling better than she did on admission.    Objective: Vitals:   05/28/24 0627 05/28/24 1133 05/28/24 2041 05/29/24 0513  BP: 106/62 97/69 126/87 (!) 97/57  Pulse: 72 64 81 70  Resp: 16 16 20 18   Temp: 97.7 F (36.5 C) 98.2 F (36.8 C) 98.2 F (36.8 C) 97.8 F (36.6 C)  TempSrc: Oral Oral Oral Oral  SpO2: 95% 98% 99% 93%  Weight:      Height:        Intake/Output Summary (Last 24 hours) at 05/29/2024 1418 Last data filed at 05/29/2024 1000 Gross per 24 hour  Intake 600 ml  Output 2600 ml  Net -2000 ml   Filed Weights   05/25/24 2021  Weight: 78 kg    Examination:  General exam: NAD.  Respiratory system: Lungs clear to auscultation bilaterally.  No wheezes, no crackles, rhonchi.  Fair air movement.  Speaking in full sentences.    Cardiovascular system: RRR no murmurs rubs or gallops.  No JVD.  No lower extremity edema.  Gastrointestinal system: Abdomen is soft, nontender, nondistended, positive bowel sounds.  No rebound.  No guarding. Central nervous system: Alert and oriented. No focal neurological deficits. Extremities: Status post right AKA.  Skin: No rashes, lesions or ulcers Psychiatry: Judgement and insight appear normal. Mood & affect appropriate.     Data Reviewed: I have personally reviewed following labs and imaging studies  CBC: Recent Labs  Lab 05/25/24 2241 05/26/24 0506 05/27/24 0507 05/29/24 0447  WBC 6.3 6.4 5.2 4.6  NEUTROABS 4.1  --   --   --   HGB 10.1* 10.0* 11.4* 9.6*  HCT 30.2* 30.8* 34.8* 30.1*  MCV 77.6* 79.8* 79.1*  80.5  PLT 126* 109* 137* 132*    Basic Metabolic Panel: Recent Labs  Lab 05/25/24 2241 05/26/24 0506 05/27/24 0507 05/29/24 0447  NA 131* 133* 139 137  K 4.1 4.0 3.7 3.9  CL 97* 98 104 104  CO2 23 23 22 24   GLUCOSE 149* 142* 158* 104*  BUN 18 16 13 16   CREATININE 0.54 0.61 0.59 0.56  CALCIUM  9.3 9.0 9.7 9.1    GFR: Estimated Creatinine Clearance: 61.5 mL/min (by C-G formula based on SCr of 0.56 mg/dL).  Liver Function Tests: Recent Labs  Lab 05/25/24 2241  AST 27  ALT 21  ALKPHOS 57  BILITOT 0.7  PROT 6.4*  ALBUMIN 4.1    CBG: Recent Labs  Lab 05/28/24 1127 05/28/24 1619 05/28/24 2037 05/29/24 0719 05/29/24 1145  GLUCAP 197* 176* 135* 110* 114*     Recent Results (from the past 240 hours)  Resp panel by  RT-PCR (RSV, Flu A&B, Covid) Anterior Nasal Swab     Status: None   Collection Time: 05/25/24  8:58 PM   Specimen: Anterior Nasal Swab  Result Value Ref Range Status   SARS Coronavirus 2 by RT PCR NEGATIVE NEGATIVE Final    Comment: (NOTE) SARS-CoV-2 target nucleic acids are NOT DETECTED.  The SARS-CoV-2 RNA is generally detectable in upper respiratory specimens during the acute phase of infection. The lowest concentration of SARS-CoV-2 viral copies this assay can detect is 138 copies/mL. A negative result does not preclude SARS-Cov-2 infection and should not be used as the sole basis for treatment or other patient management decisions. A negative result may occur with  improper specimen collection/handling, submission of specimen other than nasopharyngeal swab, presence of viral mutation(s) within the areas targeted by this assay, and inadequate number of viral copies(<138 copies/mL). A negative result must be combined with clinical observations, patient history, and epidemiological information. The expected result is Negative.  Fact Sheet for Patients:  BloggerCourse.com  Fact Sheet for Healthcare Providers:   SeriousBroker.it  This test is no t yet approved or cleared by the [place]  FDA and  has been authorized for detection and/or diagnosis of SARS-CoV-2 by FDA under an Emergency Use Authorization (EUA). This EUA will remain  in effect (meaning this test can be used) for the duration of the COVID-19 declaration under Section 564(b)(1) of the Act, 21 U.S.C.section 360bbb-3(b)(1), unless the authorization is terminated  or revoked sooner.       Influenza A by PCR NEGATIVE NEGATIVE Final   Influenza B by PCR NEGATIVE NEGATIVE Final    Comment: (NOTE) The Xpert Xpress SARS-CoV-2/FLU/RSV plus assay is intended as an aid in the diagnosis of influenza from Nasopharyngeal swab specimens and should not be used as a sole basis for treatment. Nasal washings and aspirates are unacceptable for Xpert Xpress SARS-CoV-2/FLU/RSV testing.  Fact Sheet for Patients: BloggerCourse.com  Fact Sheet for Healthcare Providers: SeriousBroker.it  This test is not yet approved or cleared by the [place]  FDA and has been authorized for detection and/or diagnosis of SARS-CoV-2 by FDA under an Emergency Use Authorization (EUA). This EUA will remain in effect (meaning this test can be used) for the duration of the COVID-19 declaration under Section 564(b)(1) of the Act, 21 U.S.C. section 360bbb-3(b)(1), unless the authorization is terminated or revoked.     Resp Syncytial Virus by PCR NEGATIVE NEGATIVE Final    Comment: (NOTE) Fact Sheet for Patients: BloggerCourse.com  Fact Sheet for Healthcare Providers: SeriousBroker.it  This test is not yet approved or cleared by the [place]  FDA and has been authorized for detection and/or diagnosis of SARS-CoV-2 by FDA under an Emergency Use Authorization (EUA). This EUA will remain in effect (meaning this test can be used) for  the duration of the COVID-19 declaration under Section 564(b)(1) of the Act, 21 U.S.C. section 360bbb-3(b)(1), unless the authorization is terminated or revoked.  Performed at [facility], [address], [place], [place] [address]   Blood culture (routine x 2)     Status: None (Preliminary result)   Collection Time: 05/25/24 10:41 PM   Specimen: BLOOD LEFT FOREARM  Result Value Ref Range Status   Specimen Description   Final    BLOOD LEFT FOREARM Performed at [facility], [address], [place], [place] [address]    Special Requests   Final    Blood Culture adequate volume BOTTLES DRAWN AEROBIC AND ANAEROBIC Performed at [facility], [address], [place], [place] [address]    Culture   Final    NO GROWTH 3 DAYS Performed at [facility], [address], [place], [place] [address]    Report Status PENDING  Incomplete  Urine Culture (for pregnant, neutropenic or urologic patients or patients with an indwelling urinary catheter)     Status: Abnormal   Collection Time: 05/25/24 11:24 PM   Specimen: Urine, Catheterized  Result Value Ref Range Status   Specimen Description   Final    URINE, CATHETERIZED Performed at [facility], [address] [place], [place] [address]    Special Requests   Final    NONE Performed at [facility], [address], [place], [place] [address]    Culture (A)  Final    >=100,000 COLONIES/mL SERRATIA MARCESCENS 60,000 COLONIES/mL MORGANELLA MORGANII    Report Status 05/29/2024 FINAL  Final   Organism ID, Bacteria SERRATIA MARCESCENS (A)  Final   Organism ID, Bacteria MORGANELLA MORGANII (A)  Final      Susceptibility   Morganella morganii - MIC*    AMPICILLIN <=2 SENSITIVE Sensitive     ERTAPENEM <=0.12 SENSITIVE Sensitive     CIPROFLOXACIN 0.5 INTERMEDIATE Intermediate     GENTAMICIN <=1 SENSITIVE Sensitive     NITROFURANTOIN 64 INTERMEDIATE  Intermediate     TRIMETH/SULFA <=20 SENSITIVE Sensitive     AMPICILLIN/SULBACTAM <=2 SENSITIVE Sensitive     PIP/TAZO Value in next row Sensitive      <=4 SENSITIVEThis is a modified FDA-approved test that has been validated and its performance characteristics determined by the reporting laboratory.  This laboratory is certified under the Clinical Laboratory Improvement Amendments CLIA as qualified to perform high complexity clinical laboratory testing.    MEROPENEM Value in next row Sensitive      <=4 SENSITIVEThis is a modified FDA-approved test that has been validated and its performance characteristics determined by the reporting laboratory.  This laboratory is certified under the Clinical Laboratory Improvement Amendments CLIA as qualified to perform high complexity clinical laboratory testing.    * 60,000 COLONIES/mL MORGANELLA MORGANII   Serratia marcescens - MIC*    CEFEPIME Value in next row Sensitive      <=4 SENSITIVEThis is a modified FDA-approved test that has been validated and its performance characteristics determined by the reporting laboratory.  This laboratory is certified under the Clinical Laboratory Improvement Amendments CLIA as qualified to perform high complexity clinical laboratory testing.    ERTAPENEM Value in next row Sensitive      <=4 SENSITIVEThis is a modified FDA-approved test that has been validated and its performance characteristics determined by the reporting laboratory.  This laboratory is certified under the Clinical Laboratory Improvement Amendments CLIA as qualified to perform high complexity clinical laboratory testing.    CEFTRIAXONE Value in next row Resistant      <=4 SENSITIVEThis is a modified FDA-approved test that has been validated and its performance characteristics determined by the reporting laboratory.  This laboratory is certified under the Clinical Laboratory Improvement Amendments CLIA as qualified to perform high complexity clinical laboratory  testing.    CIPROFLOXACIN Value in next row Sensitive      <=4 SENSITIVEThis is a modified FDA-approved test that has been validated and its performance characteristics determined by the reporting laboratory.  This laboratory is certified under the Clinical Laboratory Improvement Amendments CLIA as qualified to perform high complexity clinical laboratory testing.    GENTAMICIN Value  in next row Sensitive      <=4 SENSITIVEThis is a modified FDA-approved test that has been validated and its performance characteristics determined by the reporting laboratory.  This laboratory is certified under the Clinical Laboratory Improvement Amendments CLIA as qualified to perform high complexity clinical laboratory testing.    NITROFURANTOIN Value in next row Resistant      <=4 SENSITIVEThis is a modified FDA-approved test that has been validated and its performance characteristics determined by the reporting laboratory.  This laboratory is certified under the Clinical Laboratory Improvement Amendments CLIA as qualified to perform high complexity clinical laboratory testing.    TRIMETH/SULFA Value in next row Sensitive      <=4 SENSITIVEThis is a modified FDA-approved test that has been validated and its performance characteristics determined by the reporting laboratory.  This laboratory is certified under the Clinical Laboratory Improvement Amendments CLIA as qualified to perform high complexity clinical laboratory testing.    MEROPENEM Value in next row Sensitive      <=4 SENSITIVEThis is a modified FDA-approved test that has been validated and its performance characteristics determined by the reporting laboratory.  This laboratory is certified under the Clinical Laboratory Improvement Amendments CLIA as qualified to perform high complexity clinical laboratory testing.    * >=100,000 COLONIES/mL SERRATIA MARCESCENS  Blood culture (routine x 2)     Status: None (Preliminary result)   Collection Time: 05/26/24  5:06  AM   Specimen: BLOOD RIGHT ARM  Result Value Ref Range Status   Specimen Description   Final    BLOOD RIGHT ARM Performed at [facility], [address], [place], [place] [address]    Special Requests   Final    BOTTLES DRAWN AEROBIC ONLY Blood Culture results may not be optimal due to an inadequate volume of blood received in culture bottles Performed at [facility], [address], [place], [place] [address]    Culture   Final    NO GROWTH 3 DAYS Performed at [facility], [address] [place], [place] [address]    Report Status PENDING  Incomplete  MRSA Next Gen by PCR, Nasal     Status: Abnormal   Collection Time: 05/26/24  1:14 PM   Specimen: Nasal Mucosa; Nasal Swab  Result Value Ref Range Status   MRSA by PCR Next Gen DETECTED (A) NOT DETECTED Final    Comment: (NOTE) The GeneXpert MRSA Assay (FDA approved for NASAL specimens only), is one component of a comprehensive MRSA colonization surveillance program. It is not intended to diagnose MRSA infection nor to guide or monitor treatment for MRSA infections. Test performance is not FDA approved in patients less than 78 years old. Performed at [facility], [address], [place], [place] [address]          Radiology Studies: No results found.       Scheduled Meds:  acetaminophen   1,000 mg Oral TID   acidophilus  1 capsule Oral Daily   amitriptyline  25 mg Oral QHS   ascorbic acid  1,000 mg Oral Daily   benzonatate   200 mg Oral TID   buPROPion   150 mg Oral BID   calcium  carbonate  500 mg of elemental calcium  Oral BID WC   cholecalciferol   5,000 Units Oral Daily   colchicine   0.6 mg Oral BID   docusate sodium   250 mg Oral QHS   DULoxetine  60 mg Oral Daily   enoxaparin  (LOVENOX ) injection  40 mg Subcutaneous Q24H   famotidine   40 mg Oral Daily   ibuprofen   400 mg Oral BID   insulin  aspart  0-9 Units Subcutaneous TID WC   irbesartan   75 mg Oral Daily    lamoTRIgine   100 mg Oral QPM   levothyroxine   50 mcg Oral Daily   loratadine   10 mg Oral QHS   oxybutynin  5 mg Oral BID   pantoprazole   40 mg Oral QHS   sulfamethoxazole-trimethoprim  1 tablet Oral Q12H   traZODone   100 mg Oral QHS   Continuous Infusions:     LOS: 4 days    Time spent: 35 minutes    Toribio Hummer, MD [facility]   To contact the attending provider between 7A-7P or the covering provider during after hours 7P-7A, please log into the web site [email or web address] and access using universal [place] password for that web site. If you do not have the password, please call the hospital operator.  05/29/2024, 2:18 PM

## 2024-05-29 NOTE — Plan of Care (Signed)
   Problem: Coping: Goal: Level of anxiety will decrease Outcome: Progressing   Problem: Pain Managment: Goal: General experience of comfort will improve and/or be controlled Outcome: Progressing   Problem: Safety: Goal: Ability to remain free from injury will improve Outcome: Progressing

## 2024-05-29 NOTE — Evaluation (Signed)
 Occupational Therapy Evaluation Patient Details Name: Shirley Mcdonald MRN: [id number] DOB: 10/22/1950 Today's Date: 05/29/2024   History of Present Illness   Shirley Mcdonald is a 73 y.o.was sent by PCP to the ED for IV antibiotics due to positive urine culture -prior history of ESBL per outpatient report. 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  Currently, patient presents with deficits outlined below (see OT Problem List for details) most significantly decreased activity tolerance, balance and UE ROM limiting BADL's (CGA) and functional mobility (Supervision SPT). Patient requires continued Acute care hospital level OT services to progress safety and functional performance and allow for discharge. Recommending HHOT services upon discharge from Acute medical setting.       If plan is discharge home, recommend the following:   A little help with bathing/dressing/bathroom;Assistance with cooking/housework;Assist for transportation;Help with stairs or ramp for entrance (use of power w/c)     Functional Status Assessment   Patient has had a recent decline in their functional status and demonstrates the ability to make significant improvements in function in a reasonable and predictable amount of time.     Equipment Recommendations   None recommended by OT      Precautions/Restrictions   Precautions Precautions: Fall Restrictions Weight Bearing Restrictions Per Provider Order: No     Mobility Bed Mobility Overal bed mobility:  (was in recliner and remained post session)                   Transfers Overall transfer level: Modified independent                 General transfer comment: places chair perpendicular to bed and able to self direct      Balance Overall balance assessment: Mild deficits observed, not formally tested                                         ADL either performed or assessed with clinical judgement   ADL Overall ADL's : Needs assistance/impaired Eating/Feeding: Set up;With adaptive utensils Eating/Feeding Details (indicate cue type and reason): issued additional red foam for utensil grips as patient uses adapted utensils at home with success Grooming: Wash/dry hands;Wash/dry face;Oral care;Sitting;Modified independent   Upper Body Bathing: Set up;Sitting   Lower Body Bathing: Contact guard assist;Sitting/lateral leans   Upper Body Dressing : Modified independent;Sitting   Lower Body Dressing: Contact guard assist;Sitting/lateral leans   Toilet Transfer: BSC/3in1;Supervision/safety;Stand-pivot Toilet Transfer Details (indicate cue type and reason): places all transfer surfaces perpendiclular to self Toileting- Clothing Manipulation and Hygiene: Sitting/lateral lean;Modified independent       Functional mobility during ADLs: Supervision/safety (able to self direct set up) General ADL Comments: 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 and R high AKA for years has had to compensate and uses power w/c in ILF     Vision Baseline Vision/History: 1 Wears glasses;0 No visual deficits              Pertinent Vitals/Pain Pain Assessment Pain Assessment: Faces Faces Pain Scale: Hurts little more  Pain Location: shoulders pulling self up in bed Pain Descriptors / Indicators: Discomfort     Extremity/Trunk Assessment Upper Extremity Assessment Upper Extremity Assessment: Left hand dominant;Generalized weakness   Lower Extremity Assessment Lower Extremity Assessment: Defer to PT evaluation   Cervical / Trunk  Assessment Cervical / Trunk Assessment: Normal   Communication Communication Communication: No apparent difficulties   Cognition Arousal: Alert Behavior During Therapy: WFL for tasks assessed/performed Cognition: No apparent impairments                               Following commands: Intact       Cueing  General Comments   Cueing Techniques: Verbal cues  on RA, no SOB           Home Living Family/patient expects to be discharged to:: Private residence Living Arrangements: Alone   Type of Home: Independent living facility Home Access: Level entry;Elevator (2nd floor)     Home Layout: One level     Bathroom Shower/Tub: Walk-in Pensions consultant: Handicapped height Bathroom Accessibility: Yes How Accessible: Accessible via wheelchair Home Equipment: Wheelchair - power;Tub bench;Grab bars - toilet;Grab bars - tub/shower;Hand held shower head;Adaptive equipment Adaptive Equipment: Reacher        Prior Functioning/Environment Prior Level of Function : Independent/Modified Independent             Mobility Comments: performs stand pivot transfer to  WC, toilet and Shower ADLs Comments: mod I for ADL's, self tasks, takes 3 meals in Dining rm, [place] via facility bus or car transfers w/ family    OT Problem List: Decreased range of motion;Decreased strength;Decreased activity tolerance;Impaired balance (sitting and/or standing);Pain;Impaired UE functional use   OT Treatment/Interventions: Self-care/ADL training;Therapeutic exercise;Energy conservation;DME and/or AE instruction;Therapeutic activities;Patient/family education;Balance training      OT Goals(Current goals can be found in the care plan section)   Acute Rehab OT Goals Patient Stated Goal: to get home soon with my services OT Goal Formulation: With patient Time For Goal Achievement: 06/12/24 Potential to Achieve Goals: Good ADL Goals Pt Will Perform Lower Body  Dressing: with modified independence;with adaptive equipment;sitting/lateral leans Pt Will Transfer to Toilet: stand pivot transfer;bedside commode Pt Will Perform Tub/Shower Transfer: with contact guard assist;Stand pivot transfer;tub bench;grab bars Pt/caregiver will Perform Home Exercise Program: Increased ROM;Independently;With written HEP provided   OT Frequency:  Min 1X/week       AM-PAC OT 6 Clicks Daily Activity     Outcome Measure Help from another person eating meals?: A Little Help from another person taking care of personal grooming?: A Little Help from another person toileting, which includes using toliet, bedpan, or urinal?: A Little Help from another person bathing (including washing, rinsing, drying)?: A Little Help from another person to put on and taking off regular upper body clothing?: A Little Help from another person to put on and taking off regular lower body clothing?: A Little 6 Click Score: 18   End of Session Equipment Utilized During Treatment: Gait belt Nurse Communication: Mobility status  Activity Tolerance: Patient tolerated treatment well Patient left: in chair;with call bell/phone within reach;with chair alarm set  OT Visit Diagnosis: Unsteadiness on feet (R26.81);Muscle weakness (generalized) (M62.81)                Time: 1241-1300 OT Time Calculation (min): 19 min Charges:  OT General Charges $OT Visit: 1 Visit OT Evaluation $OT Eval Low Complexity: 1 Low  Johanna Stafford  OT/L Acute Rehabilitation Department  [phone]  05/29/2024, 2:01 PM

## 2024-05-30 ENCOUNTER — Other Ambulatory Visit (HOSPITAL_COMMUNITY): Payer: Self-pay

## 2024-05-30 DIAGNOSIS — F419 Anxiety disorder, unspecified: Secondary | ICD-10-CM | POA: Diagnosis not present

## 2024-05-30 DIAGNOSIS — K219 Gastro-esophageal reflux disease without esophagitis: Secondary | ICD-10-CM | POA: Diagnosis not present

## 2024-05-30 DIAGNOSIS — N3000 Acute cystitis without hematuria: Secondary | ICD-10-CM | POA: Diagnosis not present

## 2024-05-30 DIAGNOSIS — Z9889 Other specified postprocedural states: Secondary | ICD-10-CM | POA: Diagnosis not present

## 2024-05-30 LAB — BASIC METABOLIC PANEL WITH GFR
Anion gap: 9 (ref 5–15)
BUN: 13 mg/dL (ref 8–23)
CO2: 25 mmol/L (ref 22–32)
Calcium: 8.9 mg/dL (ref 8.9–10.3)
Chloride: 103 mmol/L (ref 98–111)
Creatinine, Ser: 0.6 mg/dL (ref 0.44–1.00)
GFR, Estimated: >60 mL/min (ref >60)
Glucose, Bld: 94 mg/dL (ref 70–99)
Potassium: 3.8 mmol/L (ref 3.5–5.1)
Sodium: 137 mmol/L (ref 135–145)

## 2024-05-30 LAB — CBC
HCT: 29.7 % — ABNORMAL LOW (ref 36.0–46.0)
Hemoglobin: 9.5 g/dL — ABNORMAL LOW (ref 12.0–15.0)
MCH: 25.7 pg — ABNORMAL LOW (ref 26.0–34.0)
MCHC: 32 g/dL (ref 30.0–36.0)
MCV: 80.5 fL (ref 80.0–100.0)
Platelets: 119 K/uL — ABNORMAL LOW (ref 150–400)
RBC: 3.69 MIL/uL — ABNORMAL LOW (ref 3.87–5.11)
RDW: 16.1 % — ABNORMAL HIGH (ref 11.5–15.5)
WBC: 4.4 K/uL (ref 4.0–10.5)
nRBC: 0 % (ref 0.0–0.2)

## 2024-05-30 LAB — GLUCOSE, CAPILLARY
Glucose-Capillary: 82 mg/dL (ref 70–99)
Glucose-Capillary: 110 mg/dL — ABNORMAL HIGH (ref 70–99)

## 2024-05-30 MED ORDER — SULFAMETHOXAZOLE-TRIMETHOPRIM 800-160 MG PO TABS
1.0000 | ORAL_TABLET | Freq: Two times a day (BID) | ORAL | 0 refills | Status: AC
  Filled 2024-05-30: qty 6, 3d supply, fill #0

## 2024-05-30 MED ORDER — IBUPROFEN 400 MG PO TABS: ORAL_TABLET | ORAL | Status: AC

## 2024-05-30 MED ORDER — HYDROXYZINE HCL 25 MG PO TABS
25.0000 mg | ORAL_TABLET | Freq: Three times a day (TID) | ORAL | 0 refills | Status: AC | PRN
  Filled 2024-05-30: qty 20, 7d supply, fill #0

## 2024-05-30 MED ORDER — CHLORHEXIDINE GLUCONATE CLOTH 2 % EX PADS
6.0000 | MEDICATED_PAD | Freq: Every day | CUTANEOUS | Status: DC
  Administered 2024-05-30: 6 via TOPICAL

## 2024-05-30 MED ORDER — ACETAMINOPHEN 500 MG PO TABS: ORAL_TABLET | ORAL | Status: AC

## 2024-05-30 MED ORDER — TRAMADOL HCL 50 MG PO TABS
50.0000 mg | ORAL_TABLET | Freq: Four times a day (QID) | ORAL | 0 refills | Status: AC | PRN
  Filled 2024-05-30: qty 20, 5d supply, fill #0

## 2024-05-30 MED ORDER — OLMESARTAN MEDOXOMIL 20 MG PO TABS: 10.0000 mg | ORAL_TABLET | Freq: Every day | ORAL | Status: AC

## 2024-05-30 MED ORDER — BENZONATATE 200 MG PO CAPS
200.0000 mg | ORAL_CAPSULE | Freq: Three times a day (TID) | ORAL | 0 refills | Status: AC | PRN
  Filled 2024-05-30: qty 20, 7d supply, fill #0

## 2024-05-30 NOTE — Discharge Summary (Signed)
 Physician Discharge Summary  Shirley Mcdonald FMW:[id number] DOB: 08-Jul-1951 DOA: 05/25/2024  PCP: Sebastian Othel GAILS, FNP  Admit date: 05/25/2024 Discharge date: 05/30/2024  Time spent: 60 minutes  Recommendations for Outpatient Follow-up:  Follow-up with Sebastian Othel GAILS, FNP in 2 weeks.  On follow-up patient's UTI will need to be reassessed.  Patient's diabetes will need to be followed up upon.  Patient will need a basic metabolic profile done to follow-up on electrolytes and renal function.   Discharge Diagnoses:  Principal Problem:   UTI (urinary tract infection) Active Problems:   Neurogenic bladder   History of suprapubic catheter   Type 2 diabetes mellitus without complication, without long-term current use of insulin  (HCC)   Essential hypertension   Anxiety and depression   Gastroesophageal reflux disease   Chronic gout without tophus   UTI due to extended-spectrum beta lactamase (ESBL) producing Escherichia coli   Discharge Condition: Stable and improved.  Diet recommendation: Regular  Filed Weights   05/25/24 2021  Weight: 78 kg    History of present illness:  HPI per Dr. Lou Rojelio Oberry is a 73 y.o. 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  Patient reports she is currently at a independent living facility and has a chronic suprapubic catheter that is changed monthly.  Urine became cloudy and her catheter was exchanged last week, urine culture sent for evaluation.  She does endorse some hot and cold symptoms since last week and a mild cough since last Wednesday but denies any fevers, nausea, vomiting, abdominal pain, headache, hematuria.  She received a call from the PCP advising her to present to the ED for IV antibiotics due to findings from the urine culture.  Patient does  not know which bacteria was growing on the culture.   ED Course: Initial vitals show patient afebrile and normotensive. Initial labs significant for sodium 131, glucose 149, normal renal function, Hgb 10.1, WBC 6.3, platelet 126, negative flu, RSV and COVID test. CXR shows borderline cardiomegaly but no active disease. Pt received IV meropenem. TRH was consulted for admission.   Hospital Course:  #1 acute recurrent cystitis/recent history of ESBL UTI -Patient noted to have presented with concerns for UTI. - Patient with recent history of ESBL infection. - Urine cultures with > 100,000 colonies of Serratia marcescens (sensitive to cefepime, ertapenem, fluoroquinolones, gentamicin, Bactrim, meropenem), 60,000 colonies of Morganella morganii (sensitive to ampicillin, ertapenem, gentamicin, Bactrim, Unasyn, Zosyn, meropenem ). - Patient initially was on Maxipime however transitioned to cefepime. - Case discussed with ID pharmacist and patient subsequently transition from cefepime to oral Bactrim to complete a 7-day course of antibiotic treatment.   - Patient was discharged on 3 more days of Bactrim.   - ID was curb sided by Dr. Lue early on in the hospitalization.  -Patient was discharged in stable and improved condition.   2.  History of urinary retention/neurogenic bladder -Status post suprapubic catheter. - It is noted the suprapubic catheter was changed on 05/20/2024 prior to admission. - Patient maintained on home regimen oxybutynin.    3.  Diabetes mellitus type 2, well-controlled on diet -Hemoglobin A1c 7.2. - It is noted that patient taken off all her medications previously by PCP and diabetes being managed by diabetic diet. - Patient managed on SSI during the hospitalization.   - Outpatient follow-up with PCP.  4.  Hypertension - Avapro  decreased to 75 mg daily.   -Patient's home regimen Benicar will be decreased to 10 mg on discharge. -Outpatient follow-up with PCP.   5.   Hypothyroidism - Patient maintained on home regimen Synthroid .   6.  Depression/anxiety - Patient maintained on home regimen Lamictal , bupropion , duloxetine, amitriptyline.   7.  GERD - Patient maintained on home regimen Protonix , famotidine .    8.  Gout - Patient maintained on home regimen colchicine .    9.  Back pain - Patient was placed on scheduled Tylenol  1000 mg 3 times daily, scheduled ibuprofen  and also heating pad placed. - Improvement with back pain. - Outpatient follow-up with PCP.    Procedures: Chest x-ray 05/25/2024  Consultations: None  Discharge Exam: Vitals:   05/30/24 0454 05/30/24 1220  BP: 111/66 105/65  Pulse: 75 81  Resp: 15 14  Temp: 98 F (36.7 C) 98.1 F (36.7 C)  SpO2: 96% 95%    General: NAD Cardiovascular: RRR no murmurs rubs or gallops.  No JVD.  No lower extremity edema. Respiratory: Clear to auscultation bilaterally.  No wheezes, no crackles, rhonchi.  Fair air movement.  Speaking in full sentences.  Discharge Instructions   Discharge Instructions     Diet general   Complete by: As directed    Increase activity slowly   Complete by: As directed       Allergies as of 05/30/2024       Reactions   Hydromorphone  Hcl Rash   Pt states she did not have a rash on this medication   Hydromorphone     Sleepy   Lotrel [amlodipine  Besy-benazepril Hcl] Cough   Oxycontin  [oxycodone  Hcl] Itching   Saccharin    Causes stomach pain    Tape    Blister   Adhesive [tape] Rash   Causes blistering   Amlodipine  Besy-benazepril Hcl Cough   Saccharin Other (See Comments)        Medication List     PAUSE taking these medications    Lantus  SoloStar 100 UNIT/ML Solostar Pen Wait to take this until your doctor or other care provider tells you to start again. Generic drug: insulin  glargine Inject 17 Units into the skin at bedtime.       STOP taking these medications    HYDROcodone -acetaminophen  10-325 MG tablet Commonly known as:  NORCO   insulin  lispro 100 UNIT/ML KwikPen Commonly known as: HUMALOG    polyethylene glycol 17 g packet Commonly known as: MiraLax        TAKE these medications    acetaminophen  500 MG tablet Commonly known as: TYLENOL  Take 2 tablets (1,000 mg total) by mouth 3 (three) times daily for 5 days, THEN 2 tablets (1,000 mg total) every 8 (eight) hours as needed. Start taking on: May 30, 2024   ALKA-SELTZER ANTACID PO Take 1 tablet by mouth daily as needed (gummie.  chew one tablet if needed for GERD).   amitriptyline 25 MG tablet Commonly known as: ELAVIL Take 25 mg by mouth at bedtime.   ascorbic acid 500 MG tablet Commonly known as: VITAMIN C Take 1,000 mg by mouth daily.   benzonatate  200 MG capsule Commonly known as: TESSALON  Take 1 capsule (200 mg total) by mouth 3 (three) times daily as needed for cough.   buPROPion  150 MG 12 hr tablet Commonly known as: ZYBAN  Take 150 mg by mouth 2 (two) times daily.   butalbital-acetaminophen -caffeine 50-325-40 MG tablet Commonly known as: FIORICET Take 1 tablet by mouth every 6 (  six) hours as needed for migraine.   cetirizine 10 MG chewable tablet Commonly known as: ZYRTEC Chew 10 mg by mouth daily.   colchicine  0.6 MG tablet Take 0.6 mg by mouth 2 (two) times daily.   Culturelle Digestive Health Caps Take 1 capsule by mouth daily.   D 1000 25 MCG (1000 UT) capsule Generic drug: Cholecalciferol  Take 5,000 Units by mouth daily.   docusate sodium  250 MG capsule Commonly known as: COLACE Take 250 mg by mouth at bedtime.   DULoxetine 60 MG capsule Commonly known as: CYMBALTA Take 60 mg by mouth daily.   famotidine  40 MG tablet Commonly known as: PEPCID  Take 40 mg by mouth daily.   fluticasone  50 MCG/ACT nasal spray Commonly known as: FLONASE  Place 2 sprays into both nostrils daily as needed for allergies.   hydrOXYzine 25 MG tablet Commonly known as: ATARAX Take 1 tablet (25 mg total) by mouth 3 (three) times  daily as needed for itching or anxiety.   ibuprofen  400 MG tablet Commonly known as: ADVIL  Take 1 tablet (400 mg total) by mouth 2 (two) times daily for 7 days, THEN 1 tablet (400 mg total) every 6 (six) hours as needed. Start taking on: May 30, 2024   lamoTRIgine  100 MG tablet Commonly known as: LAMICTAL  Take 100 mg by mouth every evening.   levothyroxine  50 MCG tablet Commonly known as: SYNTHROID  Take 50 mcg by mouth daily.   olmesartan 20 MG tablet Commonly known as: BENICAR Take 0.5 tablets (10 mg total) by mouth daily. What changed: how much to take   oxybutynin 5 MG tablet Commonly known as: DITROPAN Take 5 mg by mouth 2 (two) times daily.   pantoprazole  40 MG tablet Commonly known as: PROTONIX  Take 40 mg by mouth daily.   sulfamethoxazole-trimethoprim 800-160 MG tablet Commonly known as: BACTRIM DS Take 1 tablet by mouth every 12 (twelve) hours for 3 days.   SUPER B COMPLEX PO Take 1 tablet by mouth every evening.   Super Calcium  1500 (600 Ca) MG Tabs tablet Generic drug: calcium  carbonate Take 600 mg of elemental calcium  by mouth 2 (two) times daily with a meal.   traMADol  50 MG tablet Commonly known as: ULTRAM  Take 1 tablet (50 mg total) by mouth every 6 (six) hours as needed for moderate pain (pain score 4-6).   traZODone  50 MG tablet Commonly known as: DESYREL  Take 100 mg by mouth at bedtime.       Allergies  Allergen Reactions   Hydromorphone  Hcl Rash    Pt states she did not have a rash on this medication   Hydromorphone      Sleepy   Lotrel [Amlodipine  Besy-Benazepril Hcl] Cough   Oxycontin  [Oxycodone  Hcl] Itching   Saccharin     Causes stomach pain    Tape     Blister    Adhesive [Tape] Rash    Causes blistering   Amlodipine  Besy-Benazepril Hcl Cough   Saccharin Other (See Comments)    Follow-up Information     Sebastian Othel GAILS, FNP. Schedule an appointment as soon as possible for a visit in 2 week(s).   Specialty: Nurse  Practitioner Contact information: [address] [place] [address] [phone]                  The results of significant diagnostics from this hospitalization (including imaging, microbiology, ancillary and laboratory) are listed below for reference.    Significant Diagnostic Studies: DG Chest Portable 1 View  Result Date: 05/25/2024 CLINICAL DATA:  Shortness of breath. EXAM: PORTABLE CHEST 1 VIEW COMPARISON:  01/01/2023 FINDINGS: The heart is borderline enlarged. The cardiomediastinal contours are normal. The lungs are clear. Pulmonary vasculature is normal. No consolidation, pleural effusion, or pneumothorax. Bilateral shoulder arthroplasties. No acute osseous abnormalities are seen. IMPRESSION: Borderline cardiomegaly. Electronically Signed   By: Andrea Gasman M.D.   On: 05/25/2024 20:40    Microbiology: Recent Results (from the past 240 hours)  Resp panel by RT-PCR (RSV, Flu A&B, Covid) Anterior Nasal Swab     Status: None   Collection Time: 05/25/24  8:58 PM   Specimen: Anterior Nasal Swab  Result Value Ref Range Status   SARS Coronavirus 2 by RT PCR NEGATIVE NEGATIVE Final    Comment: (NOTE) SARS-CoV-2 target nucleic acids are NOT DETECTED.  The SARS-CoV-2 RNA is generally detectable in upper respiratory specimens during the acute phase of infection. The lowest concentration of SARS-CoV-2 viral copies this assay can detect is 138 copies/mL. A negative result does not preclude SARS-Cov-2 infection and should not be used as the sole basis for treatment or other patient management decisions. A negative result may occur with  improper specimen collection/handling, submission of specimen other than nasopharyngeal swab, presence of viral mutation(s) within the areas targeted by this assay, and inadequate number of viral copies(<138 copies/mL). A negative result must be combined with clinical observations, patient history, and  epidemiological information. The expected result is Negative.  Fact Sheet for Patients:  BloggerCourse.com  Fact Sheet for Healthcare Providers:  SeriousBroker.it  This test is no t yet approved or cleared by the [place]  FDA and  has been authorized for detection and/or diagnosis of SARS-CoV-2 by FDA under an Emergency Use Authorization (EUA). This EUA will remain  in effect (meaning this test can be used) for the duration of the COVID-19 declaration under Section 564(b)(1) of the Act, 21 U.S.C.section 360bbb-3(b)(1), unless the authorization is terminated  or revoked sooner.       Influenza A by PCR NEGATIVE NEGATIVE Final   Influenza B by PCR NEGATIVE NEGATIVE Final    Comment: (NOTE) The Xpert Xpress SARS-CoV-2/FLU/RSV plus assay is intended as an aid in the diagnosis of influenza from Nasopharyngeal swab specimens and should not be used as a sole basis for treatment. Nasal washings and aspirates are unacceptable for Xpert Xpress SARS-CoV-2/FLU/RSV testing.  Fact Sheet for Patients: BloggerCourse.com  Fact Sheet for Healthcare Providers: SeriousBroker.it  This test is not yet approved or cleared by the [place]  FDA and has been authorized for detection and/or diagnosis of SARS-CoV-2 by FDA under an Emergency Use Authorization (EUA). This EUA will remain in effect (meaning this test can be used) for the duration of the COVID-19 declaration under Section 564(b)(1) of the Act, 21 U.S.C. section 360bbb-3(b)(1), unless the authorization is terminated or revoked.     Resp Syncytial Virus by PCR NEGATIVE NEGATIVE Final    Comment: (NOTE) Fact Sheet for Patients: BloggerCourse.com  Fact Sheet for Healthcare Providers: SeriousBroker.it  This test is not yet approved or cleared by the [place]  FDA and has been  authorized for detection and/or diagnosis of SARS-CoV-2 by FDA under an Emergency Use Authorization (EUA). This EUA will remain in effect (meaning this test can be used) for the duration of the COVID-19 declaration under Section 564(b)(1) of the Act, 21 U.S.C. section 360bbb-3(b)(1), unless the authorization is terminated or revoked.  Performed at [facility], [address], [place], [place] [address]  Blood culture (routine x 2)     Status: None (Preliminary result)   Collection Time: 05/25/24 10:41 PM   Specimen: BLOOD LEFT FOREARM  Result Value Ref Range Status   Specimen Description   Final    BLOOD LEFT FOREARM Performed at [facility], [address], [place], [place] [address]    Special Requests   Final    Blood Culture adequate volume BOTTLES DRAWN AEROBIC AND ANAEROBIC Performed at [facility], [address], [place], [place] [address]    Culture   Final    NO GROWTH 4 DAYS Performed at [facility], [address], [place], [place] [address]    Report Status PENDING  Incomplete  Urine Culture (for pregnant, neutropenic or urologic patients or patients with an indwelling urinary catheter)     Status: Abnormal   Collection Time: 05/25/24 11:24 PM   Specimen: Urine, Catheterized  Result Value Ref Range Status   Specimen Description   Final    URINE, CATHETERIZED Performed at [facility], [address], [place], [place] [address]    Special Requests   Final    NONE Performed at [facility], [address], [place], [place] [address]    Culture (A)  Final    >=100,000 COLONIES/mL SERRATIA MARCESCENS 60,000 COLONIES/mL MORGANELLA MORGANII    Report Status 05/29/2024 FINAL  Final   Organism ID, Bacteria SERRATIA MARCESCENS (A)  Final   Organism ID, Bacteria MORGANELLA MORGANII (A)  Final      Susceptibility   Morganella morganii - MIC*    AMPICILLIN <=2 SENSITIVE  Sensitive     ERTAPENEM <=0.12 SENSITIVE Sensitive     CIPROFLOXACIN 0.5 INTERMEDIATE Intermediate     GENTAMICIN <=1 SENSITIVE Sensitive     NITROFURANTOIN 64 INTERMEDIATE Intermediate     TRIMETH/SULFA <=20 SENSITIVE Sensitive     AMPICILLIN/SULBACTAM <=2 SENSITIVE Sensitive     PIP/TAZO Value in next row Sensitive      <=4 SENSITIVEThis is a modified FDA-approved test that has been validated and its performance characteristics determined by the reporting laboratory.  This laboratory is certified under the Clinical Laboratory Improvement Amendments CLIA as qualified to perform high complexity clinical laboratory testing.    MEROPENEM Value in next row Sensitive      <=4 SENSITIVEThis is a modified FDA-approved test that has been validated and its performance characteristics determined by the reporting laboratory.  This laboratory is certified under the Clinical Laboratory Improvement Amendments CLIA as qualified to perform high complexity clinical laboratory testing.    * 60,000 COLONIES/mL MORGANELLA MORGANII   Serratia marcescens - MIC*    CEFEPIME Value in next row Sensitive      <=4 SENSITIVEThis is a modified FDA-approved test that has been validated and its performance characteristics determined by the reporting laboratory.  This laboratory is certified under the Clinical Laboratory Improvement Amendments CLIA as qualified to perform high complexity clinical laboratory testing.    ERTAPENEM Value in next row Sensitive      <=4 SENSITIVEThis is a modified FDA-approved test that has been validated and its performance characteristics determined by the reporting laboratory.  This laboratory is certified under the Clinical Laboratory Improvement Amendments CLIA as qualified to perform high complexity clinical laboratory testing.    CEFTRIAXONE Value in next row Resistant      <=4 SENSITIVEThis is a modified FDA-approved test that has been validated and its performance characteristics determined  by the reporting  laboratory.  This laboratory is certified under the Clinical Laboratory Improvement Amendments CLIA as qualified to perform high complexity clinical laboratory testing.    CIPROFLOXACIN Value in next row Sensitive      <=4 SENSITIVEThis is a modified FDA-approved test that has been validated and its performance characteristics determined by the reporting laboratory.  This laboratory is certified under the Clinical Laboratory Improvement Amendments CLIA as qualified to perform high complexity clinical laboratory testing.    GENTAMICIN Value in next row Sensitive      <=4 SENSITIVEThis is a modified FDA-approved test that has been validated and its performance characteristics determined by the reporting laboratory.  This laboratory is certified under the Clinical Laboratory Improvement Amendments CLIA as qualified to perform high complexity clinical laboratory testing.    NITROFURANTOIN Value in next row Resistant      <=4 SENSITIVEThis is a modified FDA-approved test that has been validated and its performance characteristics determined by the reporting laboratory.  This laboratory is certified under the Clinical Laboratory Improvement Amendments CLIA as qualified to perform high complexity clinical laboratory testing.    TRIMETH/SULFA Value in next row Sensitive      <=4 SENSITIVEThis is a modified FDA-approved test that has been validated and its performance characteristics determined by the reporting laboratory.  This laboratory is certified under the Clinical Laboratory Improvement Amendments CLIA as qualified to perform high complexity clinical laboratory testing.    MEROPENEM Value in next row Sensitive      <=4 SENSITIVEThis is a modified FDA-approved test that has been validated and its performance characteristics determined by the reporting laboratory.  This laboratory is certified under the Clinical Laboratory Improvement Amendments CLIA as qualified to perform high complexity  clinical laboratory testing.    * >=100,000 COLONIES/mL SERRATIA MARCESCENS  Blood culture (routine x 2)     Status: None (Preliminary result)   Collection Time: 05/26/24  5:06 AM   Specimen: BLOOD RIGHT ARM  Result Value Ref Range Status   Specimen Description   Final    BLOOD RIGHT ARM Performed at [facility], [address], [place], [place] [address]    Special Requests   Final    BOTTLES DRAWN AEROBIC ONLY Blood Culture results may not be optimal due to an inadequate volume of blood received in culture bottles Performed at [facility], [address], [place], [place] [address]    Culture   Final    NO GROWTH 4 DAYS Performed at [facility], [address], [place], [place] [address]    Report Status PENDING  Incomplete  MRSA Next Gen by PCR, Nasal     Status: Abnormal   Collection Time: 05/26/24  1:14 PM   Specimen: Nasal Mucosa; Nasal Swab  Result Value Ref Range Status   MRSA by PCR Next Gen DETECTED (A) NOT DETECTED Final    Comment: (NOTE) The GeneXpert MRSA Assay (FDA approved for NASAL specimens only), is one component of a comprehensive MRSA colonization surveillance program. It is not intended to diagnose MRSA infection nor to guide or monitor treatment for MRSA infections. Test performance is not FDA approved in patients less than 63 years old. Performed at [facility], [address] [place], [place] [address]      Labs: Basic Metabolic Panel: Recent Labs  Lab 05/25/24 2241 05/26/24 0506 05/27/24 0507 05/29/24 0447 05/30/24 0449  NA 131* 133* 139 137 137  K 4.1 4.0 3.7 3.9 3.8  CL  97* 98 104 104 103  CO2 23 23 22 24 25   GLUCOSE 149* 142* 158* 104* 94  BUN 18 16 13 16 13   CREATININE 0.54 0.61 0.59 0.56 0.60  CALCIUM  9.3 9.0 9.7 9.1 8.9   Liver Function Tests: Recent Labs  Lab 05/25/24 2241  AST 27  ALT 21  ALKPHOS 57  BILITOT 0.7  PROT 6.4*  ALBUMIN 4.1   Recent Labs  Lab  05/25/24 2241  LIPASE 28   No results for input(s): AMMONIA in the last 168 hours. CBC: Recent Labs  Lab 05/25/24 2241 05/26/24 0506 05/27/24 0507 05/29/24 0447 05/30/24 0449  WBC 6.3 6.4 5.2 4.6 4.4  NEUTROABS 4.1  --   --   --   --   HGB 10.1* 10.0* 11.4* 9.6* 9.5*  HCT 30.2* 30.8* 34.8* 30.1* 29.7*  MCV 77.6* 79.8* 79.1* 80.5 80.5  PLT 126* 109* 137* 132* 119*   Cardiac Enzymes: No results for input(s): CKTOTAL, CKMB, CKMBINDEX, TROPONINI in the last 168 hours. BNP: BNP (last 3 results) No results for input(s): BNP in the last 8760 hours.  ProBNP (last 3 results) No results for input(s): PROBNP in the last 8760 hours.  CBG: Recent Labs  Lab 05/29/24 1145 05/29/24 1705 05/29/24 2144 05/30/24 0745 05/30/24 1222  GLUCAP 114* 157* 142* 82 110*       Signed:  Toribio Hummer MD.  [facility] 05/30/2024, 2:34 PM

## 2024-05-30 NOTE — Progress Notes (Signed)
 Mobility Specialist - Progress Note   05/30/24 1255  Mobility  Activity Pivoted/transferred from bed to chair  Level of Assistance Standby assist, set-up cues, supervision of patient - no hands on  Assistive Device None  Range of Motion/Exercises Active  Activity Response Tolerated well  Mobility Referral Yes  Mobility visit 1 Mobility  Mobility Specialist Start Time (ACUTE ONLY) 1245  Mobility Specialist Stop Time (ACUTE ONLY) 1255  Mobility Specialist Time Calculation (min) (ACUTE ONLY) 10 min   Pt was found sitting EOB and agreeable to mobilize. No complaints and was left on recliner chair with all needs met. Call bell in reach.   Erminio Leos,  Mobility Specialist Can be reached via Secure Chat

## 2024-05-31 LAB — CULTURE, BLOOD (ROUTINE X 2)
Culture: NO GROWTH
Culture: NO GROWTH
Special Requests: ADEQUATE
